# Patient Record
Sex: Male | Born: 1980 | Hispanic: Refuse to answer | Marital: Married | Smoking: Current every day smoker
Health system: Southern US, Community
[De-identification: ages and names within clinical notes are randomized; demographics above are authoritative.]

## PROBLEM LIST (undated history)

## (undated) DIAGNOSIS — F32A Depression, unspecified: Secondary | ICD-10-CM

## (undated) DIAGNOSIS — G839 Paralytic syndrome, unspecified: Secondary | ICD-10-CM

## (undated) DIAGNOSIS — G0490 Encephalitis and encephalomyelitis, unspecified: Secondary | ICD-10-CM

## (undated) DIAGNOSIS — I639 Cerebral infarction, unspecified: Secondary | ICD-10-CM

## (undated) DIAGNOSIS — F329 Major depressive disorder, single episode, unspecified: Secondary | ICD-10-CM

## (undated) DIAGNOSIS — G039 Meningitis, unspecified: Secondary | ICD-10-CM

## (undated) DIAGNOSIS — I10 Essential (primary) hypertension: Secondary | ICD-10-CM

## (undated) HISTORY — PX: JOINT REPLACEMENT: SHX530

## (undated) HISTORY — PX: KNEE SURGERY: SHX244

## (undated) NOTE — *Deleted (*Deleted)
Family Medicine Teaching [facility] Admission History and Physical Service Pager: [phone]  Patient name: [name] Medical record number: [id number] Date of birth: 1980-11-27 Age: 40 y.o. Gender: male  Primary Care Provider: Verlon Au, MD Consultants: *** Code Status: *** Preferred Emergency Contact: ***  Chief Complaint: ***  Assessment and Plan: [name] is a 53 y.o. male presenting with *** . PMH is significant for ***  ***  FEN/GI: *** Prophylaxis: ***  Disposition: ***  History of Present Illness:  [name] is a 36 y.o. male presenting with ***  Pt started having symptoms three years ago.   Draining puss since 3am this morning.  He woke up in significant pain. Pt has not had any fevers or chills at home.  Denies headache, n/v.  endorrses constipation. Pain is 6/10 currently.     Pt loses his balance and falls. Last fell two months ago.     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    Takes tylenol as needed. Dr. Leavy Cella prescribes his lisinorpil for HTN  Has HTN but   Pt states he has had 13 knee surgeries, 9 here at [facility] for infections and other things.    Currently staying with 'godsister'.  Lives in an apartment.  He drinks moonshine 'PRN", aka once or twice a year.  He smokes 3-4 cigarettes daily for 25 years.  He smokes marijuana every other day.   Review Of Systems: Per HPI with the following additions: ***  ROS  Patient Active Problem List   Diagnosis Date Noted  . Leukocytosis 11/17/2018  . Osteomyelitis (HCC) 11/16/2018  . HCAP (healthcare-associated pneumonia) 06/28/2013  . Chest pain, atypical 06/28/2013  . SIRS (systemic inflammatory response syndrome) (HCC) 06/28/2013  . Hyponatremia 06/28/2013  . Headache 01/05/2013  . Leg pain 01/05/2013  . Dizziness 01/05/2013  . 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 CIGARETTE SMOKER 06/10/2007  . DEPRESSION 06/10/2007  .  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 POSTTRAUMATIC WOUND INFECTION NEC 06/10/2007    Past Medical History: Past Medical History:  Diagnosis Date  . Depression   . Encephalitis   . Hypertension   . Meningitis   . 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 Stroke [facility])     Past Surgical History: Past Surgical History:  Procedure Laterality Date  . JOINT REPLACEMENT    . KNEE SURGERY      Social History: Social History   Tobacco Use  . Smoking status: Current Every Day Smoker    Packs/day: 0.25  . Smokeless tobacco: Never Used  Vaping Use  . Vaping Use: Never used  Substance Use Topics  . Alcohol use: No  . Drug use: Yes    Types: Marijuana    Comment: not used in 3 weeks   Additional social history: ***  Please also refer to relevant sections of EMR.  Family History: Family History  Problem Relation Age of Onset  . Hypertension Mother   . Hypertension Father    (***If not completed, MUST add something in)  Allergies and Medications: Allergies  Allergen Reactions  . Vancomycin Hives    Itching and body hives within minutes of initiating Vancomycin infusion  . Ciprofloxacin Other (See Comments)    Reaction=hypertension  . Hydrocodone-Acetaminophen Hives  . Ibuprofen Hives  . Penicillins Hives and Nausea Only    Did it involve swelling of  the face/tongue/throat, SOB, or low BP? N Did it involve sudden or severe rash/hives, skin peeling, or any reaction on the inside of your mouth or nose? N Did you need to seek medical attention at a hospital or doctor's office? N When did it last happen?3 months ago If all above answers are "NO", may proceed with cephalosporin use.   Marland Kitchen Propoxyphene N-Acetaminophen Hives  . Tramadol Hcl Hives   No current facility-administered medications on file prior to encounter.   Current Outpatient Medications on File Prior to  Encounter  Medication Sig Dispense Refill  . amoxicillin-clavulanate (AUGMENTIN) 875-125 MG tablet Take 1 tablet by mouth every 12 (twelve) hours. (Patient not taking: Reported on 11/16/2018) 14 tablet 0  . cyclobenzaprine (FLEXERIL) 10 MG tablet Take 10 mg by mouth daily as needed for muscle spasms.     Marland Kitchen lisinopril (ZESTRIL) 10 MG tablet Take 10 mg by mouth daily.     . naproxen sodium (ANAPROX) 220 MG tablet Take 440 mg by mouth 2 (two) times daily as needed. For pain    . oxybutynin (DITROPAN) 5 MG tablet Take 5 mg by mouth daily.     . penicillin v potassium (VEETID) 500 MG tablet Take 1 tablet (500 mg total) by mouth 4 (four) times daily. (Patient not taking: Reported on 11/16/2018) 40 tablet 0    Objective: BP 101/73 (BP Location: Right Arm)   Pulse (!) 106   Temp 99.9 F (37.7 C) (Oral)   Resp 18   Ht 6\' 1"  (1.854 m)   Wt 89.4 kg   SpO2 98%   BMI 26.00 kg/m  Exam: General: *** Eyes: *** ENTM: *** Neck: *** Cardiovascular: *** Respiratory: *** Gastrointestinal: *** MSK: *** Derm: *** Neuro: *** Psych: ***  Labs and Imaging: CBC BMET  Recent Labs  Lab 01/06/20 1116  WBC 28.2*  HGB 15.1  HCT 45.3  PLT 444*   Recent Labs  Lab 01/06/20 1116  NA 135  K 3.9  CL 97*  CO2 25  BUN 11  CREATININE 1.00  GLUCOSE 107*  CALCIUM 9.8     EKG: ***  ***  Sandre Kitty, MD 01/06/2020, 3:46 PM PGY-***, [facility] Family Medicine FPTS Intern pager: [phone], text pages welcome

---
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
## 1999-08-06 ENCOUNTER — Emergency Department (HOSPITAL_COMMUNITY): Admission: EM | Admit: 1999-08-06 | Discharge: 1999-08-06 | Payer: Self-pay | Admitting: Emergency Medicine

## 1999-08-06 ENCOUNTER — Encounter: Payer: Self-pay | Admitting: Emergency Medicine

## 1999-08-21 ENCOUNTER — Emergency Department (HOSPITAL_COMMUNITY): Admission: EM | Admit: 1999-08-21 | Discharge: 1999-08-22 | Payer: Self-pay | Admitting: Emergency Medicine

## 1999-09-19 ENCOUNTER — Emergency Department (HOSPITAL_COMMUNITY): Admission: EM | Admit: 1999-09-19 | Discharge: 1999-09-19 | Payer: Self-pay | Admitting: Emergency Medicine

## 1999-09-19 ENCOUNTER — Encounter: Payer: Self-pay | Admitting: Emergency Medicine

## 1999-10-27 ENCOUNTER — Emergency Department (HOSPITAL_COMMUNITY): Admission: EM | Admit: 1999-10-27 | Discharge: 1999-10-27 | Payer: Self-pay | Admitting: *Deleted

## 2000-10-16 ENCOUNTER — Emergency Department (HOSPITAL_COMMUNITY): Admission: EM | Admit: 2000-10-16 | Discharge: 2000-10-16 | Payer: Self-pay | Admitting: Emergency Medicine

## 2000-10-16 ENCOUNTER — Encounter: Payer: Self-pay | Admitting: Emergency Medicine

## 2000-10-19 ENCOUNTER — Encounter: Payer: Self-pay | Admitting: Emergency Medicine

## 2000-10-19 ENCOUNTER — Emergency Department (HOSPITAL_COMMUNITY): Admission: EM | Admit: 2000-10-19 | Discharge: 2000-10-19 | Payer: Self-pay | Admitting: Emergency Medicine

## 2000-10-21 ENCOUNTER — Encounter (INDEPENDENT_AMBULATORY_CARE_PROVIDER_SITE_OTHER): Payer: Self-pay | Admitting: Specialist

## 2000-10-21 ENCOUNTER — Ambulatory Visit (HOSPITAL_BASED_OUTPATIENT_CLINIC_OR_DEPARTMENT_OTHER): Admission: RE | Admit: 2000-10-21 | Discharge: 2000-10-21 | Payer: Self-pay | Admitting: General Surgery

## 2000-10-22 ENCOUNTER — Emergency Department (HOSPITAL_COMMUNITY): Admission: EM | Admit: 2000-10-22 | Discharge: 2000-10-22 | Payer: Self-pay | Admitting: Emergency Medicine

## 2000-10-23 ENCOUNTER — Emergency Department (HOSPITAL_COMMUNITY): Admission: EM | Admit: 2000-10-23 | Discharge: 2000-10-23 | Payer: Self-pay

## 2000-10-24 ENCOUNTER — Emergency Department (HOSPITAL_COMMUNITY): Admission: EM | Admit: 2000-10-24 | Discharge: 2000-10-24 | Payer: Self-pay | Admitting: Emergency Medicine

## 2001-06-30 ENCOUNTER — Emergency Department (HOSPITAL_COMMUNITY): Admission: EM | Admit: 2001-06-30 | Discharge: 2001-06-30 | Payer: Self-pay | Admitting: Emergency Medicine

## 2001-07-01 ENCOUNTER — Encounter: Payer: Self-pay | Admitting: Emergency Medicine

## 2001-07-28 ENCOUNTER — Emergency Department (HOSPITAL_COMMUNITY): Admission: EM | Admit: 2001-07-28 | Discharge: 2001-07-28 | Payer: Self-pay | Admitting: Emergency Medicine

## 2002-04-07 ENCOUNTER — Inpatient Hospital Stay (HOSPITAL_COMMUNITY): Admission: EM | Admit: 2002-04-07 | Discharge: 2002-04-13 | Payer: Self-pay | Admitting: Psychiatry

## 2002-07-20 ENCOUNTER — Emergency Department (HOSPITAL_COMMUNITY): Admission: EM | Admit: 2002-07-20 | Discharge: 2002-07-20 | Payer: Self-pay | Admitting: Emergency Medicine

## 2003-01-13 ENCOUNTER — Ambulatory Visit (HOSPITAL_COMMUNITY): Admission: RE | Admit: 2003-01-13 | Discharge: 2003-01-13 | Payer: Self-pay | Admitting: Orthopedic Surgery

## 2003-01-13 ENCOUNTER — Encounter (INDEPENDENT_AMBULATORY_CARE_PROVIDER_SITE_OTHER): Payer: Self-pay | Admitting: *Deleted

## 2003-01-13 ENCOUNTER — Ambulatory Visit (HOSPITAL_BASED_OUTPATIENT_CLINIC_OR_DEPARTMENT_OTHER): Admission: RE | Admit: 2003-01-13 | Discharge: 2003-01-13 | Payer: Self-pay | Admitting: Orthopedic Surgery

## 2003-03-19 ENCOUNTER — Emergency Department (HOSPITAL_COMMUNITY): Admission: EM | Admit: 2003-03-19 | Discharge: 2003-03-19 | Payer: Self-pay | Admitting: Emergency Medicine

## 2003-09-29 ENCOUNTER — Inpatient Hospital Stay (HOSPITAL_COMMUNITY): Admission: RE | Admit: 2003-09-29 | Discharge: 2003-10-03 | Payer: Self-pay | Admitting: Orthopedic Surgery

## 2003-09-29 ENCOUNTER — Encounter (INDEPENDENT_AMBULATORY_CARE_PROVIDER_SITE_OTHER): Payer: Self-pay | Admitting: Specialist

## 2003-12-01 ENCOUNTER — Encounter: Admission: RE | Admit: 2003-12-01 | Discharge: 2003-12-01 | Payer: Self-pay | Admitting: Family Medicine

## 2003-12-11 ENCOUNTER — Emergency Department (HOSPITAL_COMMUNITY): Admission: EM | Admit: 2003-12-11 | Discharge: 2003-12-11 | Payer: Self-pay | Admitting: Emergency Medicine

## 2003-12-27 ENCOUNTER — Emergency Department (HOSPITAL_COMMUNITY): Admission: EM | Admit: 2003-12-27 | Discharge: 2003-12-27 | Payer: Self-pay | Admitting: Emergency Medicine

## 2004-02-24 ENCOUNTER — Emergency Department (HOSPITAL_COMMUNITY): Admission: EM | Admit: 2004-02-24 | Discharge: 2004-02-25 | Payer: Self-pay

## 2004-03-07 ENCOUNTER — Emergency Department (HOSPITAL_COMMUNITY): Admission: EM | Admit: 2004-03-07 | Discharge: 2004-03-07 | Payer: Self-pay | Admitting: Emergency Medicine

## 2004-05-15 ENCOUNTER — Ambulatory Visit (HOSPITAL_COMMUNITY): Admission: RE | Admit: 2004-05-15 | Discharge: 2004-05-16 | Payer: Self-pay | Admitting: Orthopaedic Surgery

## 2004-05-21 ENCOUNTER — Emergency Department (HOSPITAL_COMMUNITY): Admission: EM | Admit: 2004-05-21 | Discharge: 2004-05-21 | Payer: Self-pay | Admitting: Emergency Medicine

## 2004-05-27 ENCOUNTER — Emergency Department (HOSPITAL_COMMUNITY): Admission: EM | Admit: 2004-05-27 | Discharge: 2004-05-27 | Payer: Self-pay | Admitting: Emergency Medicine

## 2004-05-29 ENCOUNTER — Emergency Department (HOSPITAL_COMMUNITY): Admission: EM | Admit: 2004-05-29 | Discharge: 2004-05-29 | Payer: Self-pay | Admitting: Emergency Medicine

## 2004-06-05 ENCOUNTER — Emergency Department (HOSPITAL_COMMUNITY): Admission: EM | Admit: 2004-06-05 | Discharge: 2004-06-06 | Payer: Self-pay | Admitting: Emergency Medicine

## 2004-06-07 ENCOUNTER — Inpatient Hospital Stay (HOSPITAL_COMMUNITY): Admission: EM | Admit: 2004-06-07 | Discharge: 2004-06-10 | Payer: Self-pay | Admitting: Emergency Medicine

## 2004-06-14 ENCOUNTER — Emergency Department (HOSPITAL_COMMUNITY): Admission: EM | Admit: 2004-06-14 | Discharge: 2004-06-14 | Payer: Self-pay | Admitting: Emergency Medicine

## 2004-06-23 ENCOUNTER — Emergency Department (HOSPITAL_COMMUNITY): Admission: EM | Admit: 2004-06-23 | Discharge: 2004-06-23 | Payer: Self-pay | Admitting: Emergency Medicine

## 2004-07-20 ENCOUNTER — Emergency Department (HOSPITAL_COMMUNITY): Admission: EM | Admit: 2004-07-20 | Discharge: 2004-07-21 | Payer: Self-pay | Admitting: Emergency Medicine

## 2004-08-03 ENCOUNTER — Emergency Department (HOSPITAL_COMMUNITY): Admission: EM | Admit: 2004-08-03 | Discharge: 2004-08-03 | Payer: Self-pay | Admitting: Family Medicine

## 2004-08-04 ENCOUNTER — Emergency Department (HOSPITAL_COMMUNITY): Admission: EM | Admit: 2004-08-04 | Discharge: 2004-08-04 | Payer: Self-pay | Admitting: Emergency Medicine

## 2004-08-06 ENCOUNTER — Emergency Department (HOSPITAL_COMMUNITY): Admission: EM | Admit: 2004-08-06 | Discharge: 2004-08-06 | Payer: Self-pay | Admitting: Family Medicine

## 2004-08-17 ENCOUNTER — Emergency Department (HOSPITAL_COMMUNITY): Admission: EM | Admit: 2004-08-17 | Discharge: 2004-08-17 | Payer: Self-pay | Admitting: Emergency Medicine

## 2004-08-18 ENCOUNTER — Emergency Department (HOSPITAL_COMMUNITY): Admission: EM | Admit: 2004-08-18 | Discharge: 2004-08-18 | Payer: Self-pay | Admitting: Emergency Medicine

## 2004-08-21 ENCOUNTER — Emergency Department (HOSPITAL_COMMUNITY): Admission: EM | Admit: 2004-08-21 | Discharge: 2004-08-21 | Payer: Self-pay | Admitting: Emergency Medicine

## 2004-11-03 ENCOUNTER — Emergency Department (HOSPITAL_COMMUNITY): Admission: EM | Admit: 2004-11-03 | Discharge: 2004-11-03 | Payer: Self-pay | Admitting: Family Medicine

## 2004-11-22 ENCOUNTER — Emergency Department (HOSPITAL_COMMUNITY): Admission: EM | Admit: 2004-11-22 | Discharge: 2004-11-23 | Payer: Self-pay | Admitting: Emergency Medicine

## 2004-12-06 ENCOUNTER — Emergency Department (HOSPITAL_COMMUNITY): Admission: EM | Admit: 2004-12-06 | Discharge: 2004-12-06 | Payer: Self-pay | Admitting: Emergency Medicine

## 2004-12-23 ENCOUNTER — Emergency Department (HOSPITAL_COMMUNITY): Admission: EM | Admit: 2004-12-23 | Discharge: 2004-12-23 | Payer: Self-pay | Admitting: Emergency Medicine

## 2005-01-25 ENCOUNTER — Emergency Department (HOSPITAL_COMMUNITY): Admission: EM | Admit: 2005-01-25 | Discharge: 2005-01-25 | Payer: Self-pay | Admitting: Emergency Medicine

## 2005-03-03 ENCOUNTER — Emergency Department (HOSPITAL_COMMUNITY): Admission: EM | Admit: 2005-03-03 | Discharge: 2005-03-04 | Payer: Self-pay | Admitting: Emergency Medicine

## 2005-03-29 ENCOUNTER — Emergency Department (HOSPITAL_COMMUNITY): Admission: EM | Admit: 2005-03-29 | Discharge: 2005-03-29 | Payer: Self-pay | Admitting: Emergency Medicine

## 2005-04-05 ENCOUNTER — Emergency Department (HOSPITAL_COMMUNITY): Admission: EM | Admit: 2005-04-05 | Discharge: 2005-04-05 | Payer: Self-pay | Admitting: Emergency Medicine

## 2005-04-08 ENCOUNTER — Emergency Department (HOSPITAL_COMMUNITY): Admission: EM | Admit: 2005-04-08 | Discharge: 2005-04-08 | Payer: Self-pay | Admitting: Emergency Medicine

## 2005-04-13 ENCOUNTER — Emergency Department (HOSPITAL_COMMUNITY): Admission: EM | Admit: 2005-04-13 | Discharge: 2005-04-13 | Payer: Self-pay | Admitting: Emergency Medicine

## 2005-05-05 ENCOUNTER — Emergency Department (HOSPITAL_COMMUNITY): Admission: EM | Admit: 2005-05-05 | Discharge: 2005-05-05 | Payer: Self-pay | Admitting: Emergency Medicine

## 2005-07-12 ENCOUNTER — Encounter: Admission: RE | Admit: 2005-07-12 | Discharge: 2005-07-12 | Payer: Self-pay | Admitting: Family Medicine

## 2005-07-21 ENCOUNTER — Emergency Department (HOSPITAL_COMMUNITY): Admission: EM | Admit: 2005-07-21 | Discharge: 2005-07-21 | Payer: Self-pay | Admitting: *Deleted

## 2005-07-25 ENCOUNTER — Encounter: Admission: RE | Admit: 2005-07-25 | Discharge: 2005-07-25 | Payer: Self-pay | Admitting: Orthopaedic Surgery

## 2005-08-05 ENCOUNTER — Emergency Department (HOSPITAL_COMMUNITY): Admission: EM | Admit: 2005-08-05 | Discharge: 2005-08-05 | Payer: Self-pay | Admitting: Emergency Medicine

## 2005-12-10 ENCOUNTER — Ambulatory Visit (HOSPITAL_COMMUNITY): Admission: RE | Admit: 2005-12-10 | Discharge: 2005-12-10 | Payer: Self-pay | Admitting: Orthopaedic Surgery

## 2006-05-13 ENCOUNTER — Inpatient Hospital Stay (HOSPITAL_COMMUNITY): Admission: RE | Admit: 2006-05-13 | Discharge: 2006-05-19 | Payer: Self-pay | Admitting: Orthopaedic Surgery

## 2006-07-08 ENCOUNTER — Emergency Department (HOSPITAL_COMMUNITY): Admission: EM | Admit: 2006-07-08 | Discharge: 2006-07-08 | Payer: Self-pay | Admitting: Emergency Medicine

## 2006-10-22 ENCOUNTER — Emergency Department (HOSPITAL_COMMUNITY): Admission: EM | Admit: 2006-10-22 | Discharge: 2006-10-22 | Payer: Self-pay | Admitting: Emergency Medicine

## 2006-10-26 ENCOUNTER — Emergency Department (HOSPITAL_COMMUNITY): Admission: EM | Admit: 2006-10-26 | Discharge: 2006-10-27 | Payer: Self-pay | Admitting: Emergency Medicine

## 2006-10-29 ENCOUNTER — Emergency Department (HOSPITAL_COMMUNITY): Admission: EM | Admit: 2006-10-29 | Discharge: 2006-10-29 | Payer: Self-pay | Admitting: Emergency Medicine

## 2006-10-31 ENCOUNTER — Inpatient Hospital Stay (HOSPITAL_COMMUNITY): Admission: AD | Admit: 2006-10-31 | Discharge: 2006-11-02 | Payer: Self-pay | Admitting: Psychiatry

## 2006-10-31 ENCOUNTER — Ambulatory Visit: Payer: Self-pay | Admitting: Psychiatry

## 2006-11-14 ENCOUNTER — Emergency Department (HOSPITAL_COMMUNITY): Admission: EM | Admit: 2006-11-14 | Discharge: 2006-11-14 | Payer: Self-pay | Admitting: Emergency Medicine

## 2006-11-18 ENCOUNTER — Observation Stay (HOSPITAL_COMMUNITY): Admission: RE | Admit: 2006-11-18 | Discharge: 2006-11-19 | Payer: Self-pay | Admitting: Orthopaedic Surgery

## 2006-11-21 ENCOUNTER — Emergency Department (HOSPITAL_COMMUNITY): Admission: EM | Admit: 2006-11-21 | Discharge: 2006-11-21 | Payer: Self-pay | Admitting: Emergency Medicine

## 2006-11-25 ENCOUNTER — Emergency Department (HOSPITAL_COMMUNITY): Admission: EM | Admit: 2006-11-25 | Discharge: 2006-11-26 | Payer: Self-pay | Admitting: Emergency Medicine

## 2006-11-29 ENCOUNTER — Emergency Department (HOSPITAL_COMMUNITY): Admission: EM | Admit: 2006-11-29 | Discharge: 2006-11-30 | Payer: Self-pay | Admitting: Emergency Medicine

## 2006-11-29 ENCOUNTER — Emergency Department (HOSPITAL_COMMUNITY): Admission: EM | Admit: 2006-11-29 | Discharge: 2006-11-29 | Payer: Self-pay | Admitting: Emergency Medicine

## 2006-11-30 ENCOUNTER — Emergency Department (HOSPITAL_COMMUNITY): Admission: EM | Admit: 2006-11-30 | Discharge: 2006-11-30 | Payer: Self-pay | Admitting: Emergency Medicine

## 2006-12-11 ENCOUNTER — Emergency Department (HOSPITAL_COMMUNITY): Admission: EM | Admit: 2006-12-11 | Discharge: 2006-12-11 | Payer: Self-pay | Admitting: Emergency Medicine

## 2006-12-13 ENCOUNTER — Observation Stay (HOSPITAL_COMMUNITY): Admission: EM | Admit: 2006-12-13 | Discharge: 2006-12-13 | Payer: Self-pay | Admitting: Emergency Medicine

## 2006-12-15 ENCOUNTER — Emergency Department (HOSPITAL_COMMUNITY): Admission: EM | Admit: 2006-12-15 | Discharge: 2006-12-15 | Payer: Self-pay | Admitting: Emergency Medicine

## 2006-12-21 ENCOUNTER — Emergency Department (HOSPITAL_COMMUNITY): Admission: EM | Admit: 2006-12-21 | Discharge: 2006-12-21 | Payer: Self-pay | Admitting: Emergency Medicine

## 2006-12-22 ENCOUNTER — Emergency Department (HOSPITAL_COMMUNITY): Admission: EM | Admit: 2006-12-22 | Discharge: 2006-12-23 | Payer: Self-pay | Admitting: Emergency Medicine

## 2006-12-22 ENCOUNTER — Emergency Department (HOSPITAL_COMMUNITY): Admission: EM | Admit: 2006-12-22 | Discharge: 2006-12-22 | Payer: Self-pay | Admitting: Emergency Medicine

## 2006-12-23 ENCOUNTER — Emergency Department (HOSPITAL_COMMUNITY): Admission: EM | Admit: 2006-12-23 | Discharge: 2006-12-23 | Payer: Self-pay | Admitting: Emergency Medicine

## 2006-12-25 ENCOUNTER — Emergency Department (HOSPITAL_COMMUNITY): Admission: EM | Admit: 2006-12-25 | Discharge: 2006-12-25 | Payer: Self-pay | Admitting: Emergency Medicine

## 2006-12-29 ENCOUNTER — Emergency Department (HOSPITAL_COMMUNITY): Admission: EM | Admit: 2006-12-29 | Discharge: 2006-12-29 | Payer: Self-pay | Admitting: Emergency Medicine

## 2007-01-02 ENCOUNTER — Emergency Department (HOSPITAL_COMMUNITY): Admission: EM | Admit: 2007-01-02 | Discharge: 2007-01-03 | Payer: Self-pay | Admitting: Emergency Medicine

## 2007-01-09 ENCOUNTER — Emergency Department (HOSPITAL_COMMUNITY): Admission: EM | Admit: 2007-01-09 | Discharge: 2007-01-09 | Payer: Self-pay | Admitting: *Deleted

## 2007-01-11 ENCOUNTER — Emergency Department (HOSPITAL_COMMUNITY): Admission: EM | Admit: 2007-01-11 | Discharge: 2007-01-11 | Payer: Self-pay | Admitting: Emergency Medicine

## 2007-01-16 ENCOUNTER — Emergency Department (HOSPITAL_COMMUNITY): Admission: EM | Admit: 2007-01-16 | Discharge: 2007-01-16 | Payer: Self-pay | Admitting: Emergency Medicine

## 2007-01-19 ENCOUNTER — Emergency Department (HOSPITAL_COMMUNITY): Admission: EM | Admit: 2007-01-19 | Discharge: 2007-01-20 | Payer: Self-pay | Admitting: Family Medicine

## 2007-01-20 ENCOUNTER — Emergency Department (HOSPITAL_COMMUNITY): Admission: EM | Admit: 2007-01-20 | Discharge: 2007-01-20 | Payer: Self-pay | Admitting: Emergency Medicine

## 2007-01-23 ENCOUNTER — Emergency Department (HOSPITAL_COMMUNITY): Admission: EM | Admit: 2007-01-23 | Discharge: 2007-01-23 | Payer: Self-pay | Admitting: Emergency Medicine

## 2007-01-29 ENCOUNTER — Emergency Department (HOSPITAL_COMMUNITY): Admission: EM | Admit: 2007-01-29 | Discharge: 2007-01-29 | Payer: Self-pay | Admitting: Emergency Medicine

## 2007-01-30 ENCOUNTER — Emergency Department (HOSPITAL_COMMUNITY): Admission: EM | Admit: 2007-01-30 | Discharge: 2007-01-30 | Payer: Self-pay | Admitting: Emergency Medicine

## 2007-02-01 ENCOUNTER — Other Ambulatory Visit: Payer: Self-pay | Admitting: Emergency Medicine

## 2007-02-01 ENCOUNTER — Other Ambulatory Visit: Payer: Self-pay | Admitting: *Deleted

## 2007-02-02 ENCOUNTER — Ambulatory Visit: Payer: Self-pay | Admitting: *Deleted

## 2007-02-02 ENCOUNTER — Inpatient Hospital Stay (HOSPITAL_COMMUNITY): Admission: EM | Admit: 2007-02-02 | Discharge: 2007-02-05 | Payer: Self-pay | Admitting: *Deleted

## 2007-02-11 ENCOUNTER — Emergency Department (HOSPITAL_COMMUNITY): Admission: EM | Admit: 2007-02-11 | Discharge: 2007-02-11 | Payer: Self-pay | Admitting: Emergency Medicine

## 2007-02-15 ENCOUNTER — Emergency Department (HOSPITAL_COMMUNITY): Admission: EM | Admit: 2007-02-15 | Discharge: 2007-02-15 | Payer: Self-pay | Admitting: Emergency Medicine

## 2007-02-16 ENCOUNTER — Other Ambulatory Visit: Payer: Self-pay | Admitting: Emergency Medicine

## 2007-02-16 ENCOUNTER — Inpatient Hospital Stay (HOSPITAL_COMMUNITY): Admission: EM | Admit: 2007-02-16 | Discharge: 2007-02-17 | Payer: Self-pay | Admitting: *Deleted

## 2007-02-16 ENCOUNTER — Ambulatory Visit: Payer: Self-pay | Admitting: *Deleted

## 2007-02-18 ENCOUNTER — Emergency Department (HOSPITAL_COMMUNITY): Admission: EM | Admit: 2007-02-18 | Discharge: 2007-02-18 | Payer: Self-pay | Admitting: Emergency Medicine

## 2007-02-22 ENCOUNTER — Emergency Department (HOSPITAL_COMMUNITY): Admission: EM | Admit: 2007-02-22 | Discharge: 2007-02-22 | Payer: Self-pay | Admitting: Emergency Medicine

## 2007-02-23 ENCOUNTER — Emergency Department (HOSPITAL_COMMUNITY): Admission: EM | Admit: 2007-02-23 | Discharge: 2007-02-23 | Payer: Self-pay | Admitting: Emergency Medicine

## 2007-02-26 ENCOUNTER — Emergency Department (HOSPITAL_COMMUNITY): Admission: EM | Admit: 2007-02-26 | Discharge: 2007-02-26 | Payer: Self-pay | Admitting: Emergency Medicine

## 2007-03-04 ENCOUNTER — Emergency Department (HOSPITAL_COMMUNITY): Admission: EM | Admit: 2007-03-04 | Discharge: 2007-03-04 | Payer: Self-pay | Admitting: Emergency Medicine

## 2007-03-06 ENCOUNTER — Emergency Department (HOSPITAL_COMMUNITY): Admission: EM | Admit: 2007-03-06 | Discharge: 2007-03-06 | Payer: Self-pay | Admitting: Emergency Medicine

## 2007-03-10 ENCOUNTER — Emergency Department (HOSPITAL_COMMUNITY): Admission: EM | Admit: 2007-03-10 | Discharge: 2007-03-10 | Payer: Self-pay | Admitting: Emergency Medicine

## 2007-03-27 ENCOUNTER — Emergency Department (HOSPITAL_COMMUNITY): Admission: EM | Admit: 2007-03-27 | Discharge: 2007-03-27 | Payer: Self-pay | Admitting: Emergency Medicine

## 2007-04-08 ENCOUNTER — Emergency Department (HOSPITAL_COMMUNITY): Admission: EM | Admit: 2007-04-08 | Discharge: 2007-04-08 | Payer: Self-pay | Admitting: Emergency Medicine

## 2007-04-15 ENCOUNTER — Emergency Department (HOSPITAL_COMMUNITY): Admission: EM | Admit: 2007-04-15 | Discharge: 2007-04-15 | Payer: Self-pay | Admitting: Emergency Medicine

## 2007-04-28 ENCOUNTER — Encounter (HOSPITAL_BASED_OUTPATIENT_CLINIC_OR_DEPARTMENT_OTHER): Admission: RE | Admit: 2007-04-28 | Discharge: 2007-07-27 | Payer: Self-pay | Admitting: Surgery

## 2007-04-30 ENCOUNTER — Encounter: Payer: Self-pay | Admitting: Internal Medicine

## 2007-05-01 ENCOUNTER — Emergency Department (HOSPITAL_COMMUNITY): Admission: EM | Admit: 2007-05-01 | Discharge: 2007-05-01 | Payer: Self-pay | Admitting: Emergency Medicine

## 2007-05-11 ENCOUNTER — Encounter: Payer: Self-pay | Admitting: Internal Medicine

## 2007-05-14 ENCOUNTER — Encounter: Payer: Self-pay | Admitting: Internal Medicine

## 2007-05-19 ENCOUNTER — Emergency Department (HOSPITAL_COMMUNITY): Admission: EM | Admit: 2007-05-19 | Discharge: 2007-05-19 | Payer: Self-pay | Admitting: Emergency Medicine

## 2007-05-20 ENCOUNTER — Emergency Department (HOSPITAL_COMMUNITY): Admission: EM | Admit: 2007-05-20 | Discharge: 2007-05-20 | Payer: Self-pay | Admitting: Emergency Medicine

## 2007-05-26 ENCOUNTER — Emergency Department (HOSPITAL_COMMUNITY): Admission: EM | Admit: 2007-05-26 | Discharge: 2007-05-26 | Payer: Self-pay | Admitting: Emergency Medicine

## 2007-05-28 ENCOUNTER — Encounter: Admission: RE | Admit: 2007-05-28 | Discharge: 2007-05-28 | Payer: Self-pay | Admitting: Surgery

## 2007-05-30 ENCOUNTER — Emergency Department (HOSPITAL_COMMUNITY): Admission: EM | Admit: 2007-05-30 | Discharge: 2007-05-30 | Payer: Self-pay | Admitting: Emergency Medicine

## 2007-06-04 ENCOUNTER — Emergency Department (HOSPITAL_COMMUNITY): Admission: EM | Admit: 2007-06-04 | Discharge: 2007-06-04 | Payer: Self-pay | Admitting: Emergency Medicine

## 2007-06-06 ENCOUNTER — Emergency Department (HOSPITAL_COMMUNITY): Admission: EM | Admit: 2007-06-06 | Discharge: 2007-06-07 | Payer: Self-pay | Admitting: Emergency Medicine

## 2007-06-08 ENCOUNTER — Emergency Department (HOSPITAL_COMMUNITY): Admission: EM | Admit: 2007-06-08 | Discharge: 2007-06-08 | Payer: Self-pay | Admitting: Emergency Medicine

## 2007-06-10 ENCOUNTER — Ambulatory Visit: Payer: Self-pay | Admitting: Internal Medicine

## 2007-06-10 DIAGNOSIS — G8110 Spastic hemiplegia affecting unspecified side: Secondary | ICD-10-CM | POA: Insufficient documentation

## 2007-06-10 DIAGNOSIS — G0491 Myelitis, unspecified: Secondary | ICD-10-CM

## 2007-06-10 DIAGNOSIS — F32A Depression, unspecified: Secondary | ICD-10-CM | POA: Insufficient documentation

## 2007-06-10 DIAGNOSIS — F17200 Nicotine dependence, unspecified, uncomplicated: Secondary | ICD-10-CM | POA: Insufficient documentation

## 2007-06-10 DIAGNOSIS — F1011 Alcohol abuse, in remission: Secondary | ICD-10-CM | POA: Insufficient documentation

## 2007-06-10 DIAGNOSIS — L089 Local infection of the skin and subcutaneous tissue, unspecified: Secondary | ICD-10-CM | POA: Insufficient documentation

## 2007-06-10 DIAGNOSIS — T148XXA Other injury of unspecified body region, initial encounter: Secondary | ICD-10-CM

## 2007-06-10 DIAGNOSIS — G0490 Encephalitis and encephalomyelitis, unspecified: Secondary | ICD-10-CM | POA: Insufficient documentation

## 2007-06-10 DIAGNOSIS — F329 Major depressive disorder, single episode, unspecified: Secondary | ICD-10-CM

## 2007-06-23 ENCOUNTER — Emergency Department: Payer: Self-pay | Admitting: Emergency Medicine

## 2007-06-23 ENCOUNTER — Emergency Department (HOSPITAL_COMMUNITY): Admission: EM | Admit: 2007-06-23 | Discharge: 2007-06-23 | Payer: Self-pay | Admitting: Emergency Medicine

## 2007-06-27 ENCOUNTER — Emergency Department (HOSPITAL_COMMUNITY): Admission: EM | Admit: 2007-06-27 | Discharge: 2007-06-27 | Payer: Self-pay | Admitting: Emergency Medicine

## 2007-07-06 ENCOUNTER — Emergency Department (HOSPITAL_COMMUNITY): Admission: EM | Admit: 2007-07-06 | Discharge: 2007-07-06 | Payer: Self-pay | Admitting: Emergency Medicine

## 2007-07-24 ENCOUNTER — Emergency Department (HOSPITAL_COMMUNITY): Admission: EM | Admit: 2007-07-24 | Discharge: 2007-07-24 | Payer: Self-pay | Admitting: Emergency Medicine

## 2007-07-28 ENCOUNTER — Ambulatory Visit: Payer: Self-pay | Admitting: Internal Medicine

## 2007-07-28 LAB — CONVERTED CEMR LAB
Bilirubin Urine: NEGATIVE
Glucose, Urine, Semiquant: NEGATIVE
WBC Urine, dipstick: NEGATIVE
pH: 5

## 2007-08-08 ENCOUNTER — Emergency Department (HOSPITAL_COMMUNITY): Admission: EM | Admit: 2007-08-08 | Discharge: 2007-08-08 | Payer: Self-pay | Admitting: Emergency Medicine

## 2007-08-10 ENCOUNTER — Emergency Department (HOSPITAL_COMMUNITY): Admission: EM | Admit: 2007-08-10 | Discharge: 2007-08-10 | Payer: Self-pay | Admitting: Emergency Medicine

## 2007-08-13 ENCOUNTER — Emergency Department (HOSPITAL_COMMUNITY): Admission: EM | Admit: 2007-08-13 | Discharge: 2007-08-13 | Payer: Self-pay | Admitting: Emergency Medicine

## 2007-08-19 ENCOUNTER — Emergency Department (HOSPITAL_COMMUNITY): Admission: EM | Admit: 2007-08-19 | Discharge: 2007-08-19 | Payer: Self-pay | Admitting: Emergency Medicine

## 2007-09-12 ENCOUNTER — Emergency Department (HOSPITAL_COMMUNITY): Admission: EM | Admit: 2007-09-12 | Discharge: 2007-09-12 | Payer: Self-pay | Admitting: Emergency Medicine

## 2007-09-14 ENCOUNTER — Emergency Department (HOSPITAL_COMMUNITY): Admission: EM | Admit: 2007-09-14 | Discharge: 2007-09-14 | Payer: Self-pay | Admitting: Emergency Medicine

## 2007-09-16 ENCOUNTER — Inpatient Hospital Stay (HOSPITAL_COMMUNITY): Admission: AD | Admit: 2007-09-16 | Discharge: 2007-09-20 | Payer: Self-pay | Admitting: *Deleted

## 2007-09-16 ENCOUNTER — Ambulatory Visit: Payer: Self-pay | Admitting: *Deleted

## 2007-10-13 ENCOUNTER — Emergency Department (HOSPITAL_COMMUNITY): Admission: EM | Admit: 2007-10-13 | Discharge: 2007-10-13 | Payer: Self-pay | Admitting: Emergency Medicine

## 2007-10-17 ENCOUNTER — Inpatient Hospital Stay (HOSPITAL_COMMUNITY): Admission: AD | Admit: 2007-10-17 | Discharge: 2007-10-19 | Payer: Self-pay | Admitting: *Deleted

## 2007-10-17 ENCOUNTER — Ambulatory Visit: Payer: Self-pay | Admitting: *Deleted

## 2007-10-25 ENCOUNTER — Emergency Department (HOSPITAL_COMMUNITY): Admission: EM | Admit: 2007-10-25 | Discharge: 2007-10-25 | Payer: Self-pay | Admitting: Emergency Medicine

## 2007-10-31 ENCOUNTER — Emergency Department (HOSPITAL_COMMUNITY): Admission: EM | Admit: 2007-10-31 | Discharge: 2007-10-31 | Payer: Self-pay | Admitting: *Deleted

## 2007-11-06 ENCOUNTER — Emergency Department (HOSPITAL_COMMUNITY): Admission: EM | Admit: 2007-11-06 | Discharge: 2007-11-06 | Payer: Self-pay | Admitting: Emergency Medicine

## 2007-12-12 ENCOUNTER — Emergency Department (HOSPITAL_COMMUNITY): Admission: EM | Admit: 2007-12-12 | Discharge: 2007-12-13 | Payer: Self-pay | Admitting: Emergency Medicine

## 2008-01-08 ENCOUNTER — Emergency Department (HOSPITAL_COMMUNITY): Admission: EM | Admit: 2008-01-08 | Discharge: 2008-01-08 | Payer: Self-pay | Admitting: Family Medicine

## 2008-01-14 ENCOUNTER — Other Ambulatory Visit: Payer: Self-pay

## 2008-01-15 ENCOUNTER — Ambulatory Visit: Payer: Self-pay | Admitting: *Deleted

## 2008-01-15 ENCOUNTER — Inpatient Hospital Stay (HOSPITAL_COMMUNITY): Admission: AD | Admit: 2008-01-15 | Discharge: 2008-01-19 | Payer: Self-pay | Admitting: *Deleted

## 2008-01-16 IMAGING — CT CT HEAD W/O CM
1 of 2 series · 16 of 30 positions shown, 20 images · IV contrast (agent unspecified)
Comparison: 12/15/06.

CLINICAL DATA: 26-year-old male with syncope, left arm and both leg numbness.  Headache. 
 HEAD CT WITHOUT CONTRAST:
TECHNIQUE: Contiguous axial images were obtained from the base of the skull through the vertex according to standard protocol without contrast.

[Series 102: brain · axial · 0.47mm/px · z∈[+145,+295]mm · 16 of 64 slices shown, 20 images]
[im 4/64  brain]
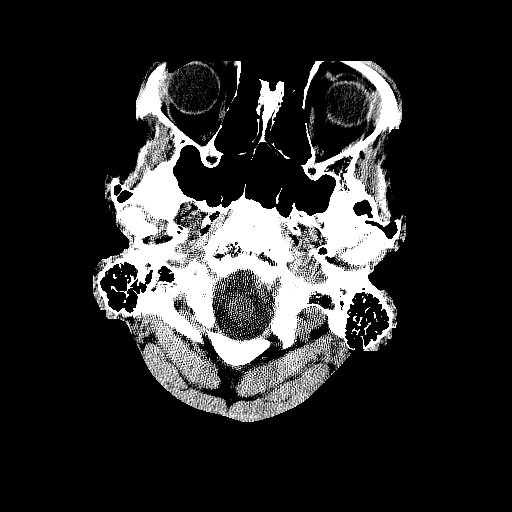
[im 4/64  bone]
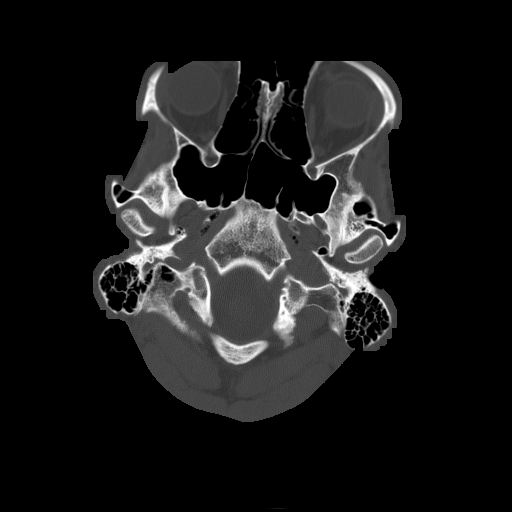
[im 7/64  brain]
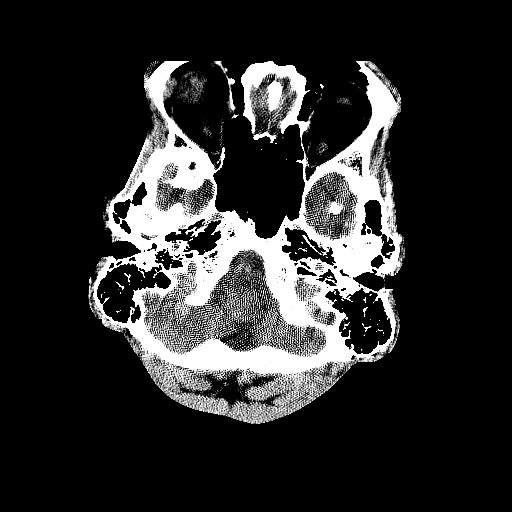
[im 10/64  brain]
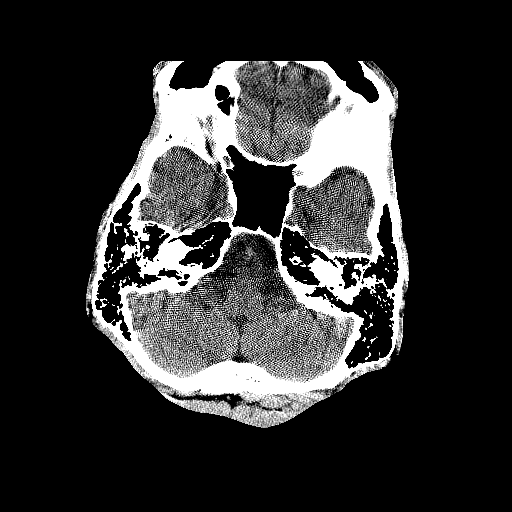
[im 14/64  brain]
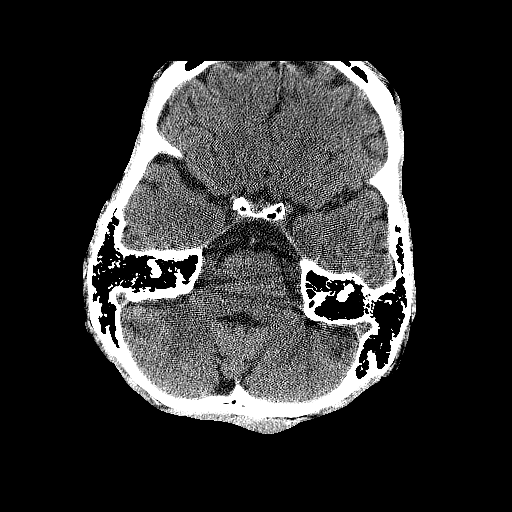
[im 20/64  brain]
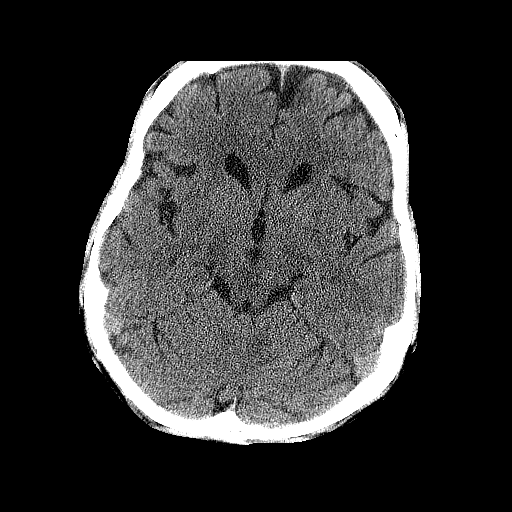
[im 20/64  bone]
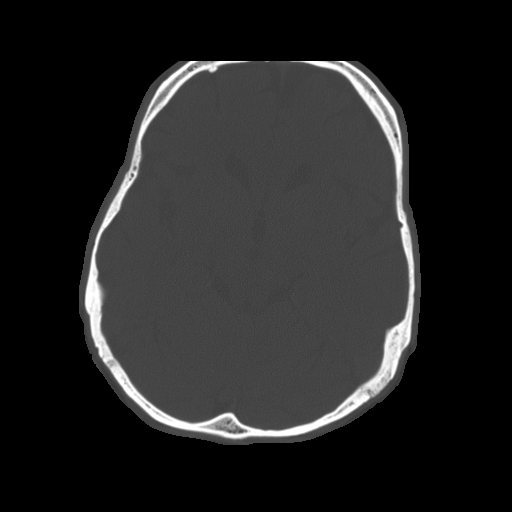
[im 24/64  brain]
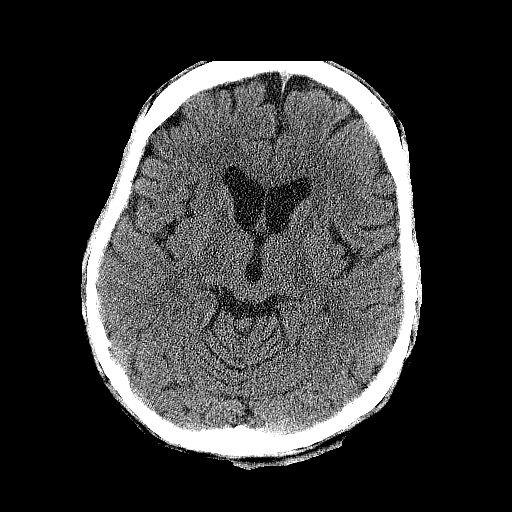
[im 27/64  brain]
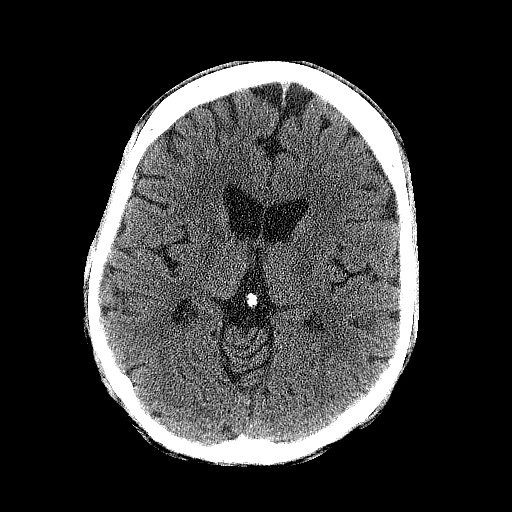
[im 30/64  brain]
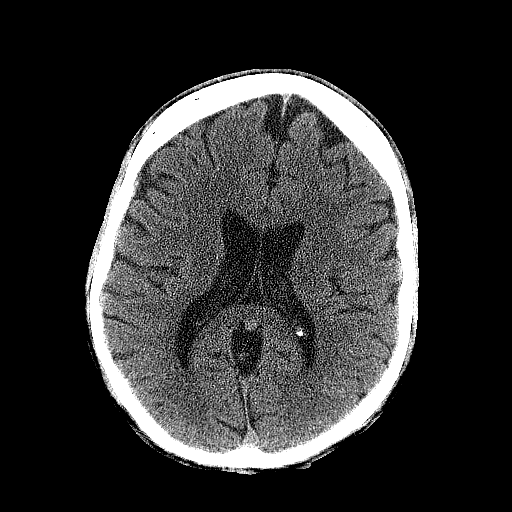
[im 34/64  brain]
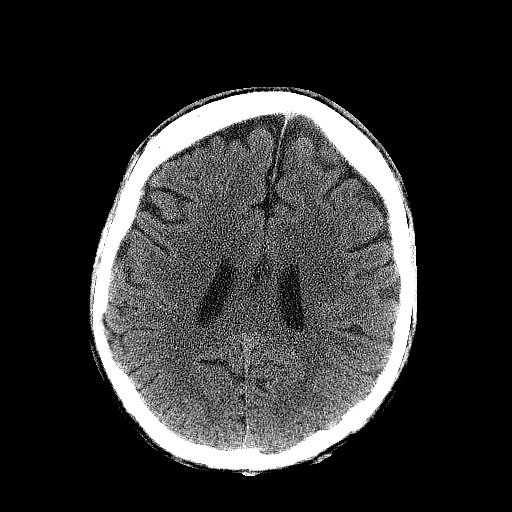
[im 34/64  bone]
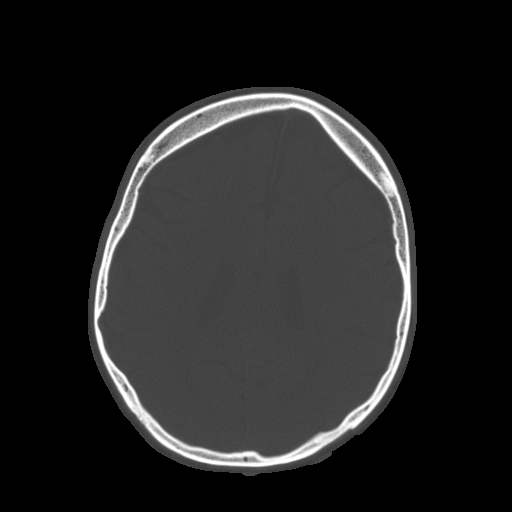
[im 37/64  brain]
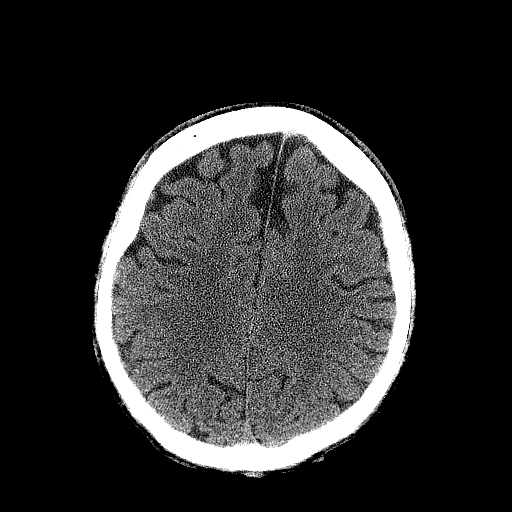
[im 40/64  brain]
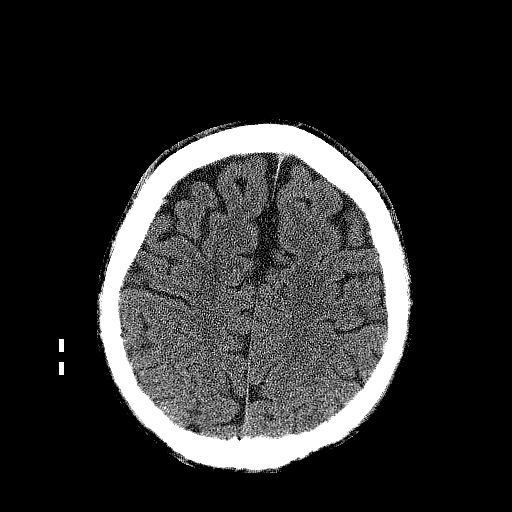
[im 44/64  brain]
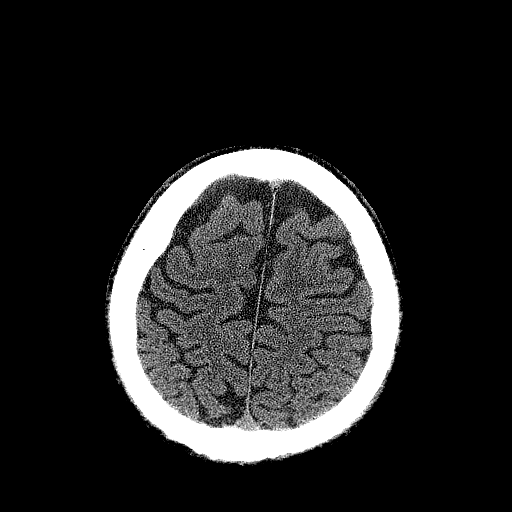
[im 50/64  brain]
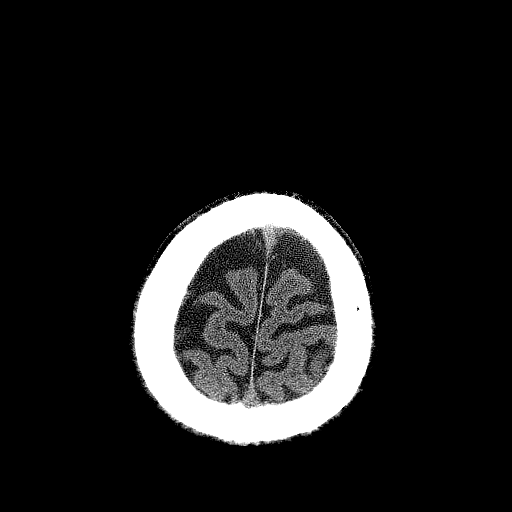
[im 50/64  bone]
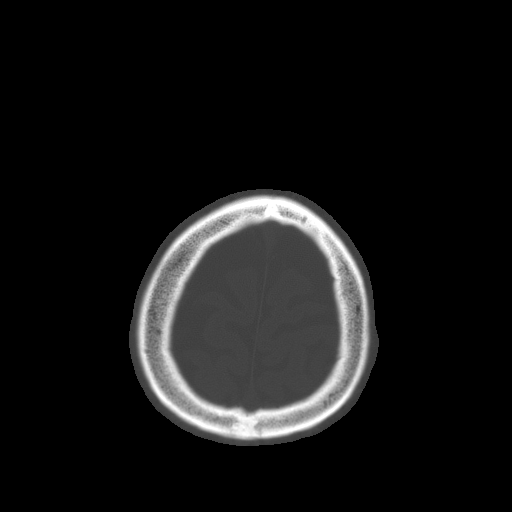
[im 54/64  brain]
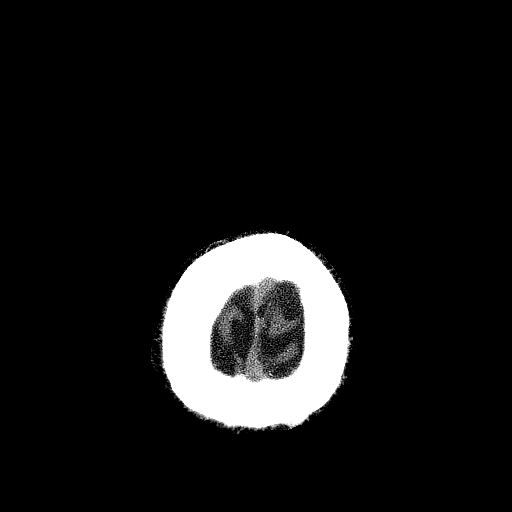
[im 57/64  brain]
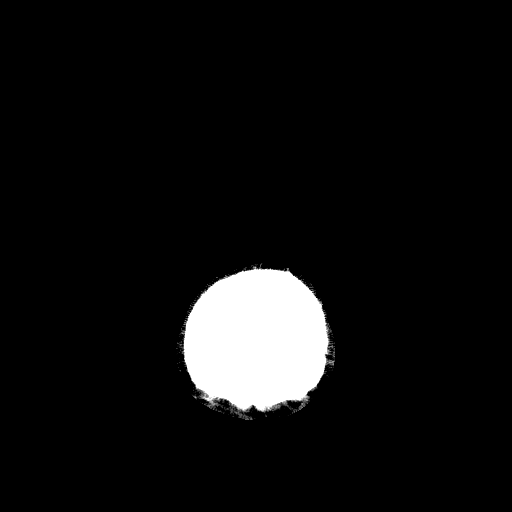
[im 60/64  brain]
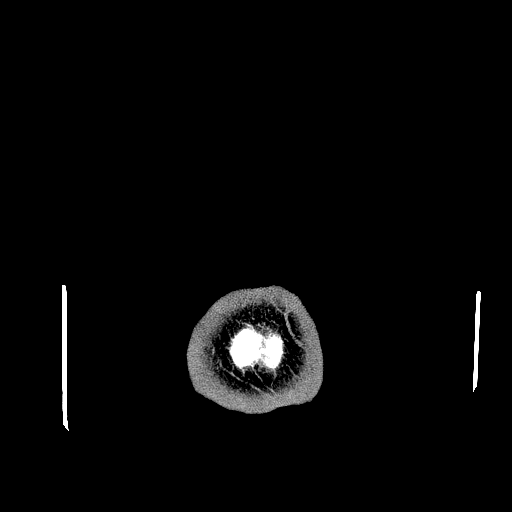

[16 of 30 positions shown; findings below may reference images not displayed]

FINDINGS: Moderate generalized cerebral atrophy is identified without evidence of acute intracranial abnormality.  There is no evidence of mass or mass effect, hydrocephalus, extraaxial fluid collection, midline shift, hemorrhage, or infarct.  
 Visualized bony calvarium is unremarkable.
IMPRESSION: 1.  No evidence of acute intracranial abnormality. 
 2.  Atrophy.

## 2008-01-21 ENCOUNTER — Other Ambulatory Visit: Payer: Self-pay | Admitting: Emergency Medicine

## 2008-01-21 ENCOUNTER — Inpatient Hospital Stay (HOSPITAL_COMMUNITY): Admission: RE | Admit: 2008-01-21 | Discharge: 2008-02-01 | Payer: Self-pay | Admitting: *Deleted

## 2008-02-28 ENCOUNTER — Emergency Department (HOSPITAL_COMMUNITY): Admission: EM | Admit: 2008-02-28 | Discharge: 2008-02-28 | Payer: Self-pay | Admitting: Emergency Medicine

## 2008-03-01 ENCOUNTER — Emergency Department (HOSPITAL_COMMUNITY): Admission: EM | Admit: 2008-03-01 | Discharge: 2008-03-01 | Payer: Self-pay | Admitting: Emergency Medicine

## 2008-03-28 ENCOUNTER — Emergency Department (HOSPITAL_COMMUNITY): Admission: EM | Admit: 2008-03-28 | Discharge: 2008-03-29 | Payer: Self-pay | Admitting: *Deleted

## 2008-04-14 ENCOUNTER — Emergency Department (HOSPITAL_COMMUNITY): Admission: EM | Admit: 2008-04-14 | Discharge: 2008-04-14 | Payer: Self-pay | Admitting: Emergency Medicine

## 2008-05-05 ENCOUNTER — Emergency Department (HOSPITAL_COMMUNITY): Admission: EM | Admit: 2008-05-05 | Discharge: 2008-05-05 | Payer: Self-pay | Admitting: Emergency Medicine

## 2008-05-07 ENCOUNTER — Emergency Department (HOSPITAL_COMMUNITY): Admission: EM | Admit: 2008-05-07 | Discharge: 2008-05-07 | Payer: Self-pay | Admitting: Emergency Medicine

## 2008-05-14 ENCOUNTER — Other Ambulatory Visit: Payer: Self-pay

## 2008-05-15 ENCOUNTER — Inpatient Hospital Stay (HOSPITAL_COMMUNITY): Admission: EM | Admit: 2008-05-15 | Discharge: 2008-05-17 | Payer: Self-pay | Admitting: *Deleted

## 2008-05-15 ENCOUNTER — Ambulatory Visit: Payer: Self-pay | Admitting: *Deleted

## 2008-05-23 ENCOUNTER — Emergency Department (HOSPITAL_COMMUNITY): Admission: EM | Admit: 2008-05-23 | Discharge: 2008-05-23 | Payer: Self-pay | Admitting: Emergency Medicine

## 2008-05-26 ENCOUNTER — Emergency Department (HOSPITAL_COMMUNITY): Admission: EM | Admit: 2008-05-26 | Discharge: 2008-05-26 | Payer: Self-pay | Admitting: Family Medicine

## 2008-06-15 ENCOUNTER — Emergency Department (HOSPITAL_COMMUNITY): Admission: EM | Admit: 2008-06-15 | Discharge: 2008-06-15 | Payer: Self-pay | Admitting: Emergency Medicine

## 2008-06-16 ENCOUNTER — Emergency Department (HOSPITAL_COMMUNITY): Admission: EM | Admit: 2008-06-16 | Discharge: 2008-06-16 | Payer: Self-pay | Admitting: Emergency Medicine

## 2008-06-25 ENCOUNTER — Inpatient Hospital Stay (HOSPITAL_COMMUNITY): Admission: EM | Admit: 2008-06-25 | Discharge: 2008-06-26 | Payer: Self-pay | Admitting: Emergency Medicine

## 2008-06-26 ENCOUNTER — Ambulatory Visit: Payer: Self-pay | Admitting: Internal Medicine

## 2008-07-08 ENCOUNTER — Emergency Department (HOSPITAL_COMMUNITY): Admission: EM | Admit: 2008-07-08 | Discharge: 2008-07-09 | Payer: Self-pay | Admitting: Emergency Medicine

## 2008-07-22 ENCOUNTER — Emergency Department (HOSPITAL_COMMUNITY): Admission: EM | Admit: 2008-07-22 | Discharge: 2008-07-22 | Payer: Self-pay | Admitting: Emergency Medicine

## 2008-07-28 ENCOUNTER — Other Ambulatory Visit: Payer: Self-pay | Admitting: Emergency Medicine

## 2008-07-28 ENCOUNTER — Ambulatory Visit: Payer: Self-pay | Admitting: *Deleted

## 2008-07-28 ENCOUNTER — Inpatient Hospital Stay (HOSPITAL_COMMUNITY): Admission: AD | Admit: 2008-07-28 | Discharge: 2008-08-01 | Payer: Self-pay | Admitting: *Deleted

## 2008-08-08 ENCOUNTER — Emergency Department (HOSPITAL_COMMUNITY): Admission: EM | Admit: 2008-08-08 | Discharge: 2008-08-08 | Payer: Self-pay | Admitting: Emergency Medicine

## 2008-08-12 ENCOUNTER — Emergency Department (HOSPITAL_COMMUNITY): Admission: EM | Admit: 2008-08-12 | Discharge: 2008-08-12 | Payer: Self-pay | Admitting: Emergency Medicine

## 2008-08-21 ENCOUNTER — Emergency Department (HOSPITAL_COMMUNITY): Admission: EM | Admit: 2008-08-21 | Discharge: 2008-08-21 | Payer: Self-pay | Admitting: Emergency Medicine

## 2008-09-01 ENCOUNTER — Emergency Department (HOSPITAL_COMMUNITY): Admission: EM | Admit: 2008-09-01 | Discharge: 2008-09-01 | Payer: Self-pay | Admitting: Emergency Medicine

## 2008-09-08 IMAGING — CR DG CHEST 2V
2 series · 2 of 2 positions shown · non-contrast
Comparison: 07/24/2007

CLINICAL DATA: Fever, nausea, vomiting, body aches

CHEST - 2 VIEW

[w chest pa]
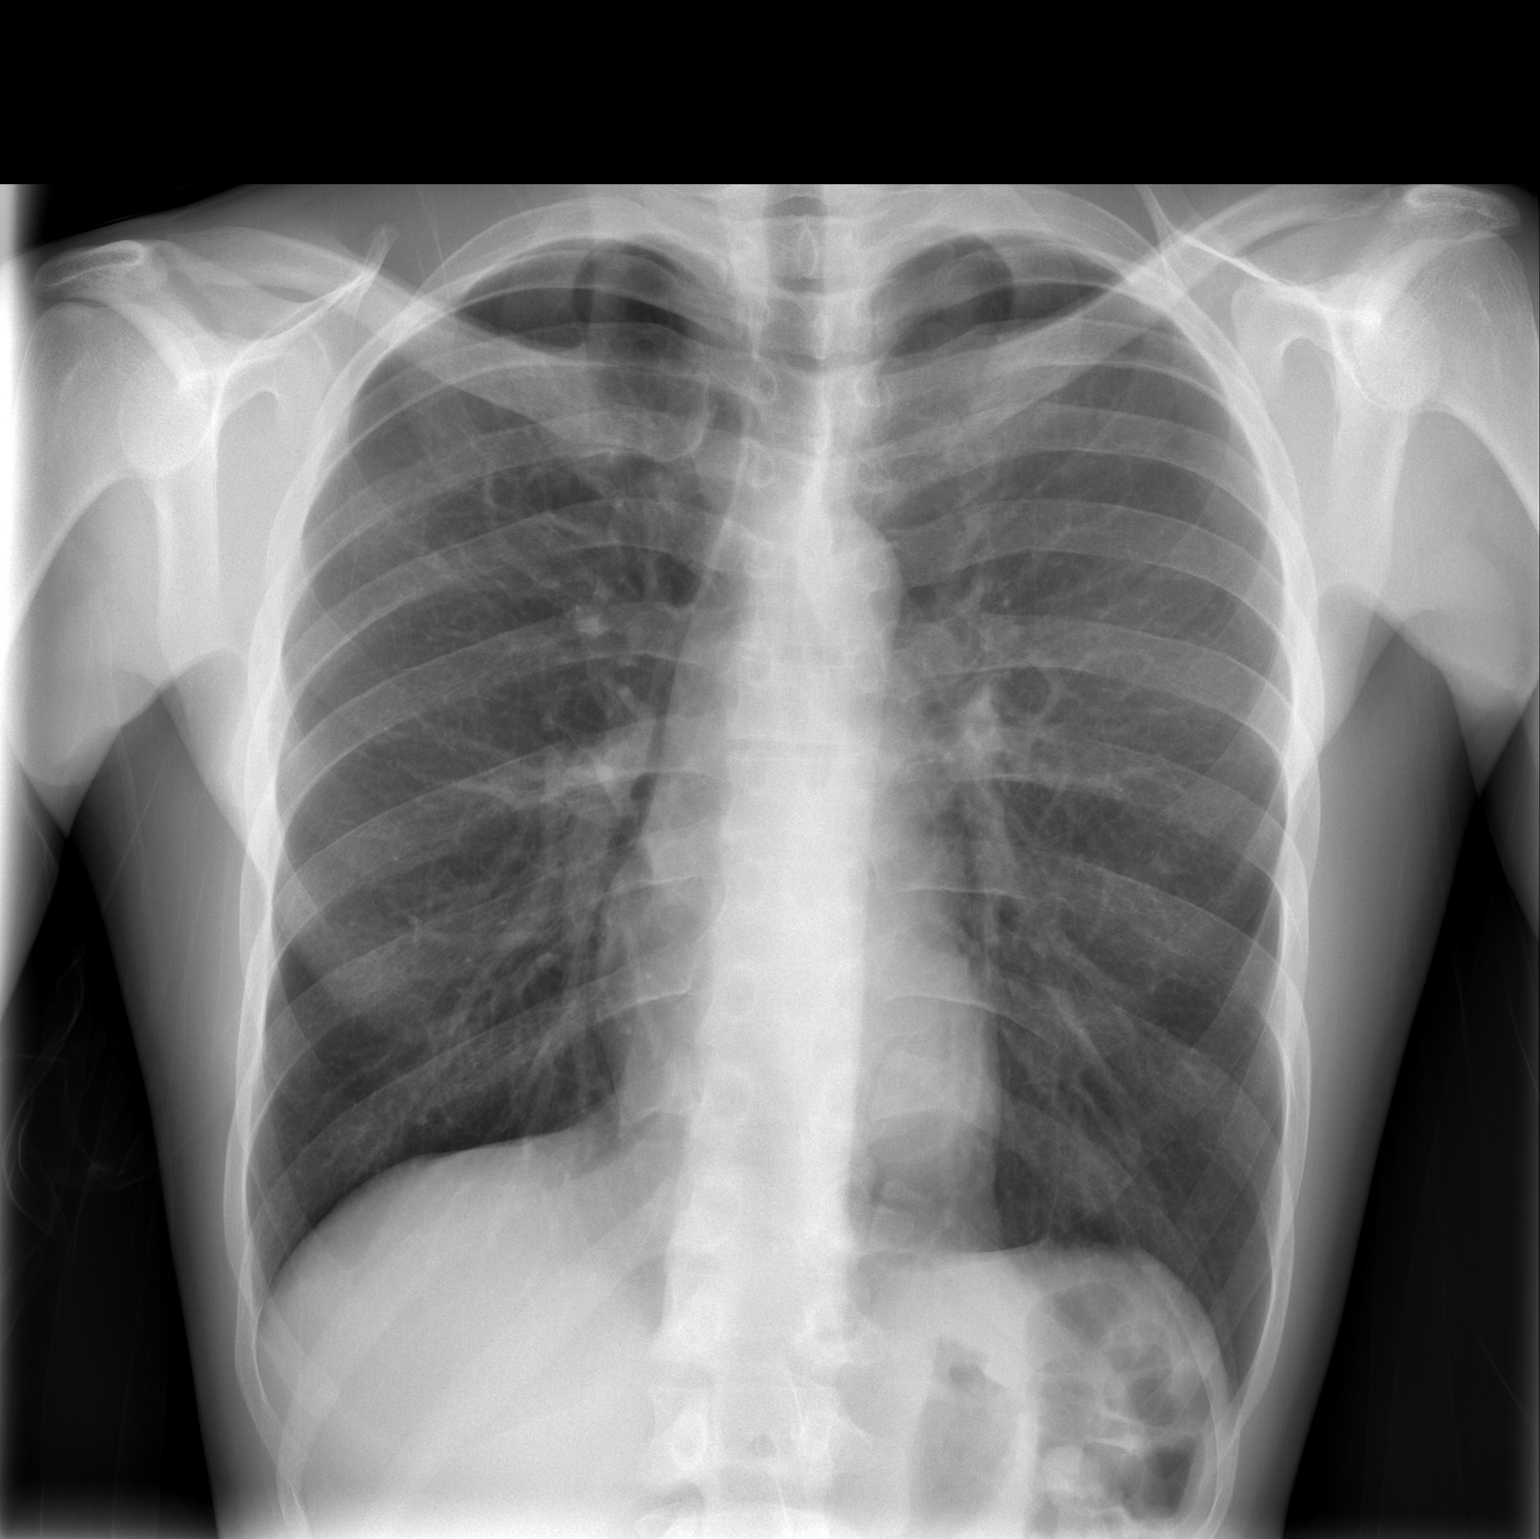

[w chest lat]
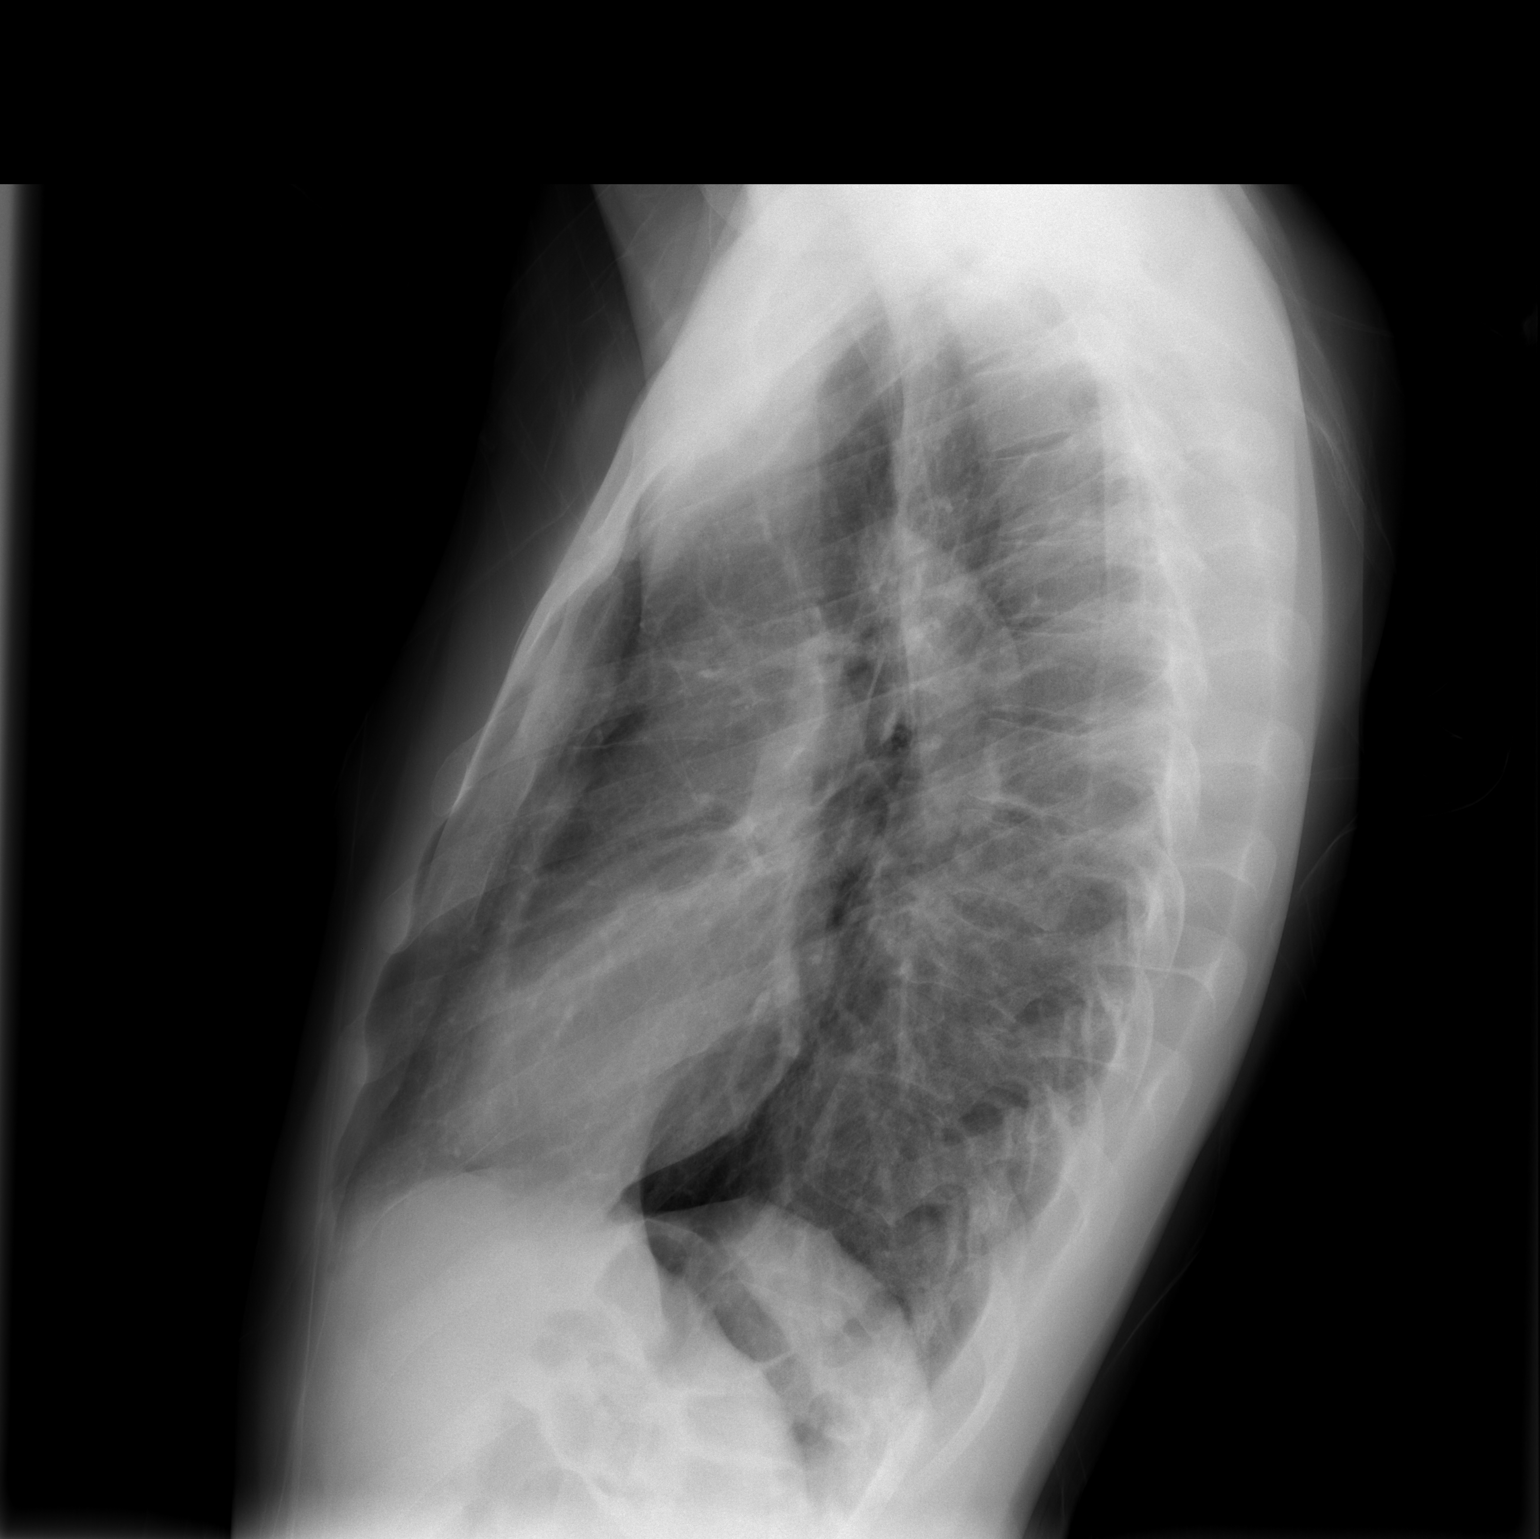

[2 of 2 positions shown; findings below may reference images not displayed]

FINDINGS: Normal heart size, mediastinal contours, and pulmonary vascularity.
Mild pulmonary hyperexpansion.
No pulmonary infiltrate, pleural effusion, or pneumothorax.
Clothing artifacts right upper chest.
Bones unremarkable.
IMPRESSION: Mildly hyperexpanded lungs without acute infiltrate.

## 2008-09-19 ENCOUNTER — Emergency Department (HOSPITAL_COMMUNITY): Admission: EM | Admit: 2008-09-19 | Discharge: 2008-09-19 | Payer: Self-pay | Admitting: Emergency Medicine

## 2008-09-20 ENCOUNTER — Other Ambulatory Visit: Payer: Self-pay | Admitting: Emergency Medicine

## 2008-09-20 ENCOUNTER — Ambulatory Visit: Payer: Self-pay | Admitting: Psychiatry

## 2008-09-21 ENCOUNTER — Inpatient Hospital Stay (HOSPITAL_COMMUNITY): Admission: RE | Admit: 2008-09-21 | Discharge: 2008-09-22 | Payer: Self-pay | Admitting: Psychiatry

## 2008-10-10 ENCOUNTER — Emergency Department (HOSPITAL_COMMUNITY): Admission: EM | Admit: 2008-10-10 | Discharge: 2008-10-10 | Payer: Self-pay | Admitting: Emergency Medicine

## 2008-10-21 ENCOUNTER — Emergency Department (HOSPITAL_COMMUNITY): Admission: EM | Admit: 2008-10-21 | Discharge: 2008-10-21 | Payer: Self-pay | Admitting: Emergency Medicine

## 2008-10-28 ENCOUNTER — Emergency Department (HOSPITAL_COMMUNITY): Admission: EM | Admit: 2008-10-28 | Discharge: 2008-10-28 | Payer: Self-pay | Admitting: Emergency Medicine

## 2008-11-02 ENCOUNTER — Ambulatory Visit (HOSPITAL_COMMUNITY): Payer: Self-pay | Admitting: Psychiatry

## 2008-11-04 ENCOUNTER — Emergency Department (HOSPITAL_COMMUNITY): Admission: EM | Admit: 2008-11-04 | Discharge: 2008-11-04 | Payer: Self-pay | Admitting: Emergency Medicine

## 2008-11-10 ENCOUNTER — Emergency Department (HOSPITAL_COMMUNITY): Admission: EM | Admit: 2008-11-10 | Discharge: 2008-11-10 | Payer: Self-pay | Admitting: Emergency Medicine

## 2008-11-27 ENCOUNTER — Emergency Department (HOSPITAL_COMMUNITY): Admission: EM | Admit: 2008-11-27 | Discharge: 2008-11-27 | Payer: Self-pay | Admitting: Emergency Medicine

## 2008-12-09 ENCOUNTER — Emergency Department (HOSPITAL_COMMUNITY): Admission: EM | Admit: 2008-12-09 | Discharge: 2008-12-09 | Payer: Self-pay | Admitting: Emergency Medicine

## 2008-12-14 ENCOUNTER — Encounter (HOSPITAL_BASED_OUTPATIENT_CLINIC_OR_DEPARTMENT_OTHER): Admission: RE | Admit: 2008-12-14 | Discharge: 2009-03-08 | Payer: Self-pay | Admitting: Internal Medicine

## 2008-12-31 ENCOUNTER — Emergency Department (HOSPITAL_COMMUNITY): Admission: EM | Admit: 2008-12-31 | Discharge: 2008-12-31 | Payer: Self-pay | Admitting: Emergency Medicine

## 2009-01-03 ENCOUNTER — Emergency Department (HOSPITAL_COMMUNITY): Admission: EM | Admit: 2009-01-03 | Discharge: 2009-01-03 | Payer: Self-pay | Admitting: Emergency Medicine

## 2009-02-06 ENCOUNTER — Emergency Department (HOSPITAL_COMMUNITY): Admission: EM | Admit: 2009-02-06 | Discharge: 2009-02-06 | Payer: Self-pay | Admitting: Emergency Medicine

## 2009-02-14 ENCOUNTER — Emergency Department (HOSPITAL_COMMUNITY): Admission: EM | Admit: 2009-02-14 | Discharge: 2009-02-14 | Payer: Self-pay | Admitting: Emergency Medicine

## 2009-02-16 ENCOUNTER — Emergency Department (HOSPITAL_COMMUNITY): Admission: EM | Admit: 2009-02-16 | Discharge: 2009-02-16 | Payer: Self-pay | Admitting: Emergency Medicine

## 2009-03-08 ENCOUNTER — Emergency Department (HOSPITAL_COMMUNITY): Admission: EM | Admit: 2009-03-08 | Discharge: 2009-03-08 | Payer: Self-pay | Admitting: Emergency Medicine

## 2009-03-14 ENCOUNTER — Encounter (HOSPITAL_BASED_OUTPATIENT_CLINIC_OR_DEPARTMENT_OTHER): Admission: RE | Admit: 2009-03-14 | Discharge: 2009-06-12 | Payer: Self-pay | Admitting: Internal Medicine

## 2009-03-20 ENCOUNTER — Emergency Department (HOSPITAL_COMMUNITY): Admission: EM | Admit: 2009-03-20 | Discharge: 2009-03-20 | Payer: Self-pay | Admitting: Emergency Medicine

## 2009-04-03 ENCOUNTER — Emergency Department (HOSPITAL_COMMUNITY): Admission: EM | Admit: 2009-04-03 | Discharge: 2009-04-03 | Payer: Self-pay | Admitting: Emergency Medicine

## 2009-04-04 ENCOUNTER — Emergency Department (HOSPITAL_COMMUNITY): Admission: EM | Admit: 2009-04-04 | Discharge: 2009-04-04 | Payer: Self-pay | Admitting: Emergency Medicine

## 2009-04-15 ENCOUNTER — Emergency Department (HOSPITAL_COMMUNITY): Admission: EM | Admit: 2009-04-15 | Discharge: 2009-04-15 | Payer: Self-pay | Admitting: Emergency Medicine

## 2009-04-17 ENCOUNTER — Inpatient Hospital Stay (HOSPITAL_COMMUNITY): Admission: RE | Admit: 2009-04-17 | Discharge: 2009-04-19 | Payer: Self-pay | Admitting: Psychiatry

## 2009-04-17 ENCOUNTER — Other Ambulatory Visit: Payer: Self-pay

## 2009-04-17 ENCOUNTER — Ambulatory Visit: Payer: Self-pay | Admitting: Psychiatry

## 2009-05-03 ENCOUNTER — Ambulatory Visit (HOSPITAL_COMMUNITY): Admission: RE | Admit: 2009-05-03 | Discharge: 2009-05-03 | Payer: Self-pay | Admitting: Internal Medicine

## 2009-05-30 ENCOUNTER — Emergency Department (HOSPITAL_COMMUNITY): Admission: EM | Admit: 2009-05-30 | Discharge: 2009-05-30 | Payer: Self-pay | Admitting: Emergency Medicine

## 2009-06-14 ENCOUNTER — Ambulatory Visit (HOSPITAL_COMMUNITY): Admission: RE | Admit: 2009-06-14 | Discharge: 2009-06-14 | Payer: Self-pay | Admitting: Internal Medicine

## 2009-06-19 ENCOUNTER — Encounter (HOSPITAL_BASED_OUTPATIENT_CLINIC_OR_DEPARTMENT_OTHER): Admission: RE | Admit: 2009-06-19 | Discharge: 2009-08-09 | Payer: Self-pay | Admitting: Internal Medicine

## 2009-06-19 ENCOUNTER — Encounter (HOSPITAL_COMMUNITY): Admission: RE | Admit: 2009-06-19 | Discharge: 2009-09-17 | Payer: Self-pay | Admitting: Internal Medicine

## 2009-09-03 ENCOUNTER — Encounter (INDEPENDENT_AMBULATORY_CARE_PROVIDER_SITE_OTHER): Payer: Self-pay | Admitting: Emergency Medicine

## 2009-09-03 ENCOUNTER — Emergency Department (HOSPITAL_COMMUNITY): Admission: EM | Admit: 2009-09-03 | Discharge: 2009-09-03 | Payer: Self-pay | Admitting: Emergency Medicine

## 2009-09-03 ENCOUNTER — Ambulatory Visit: Payer: Self-pay | Admitting: Vascular Surgery

## 2009-09-06 ENCOUNTER — Emergency Department (HOSPITAL_COMMUNITY): Admission: EM | Admit: 2009-09-06 | Discharge: 2009-09-06 | Payer: Self-pay | Admitting: Emergency Medicine

## 2009-10-11 ENCOUNTER — Emergency Department (HOSPITAL_COMMUNITY): Admission: EM | Admit: 2009-10-11 | Discharge: 2009-10-11 | Payer: Self-pay | Admitting: Emergency Medicine

## 2009-10-16 ENCOUNTER — Emergency Department (HOSPITAL_COMMUNITY): Admission: EM | Admit: 2009-10-16 | Discharge: 2009-10-16 | Payer: Self-pay | Admitting: Emergency Medicine

## 2009-11-14 ENCOUNTER — Emergency Department (HOSPITAL_COMMUNITY): Admission: EM | Admit: 2009-11-14 | Discharge: 2009-11-14 | Payer: Self-pay | Admitting: Family Medicine

## 2010-01-07 ENCOUNTER — Emergency Department (HOSPITAL_COMMUNITY): Admission: EM | Admit: 2010-01-07 | Discharge: 2010-01-07 | Payer: Self-pay | Admitting: Emergency Medicine

## 2010-01-20 ENCOUNTER — Emergency Department (HOSPITAL_COMMUNITY): Admission: EM | Admit: 2010-01-20 | Discharge: 2010-01-20 | Payer: Self-pay | Admitting: Emergency Medicine

## 2010-04-27 ENCOUNTER — Inpatient Hospital Stay: Admission: AD | Admit: 2010-04-27 | Payer: Medicaid Other | Source: Ambulatory Visit | Admitting: Internal Medicine

## 2010-05-03 ENCOUNTER — Encounter (HOSPITAL_BASED_OUTPATIENT_CLINIC_OR_DEPARTMENT_OTHER): Payer: Medicaid Other | Attending: Internal Medicine

## 2010-05-03 DIAGNOSIS — M76899 Other specified enthesopathies of unspecified lower limb, excluding foot: Secondary | ICD-10-CM | POA: Insufficient documentation

## 2010-05-03 DIAGNOSIS — S81009A Unspecified open wound, unspecified knee, initial encounter: Secondary | ICD-10-CM | POA: Insufficient documentation

## 2010-05-03 DIAGNOSIS — X58XXXA Exposure to other specified factors, initial encounter: Secondary | ICD-10-CM | POA: Insufficient documentation

## 2010-05-24 ENCOUNTER — Encounter (HOSPITAL_BASED_OUTPATIENT_CLINIC_OR_DEPARTMENT_OTHER): Payer: Medicaid Other | Attending: Internal Medicine

## 2010-05-24 DIAGNOSIS — M76899 Other specified enthesopathies of unspecified lower limb, excluding foot: Secondary | ICD-10-CM | POA: Insufficient documentation

## 2010-05-24 DIAGNOSIS — S81009A Unspecified open wound, unspecified knee, initial encounter: Secondary | ICD-10-CM | POA: Insufficient documentation

## 2010-05-24 DIAGNOSIS — X58XXXA Exposure to other specified factors, initial encounter: Secondary | ICD-10-CM | POA: Insufficient documentation

## 2010-05-29 LAB — ETHANOL: Alcohol, Ethyl (B): <5 mg/dL (ref 0–10)

## 2010-05-29 LAB — DIFFERENTIAL
Basophils Absolute: 0 10*3/uL (ref 0.0–0.1)
Basophils Relative: 1 % (ref 0–1)
Eosinophils Relative: 2 % (ref 0–5)
Lymphocytes Relative: 16 % (ref 12–46)
Monocytes Absolute: 1.1 10*3/uL — ABNORMAL HIGH (ref 0.1–1.0)

## 2010-05-29 LAB — CBC
HCT: 47.2 % (ref 39.0–52.0)
MCHC: 34.2 g/dL (ref 30.0–36.0)
MCV: 86.7 fL (ref 78.0–100.0)
Platelets: 360 10*3/uL (ref 150–400)
RDW: 14.1 % (ref 11.5–15.5)
WBC: 7.5 10*3/uL (ref 4.0–10.5)

## 2010-05-29 LAB — BASIC METABOLIC PANEL
BUN: 17 mg/dL (ref 6–23)
Creatinine, Ser: 0.89 mg/dL (ref 0.4–1.5)
GFR calc non Af Amer: >60 mL/min (ref >60)
Glucose, Bld: 87 mg/dL (ref 70–99)
Potassium: 4.6 mEq/L (ref 3.5–5.1)

## 2010-05-29 LAB — RAPID URINE DRUG SCREEN, HOSP PERFORMED
Barbiturates: NOT DETECTED
Benzodiazepines: NOT DETECTED

## 2010-05-29 LAB — TRICYCLICS SCREEN, URINE: TCA Scrn: NOT DETECTED

## 2010-06-03 LAB — RAPID URINE DRUG SCREEN, HOSP PERFORMED
Amphetamines: NOT DETECTED
Barbiturates: NOT DETECTED
Opiates: NOT DETECTED
Tetrahydrocannabinol: POSITIVE — AB

## 2010-06-03 LAB — DIFFERENTIAL
Basophils Absolute: 0 10*3/uL (ref 0.0–0.1)
Basophils Relative: 0 % (ref 0–1)
Eosinophils Absolute: 0.1 10*3/uL (ref 0.0–0.7)
Eosinophils Relative: 2 % (ref 0–5)
Lymphocytes Relative: 25 % (ref 12–46)
Lymphs Abs: 2.1 10*3/uL (ref 0.7–4.0)
Monocytes Absolute: 0.5 10*3/uL (ref 0.1–1.0)
Monocytes Relative: 7 % (ref 3–12)
Neutro Abs: 5.6 10*3/uL (ref 1.7–7.7)
Neutrophils Relative %: 66 % (ref 43–77)

## 2010-06-03 LAB — CBC
HCT: 47.2 % (ref 39.0–52.0)
Hemoglobin: 16 g/dL (ref 13.0–17.0)
MCHC: 33.8 g/dL (ref 30.0–36.0)
MCV: 86.6 fL (ref 78.0–100.0)
Platelets: 394 10*3/uL (ref 150–400)
RBC: 5.45 MIL/uL (ref 4.22–5.81)
RBC: 5.06 MIL/uL (ref 4.22–5.81)
RDW: 13.5 % (ref 11.5–15.5)
WBC: 11.5 10*3/uL — ABNORMAL HIGH (ref 4.0–10.5)
WBC: 8.4 10*3/uL (ref 4.0–10.5)

## 2010-06-03 LAB — COMPREHENSIVE METABOLIC PANEL WITH GFR
ALT: 14 U/L (ref 0–53)
AST: 20 U/L (ref 0–37)
Albumin: 4 g/dL (ref 3.5–5.2)
Alkaline Phosphatase: 79 U/L (ref 39–117)
BUN: 13 mg/dL (ref 6–23)
CO2: 26 meq/L (ref 19–32)
Calcium: 9.3 mg/dL (ref 8.4–10.5)
Chloride: 107 meq/L (ref 96–112)
Creatinine, Ser: 0.68 mg/dL (ref 0.4–1.5)
GFR calc non Af Amer: >60 mL/min
Glucose, Bld: 103 mg/dL — ABNORMAL HIGH (ref 70–99)
Potassium: 3.8 meq/L (ref 3.5–5.1)
Sodium: 142 meq/L (ref 135–145)
Total Bilirubin: 1.2 mg/dL (ref 0.3–1.2)
Total Protein: 7.5 g/dL (ref 6.0–8.3)

## 2010-06-03 LAB — POCT CARDIAC MARKERS
CKMB, poc: 1.5 ng/mL (ref 1.0–8.0)
Myoglobin, poc: 66.6 ng/mL (ref 12–200)
Troponin i, poc: <0.05 ng/mL (ref 0.00–0.09)

## 2010-06-03 LAB — ETHANOL

## 2010-06-04 LAB — POCT I-STAT, CHEM 8
BUN: 15 mg/dL (ref 6–23)
Chloride: 101 mEq/L (ref 96–112)
Creatinine, Ser: 0.9 mg/dL (ref 0.4–1.5)
Sodium: 137 mEq/L (ref 135–145)
TCO2: 29 mmol/L (ref 0–100)

## 2010-06-04 LAB — DIFFERENTIAL
Eosinophils Relative: 2 % (ref 0–5)
Eosinophils Relative: 3 % (ref 0–5)
Lymphocytes Relative: 29 % (ref 12–46)
Lymphocytes Relative: 33 % (ref 12–46)
Lymphs Abs: 2.5 10*3/uL (ref 0.7–4.0)
Monocytes Absolute: 0.7 10*3/uL (ref 0.1–1.0)
Monocytes Absolute: 0.8 10*3/uL (ref 0.1–1.0)
Monocytes Relative: 9 % (ref 3–12)
Neutro Abs: 4 10*3/uL (ref 1.7–7.7)
Neutro Abs: 4.5 10*3/uL (ref 1.7–7.7)

## 2010-06-04 LAB — RAPID URINE DRUG SCREEN, HOSP PERFORMED
Amphetamines: NOT DETECTED
Opiates: NOT DETECTED
Tetrahydrocannabinol: POSITIVE — AB

## 2010-06-04 LAB — URINE CULTURE: Colony Count: >=100000

## 2010-06-04 LAB — CBC
HCT: 44 % (ref 39.0–52.0)
HCT: 44.7 % (ref 39.0–52.0)
Hemoglobin: 15 g/dL (ref 13.0–17.0)
Platelets: 331 10*3/uL (ref 150–400)
Platelets: 349 10*3/uL (ref 150–400)
RDW: 13.5 % (ref 11.5–15.5)
WBC: 7.5 10*3/uL (ref 4.0–10.5)
WBC: 7.5 10*3/uL (ref 4.0–10.5)

## 2010-06-04 LAB — COMPREHENSIVE METABOLIC PANEL
AST: 17 U/L (ref 0–37)
Albumin: 4 g/dL (ref 3.5–5.2)
Alkaline Phosphatase: 74 U/L (ref 39–117)
BUN: 17 mg/dL (ref 6–23)
Chloride: 104 mEq/L (ref 96–112)
Creatinine, Ser: 0.74 mg/dL (ref 0.4–1.5)
GFR calc Af Amer: >60 mL/min (ref >60)
Potassium: 4.3 mEq/L (ref 3.5–5.1)
Total Protein: 7.4 g/dL (ref 6.0–8.3)

## 2010-06-04 LAB — URINALYSIS, ROUTINE W REFLEX MICROSCOPIC
Bilirubin Urine: NEGATIVE
Glucose, UA: NEGATIVE mg/dL
Hgb urine dipstick: NEGATIVE
Nitrite: NEGATIVE
Protein, ur: NEGATIVE mg/dL
Specific Gravity, Urine: 1.027 (ref 1.005–1.030)
Urobilinogen, UA: 1 mg/dL (ref 0.0–1.0)
pH: 6 (ref 5.0–8.0)
pH: 6.5 (ref 5.0–8.0)

## 2010-06-04 LAB — POCT CARDIAC MARKERS
CKMB, poc: 1.2 ng/mL (ref 1.0–8.0)
Myoglobin, poc: 65.7 ng/mL (ref 12–200)

## 2010-06-04 LAB — ETHANOL: Alcohol, Ethyl (B): <5 mg/dL (ref 0–10)

## 2010-06-06 LAB — CREATININE, SERUM
Creatinine, Ser: 0.74 mg/dL (ref 0.4–1.5)
GFR calc Af Amer: >60 mL/min (ref >60)
GFR calc non Af Amer: >60 mL/min (ref >60)

## 2010-06-10 LAB — POCT I-STAT, CHEM 8
Calcium, Ion: 1.09 mmol/L — ABNORMAL LOW (ref 1.12–1.32)
Chloride: 107 mEq/L (ref 96–112)
Glucose, Bld: 91 mg/dL (ref 70–99)
HCT: 45 % (ref 39.0–52.0)
Hemoglobin: 15.3 g/dL (ref 13.0–17.0)
TCO2: 24 mmol/L (ref 0–100)

## 2010-06-10 LAB — CBC
HCT: 44 % (ref 39.0–52.0)
Hemoglobin: 14.2 g/dL (ref 13.0–17.0)
MCHC: 32.3 g/dL (ref 30.0–36.0)
MCV: 87.8 fL (ref 78.0–100.0)
RBC: 5.02 MIL/uL (ref 4.22–5.81)

## 2010-06-10 LAB — DIFFERENTIAL
Basophils Relative: 0 % (ref 0–1)
Eosinophils Absolute: 0.3 10*3/uL (ref 0.0–0.7)
Eosinophils Relative: 3 % (ref 0–5)
Monocytes Absolute: 0.9 10*3/uL (ref 0.1–1.0)
Monocytes Relative: 9 % (ref 3–12)

## 2010-06-18 LAB — BASIC METABOLIC PANEL
BUN: 16 mg/dL (ref 6–23)
Chloride: 102 mEq/L (ref 96–112)
Creatinine, Ser: 0.75 mg/dL (ref 0.4–1.5)

## 2010-06-18 LAB — DIFFERENTIAL
Basophils Relative: 0 % (ref 0–1)
Eosinophils Absolute: 0.3 10*3/uL (ref 0.0–0.7)
Lymphs Abs: 3 10*3/uL (ref 0.7–4.0)
Neutrophils Relative %: 59 % (ref 43–77)

## 2010-06-18 LAB — CBC
MCHC: 33.9 g/dL (ref 30.0–36.0)
MCV: 86.4 fL (ref 78.0–100.0)
Platelets: 407 10*3/uL — ABNORMAL HIGH (ref 150–400)
WBC: 10.6 10*3/uL — ABNORMAL HIGH (ref 4.0–10.5)

## 2010-06-20 LAB — CBC
RBC: 5.11 MIL/uL (ref 4.22–5.81)
WBC: 8.6 10*3/uL (ref 4.0–10.5)

## 2010-06-20 LAB — POCT I-STAT, CHEM 8
Calcium, Ion: 1.23 mmol/L (ref 1.12–1.32)
Chloride: 99 mEq/L (ref 96–112)
Glucose, Bld: 90 mg/dL (ref 70–99)
HCT: 48 % (ref 39.0–52.0)
Hemoglobin: 16.3 g/dL (ref 13.0–17.0)
Potassium: 3.9 mEq/L (ref 3.5–5.1)

## 2010-06-20 LAB — DIFFERENTIAL
Lymphs Abs: 3 10*3/uL (ref 0.7–4.0)
Monocytes Relative: 11 % (ref 3–12)
Neutro Abs: 4.4 10*3/uL (ref 1.7–7.7)
Neutrophils Relative %: 51 % (ref 43–77)

## 2010-06-22 LAB — CBC
HCT: 44.7 % (ref 39.0–52.0)
Hemoglobin: 14.9 g/dL (ref 13.0–17.0)
MCHC: 33.5 g/dL (ref 30.0–36.0)
MCV: 86.8 fL (ref 78.0–100.0)
RBC: 5.14 MIL/uL (ref 4.22–5.81)
WBC: 9.1 10*3/uL (ref 4.0–10.5)

## 2010-06-22 LAB — URINALYSIS, ROUTINE W REFLEX MICROSCOPIC
Bilirubin Urine: NEGATIVE
Glucose, UA: NEGATIVE mg/dL
Hgb urine dipstick: NEGATIVE
Ketones, ur: NEGATIVE mg/dL
Protein, ur: NEGATIVE mg/dL

## 2010-06-22 LAB — BASIC METABOLIC PANEL
CO2: 28 mEq/L (ref 19–32)
Chloride: 107 mEq/L (ref 96–112)
GFR calc Af Amer: >60 mL/min (ref >60)
Potassium: 4.4 mEq/L (ref 3.5–5.1)

## 2010-06-22 LAB — RAPID URINE DRUG SCREEN, HOSP PERFORMED: Barbiturates: NOT DETECTED

## 2010-06-22 LAB — DIFFERENTIAL
Eosinophils Absolute: 0.1 10*3/uL (ref 0.0–0.7)
Eosinophils Relative: 2 % (ref 0–5)
Lymphs Abs: 2.2 10*3/uL (ref 0.7–4.0)
Monocytes Absolute: 0.8 10*3/uL (ref 0.1–1.0)
Monocytes Relative: 8 % (ref 3–12)

## 2010-06-22 LAB — ETHANOL: Alcohol, Ethyl (B): <5 mg/dL (ref 0–10)

## 2010-06-23 LAB — POCT I-STAT, CHEM 8
Creatinine, Ser: 0.9 mg/dL (ref 0.4–1.5)
Glucose, Bld: 76 mg/dL (ref 70–99)
HCT: 49 % (ref 39.0–52.0)
Hemoglobin: 16.7 g/dL (ref 13.0–17.0)
Hemoglobin: 15.3 g/dL (ref 13.0–17.0)
Potassium: 6.1 mEq/L — ABNORMAL HIGH (ref 3.5–5.1)
Sodium: 137 mEq/L (ref 135–145)
TCO2: 23 mmol/L (ref 0–100)

## 2010-06-23 LAB — DIFFERENTIAL
Basophils Relative: 0 % (ref 0–1)
Eosinophils Absolute: 0.2 10*3/uL (ref 0.0–0.7)
Lymphs Abs: 2.9 10*3/uL (ref 0.7–4.0)
Neutrophils Relative %: 54 % (ref 43–77)

## 2010-06-23 LAB — POCT CARDIAC MARKERS
CKMB, poc: 1.4 ng/mL (ref 1.0–8.0)
CKMB, poc: <1 ng/mL — ABNORMAL LOW (ref 1.0–8.0)
Myoglobin, poc: 106 ng/mL (ref 12–200)

## 2010-06-23 LAB — URINALYSIS, ROUTINE W REFLEX MICROSCOPIC
Glucose, UA: NEGATIVE mg/dL
Hgb urine dipstick: NEGATIVE
Ketones, ur: NEGATIVE mg/dL
pH: 6 (ref 5.0–8.0)

## 2010-06-23 LAB — CBC
MCHC: 33.4 g/dL (ref 30.0–36.0)
MCV: 87.3 fL (ref 78.0–100.0)
Platelets: 329 10*3/uL (ref 150–400)
WBC: 8.4 10*3/uL (ref 4.0–10.5)

## 2010-06-23 LAB — RAPID URINE DRUG SCREEN, HOSP PERFORMED
Benzodiazepines: NOT DETECTED
Cocaine: NOT DETECTED

## 2010-06-24 LAB — DIFFERENTIAL
Basophils Absolute: 0.1 10*3/uL (ref 0.0–0.1)
Basophils Absolute: 0.2 10*3/uL — ABNORMAL HIGH (ref 0.0–0.1)
Basophils Relative: 2 % — ABNORMAL HIGH (ref 0–1)
Eosinophils Absolute: 0.4 10*3/uL (ref 0.0–0.7)
Eosinophils Relative: 4 % (ref 0–5)
Lymphocytes Relative: 32 % (ref 12–46)
Lymphs Abs: 2.2 10*3/uL (ref 0.7–4.0)
Monocytes Absolute: 0.7 10*3/uL (ref 0.1–1.0)
Monocytes Relative: 9 % (ref 3–12)
Neutro Abs: 3.7 10*3/uL (ref 1.7–7.7)
Neutrophils Relative %: 53 % (ref 43–77)

## 2010-06-24 LAB — URINALYSIS, ROUTINE W REFLEX MICROSCOPIC
Glucose, UA: NEGATIVE mg/dL
Glucose, UA: NEGATIVE mg/dL
Hgb urine dipstick: NEGATIVE
Hgb urine dipstick: NEGATIVE
Specific Gravity, Urine: 1.018 (ref 1.005–1.030)
Specific Gravity, Urine: 1.029 (ref 1.005–1.030)
Urobilinogen, UA: 1 mg/dL (ref 0.0–1.0)
Urobilinogen, UA: 1 mg/dL (ref 0.0–1.0)

## 2010-06-24 LAB — RAPID URINE DRUG SCREEN, HOSP PERFORMED
Amphetamines: NOT DETECTED
Barbiturates: NOT DETECTED
Cocaine: NOT DETECTED
Opiates: NOT DETECTED
Tetrahydrocannabinol: POSITIVE — AB

## 2010-06-24 LAB — COMPREHENSIVE METABOLIC PANEL
AST: 24 U/L (ref 0–37)
BUN: 13 mg/dL (ref 6–23)
CO2: 30 mEq/L (ref 19–32)
Calcium: 9.1 mg/dL (ref 8.4–10.5)
Creatinine, Ser: 0.76 mg/dL (ref 0.4–1.5)
GFR calc Af Amer: >60 mL/min (ref >60)
GFR calc non Af Amer: >60 mL/min (ref >60)
Glucose, Bld: 91 mg/dL (ref 70–99)

## 2010-06-24 LAB — BASIC METABOLIC PANEL
BUN: 15 mg/dL (ref 6–23)
Chloride: 106 mEq/L (ref 96–112)
Creatinine, Ser: 0.7 mg/dL (ref 0.4–1.5)
GFR calc Af Amer: >60 mL/min (ref >60)
GFR calc non Af Amer: >60 mL/min (ref >60)
Potassium: 3.9 mEq/L (ref 3.5–5.1)

## 2010-06-24 LAB — CBC
HCT: 43.4 % (ref 39.0–52.0)
MCHC: 33.6 g/dL (ref 30.0–36.0)
MCV: 86.3 fL (ref 78.0–100.0)
MCV: 86.4 fL (ref 78.0–100.0)
Platelets: 315 10*3/uL (ref 150–400)
RBC: 5.03 MIL/uL (ref 4.22–5.81)
RBC: 4.68 MIL/uL (ref 4.22–5.81)
WBC: 8.6 10*3/uL (ref 4.0–10.5)

## 2010-06-24 LAB — ETHANOL: Alcohol, Ethyl (B): <5 mg/dL (ref 0–10)

## 2010-06-24 LAB — POCT I-STAT, CHEM 8
BUN: 22 mg/dL (ref 6–23)
Creatinine, Ser: 0.7 mg/dL (ref 0.4–1.5)
Potassium: 3.6 mEq/L (ref 3.5–5.1)
Sodium: 140 mEq/L (ref 135–145)

## 2010-06-25 LAB — CBC
HCT: 42 % (ref 39.0–52.0)
Hemoglobin: 14.2 g/dL (ref 13.0–17.0)
MCHC: 33.8 g/dL (ref 30.0–36.0)
Platelets: 336 10*3/uL (ref 150–400)
RDW: 14.1 % (ref 11.5–15.5)

## 2010-06-25 LAB — URINALYSIS, ROUTINE W REFLEX MICROSCOPIC
Glucose, UA: NEGATIVE mg/dL
Ketones, ur: NEGATIVE mg/dL
Nitrite: NEGATIVE
pH: 6 (ref 5.0–8.0)

## 2010-06-25 LAB — URINE CULTURE

## 2010-06-25 LAB — BASIC METABOLIC PANEL
BUN: 11 mg/dL (ref 6–23)
CO2: 32 mEq/L (ref 19–32)
Calcium: 9.4 mg/dL (ref 8.4–10.5)
Glucose, Bld: 87 mg/dL (ref 70–99)
Potassium: 4.2 mEq/L (ref 3.5–5.1)
Sodium: 143 mEq/L (ref 135–145)

## 2010-06-25 LAB — DIFFERENTIAL
Basophils Absolute: 0.1 10*3/uL (ref 0.0–0.1)
Lymphocytes Relative: 26 % (ref 12–46)
Neutro Abs: 3.9 10*3/uL (ref 1.7–7.7)

## 2010-06-26 LAB — DIFFERENTIAL
Basophils Absolute: 0 10*3/uL (ref 0.0–0.1)
Eosinophils Absolute: 0.4 10*3/uL (ref 0.0–0.7)
Eosinophils Relative: 4 % (ref 0–5)

## 2010-06-26 LAB — WOUND CULTURE: Gram Stain: NONE SEEN

## 2010-06-26 LAB — POCT I-STAT, CHEM 8
BUN: 13 mg/dL (ref 6–23)
Calcium, Ion: 1.23 mmol/L (ref 1.12–1.32)
Creatinine, Ser: 0.9 mg/dL (ref 0.4–1.5)
Glucose, Bld: 94 mg/dL (ref 70–99)
Hemoglobin: 13.9 g/dL (ref 13.0–17.0)
Sodium: 141 mEq/L (ref 135–145)
TCO2: 27 mmol/L (ref 0–100)
TCO2: 29 mmol/L (ref 0–100)

## 2010-06-26 LAB — CBC
HCT: 38.7 % — ABNORMAL LOW (ref 39.0–52.0)
MCHC: 33.9 g/dL (ref 30.0–36.0)
MCV: 87.1 fL (ref 78.0–100.0)
Platelets: 351 10*3/uL (ref 150–400)
RDW: 13.7 % (ref 11.5–15.5)

## 2010-06-26 LAB — ETHANOL: Alcohol, Ethyl (B): <5 mg/dL (ref 0–10)

## 2010-06-26 LAB — RAPID URINE DRUG SCREEN, HOSP PERFORMED
Barbiturates: NOT DETECTED
Benzodiazepines: NOT DETECTED
Cocaine: NOT DETECTED
Opiates: NOT DETECTED
Tetrahydrocannabinol: POSITIVE — AB

## 2010-06-27 LAB — COMPREHENSIVE METABOLIC PANEL
AST: 20 U/L (ref 0–37)
Albumin: 3.8 g/dL (ref 3.5–5.2)
Albumin: 3.6 g/dL (ref 3.5–5.2)
Alkaline Phosphatase: 80 U/L (ref 39–117)
BUN: 12 mg/dL (ref 6–23)
Calcium: 9.2 mg/dL (ref 8.4–10.5)
Calcium: 9.1 mg/dL (ref 8.4–10.5)
Creatinine, Ser: 0.93 mg/dL (ref 0.4–1.5)
GFR calc Af Amer: >60 mL/min (ref >60)
Glucose, Bld: 78 mg/dL (ref 70–99)
Potassium: 3.8 mEq/L (ref 3.5–5.1)
Sodium: 138 mEq/L (ref 135–145)
Total Protein: 6.8 g/dL (ref 6.0–8.3)
Total Protein: 6.8 g/dL (ref 6.0–8.3)

## 2010-06-27 LAB — DIFFERENTIAL
Basophils Relative: 1 % (ref 0–1)
Eosinophils Relative: 4 % (ref 0–5)
Lymphocytes Relative: 40 % (ref 12–46)
Lymphs Abs: 3.2 10*3/uL (ref 0.7–4.0)
Lymphs Abs: 2.8 10*3/uL (ref 0.7–4.0)
Monocytes Absolute: 0.7 10*3/uL (ref 0.1–1.0)
Monocytes Absolute: 1 10*3/uL (ref 0.1–1.0)
Monocytes Relative: 12 % (ref 3–12)
Neutro Abs: 3.8 10*3/uL (ref 1.7–7.7)
Neutro Abs: 4.3 10*3/uL (ref 1.7–7.7)
Neutrophils Relative %: 50 % (ref 43–77)

## 2010-06-27 LAB — TYPE AND SCREEN
ABO/RH(D): AB NEG
Antibody Screen: NEGATIVE

## 2010-06-27 LAB — POCT CARDIAC MARKERS
CKMB, poc: 1.9 ng/mL (ref 1.0–8.0)
Myoglobin, poc: 65.4 ng/mL (ref 12–200)
Troponin i, poc: <0.05 ng/mL (ref 0.00–0.09)
Troponin i, poc: <0.05 ng/mL (ref 0.00–0.09)

## 2010-06-27 LAB — CARDIAC PANEL(CRET KIN+CKTOT+MB+TROPI)
CK, MB: 3.4 ng/mL (ref 0.3–4.0)
Relative Index: 2 (ref 0.0–2.5)

## 2010-06-27 LAB — URINALYSIS, ROUTINE W REFLEX MICROSCOPIC
Glucose, UA: NEGATIVE mg/dL
Ketones, ur: NEGATIVE mg/dL
Nitrite: NEGATIVE
Protein, ur: NEGATIVE mg/dL
Urobilinogen, UA: 0.2 mg/dL (ref 0.0–1.0)

## 2010-06-27 LAB — CBC
HCT: 40.6 % (ref 39.0–52.0)
MCHC: 31.5 g/dL (ref 30.0–36.0)
MCHC: 33.7 g/dL (ref 30.0–36.0)
MCV: 86.3 fL (ref 78.0–100.0)
Platelets: 349 10*3/uL (ref 150–400)
Platelets: 429 10*3/uL — ABNORMAL HIGH (ref 150–400)
RDW: 14 % (ref 11.5–15.5)
RDW: 13.8 % (ref 11.5–15.5)
WBC: 8.1 10*3/uL (ref 4.0–10.5)

## 2010-06-27 LAB — HEMOCCULT GUIAC POC 1CARD (OFFICE): Fecal Occult Bld: NEGATIVE

## 2010-06-27 LAB — ABO/RH: ABO/RH(D): AB NEG

## 2010-06-27 LAB — APTT: aPTT: 34 seconds (ref 24–37)

## 2010-06-28 LAB — DIFFERENTIAL
Basophils Relative: 0 % (ref 0–1)
Eosinophils Absolute: 0.2 10*3/uL (ref 0.0–0.7)
Eosinophils Relative: 2 % (ref 0–5)
Lymphs Abs: 2.4 10*3/uL (ref 0.7–4.0)
Monocytes Absolute: 1 10*3/uL (ref 0.1–1.0)
Monocytes Relative: 10 % (ref 3–12)

## 2010-06-28 LAB — CBC
HCT: 38.7 % — ABNORMAL LOW (ref 39.0–52.0)
Hemoglobin: 13.2 g/dL (ref 13.0–17.0)
RDW: 13.2 % (ref 11.5–15.5)
WBC: 10.1 10*3/uL (ref 4.0–10.5)

## 2010-07-02 LAB — DIFFERENTIAL
Basophils Absolute: 0 10*3/uL (ref 0.0–0.1)
Basophils Relative: 0 % (ref 0–1)
Eosinophils Absolute: 0.4 10*3/uL (ref 0.0–0.7)
Eosinophils Relative: 4 % (ref 0–5)
Lymphocytes Relative: 30 % (ref 12–46)
Lymphs Abs: 2.9 10*3/uL (ref 0.7–4.0)
Monocytes Absolute: 1 10*3/uL (ref 0.1–1.0)
Monocytes Relative: 10 % (ref 3–12)
Neutro Abs: 5.4 10*3/uL (ref 1.7–7.7)
Neutrophils Relative %: 56 % (ref 43–77)

## 2010-07-02 LAB — URINALYSIS, ROUTINE W REFLEX MICROSCOPIC
Bilirubin Urine: NEGATIVE
Glucose, UA: NEGATIVE mg/dL
Nitrite: NEGATIVE
Specific Gravity, Urine: 1.028 (ref 1.005–1.030)
pH: 6.5 (ref 5.0–8.0)

## 2010-07-02 LAB — CBC
HCT: 41.1 % (ref 39.0–52.0)
Hemoglobin: 13.9 g/dL (ref 13.0–17.0)
MCHC: 33.7 g/dL (ref 30.0–36.0)
MCV: 85.7 fL (ref 78.0–100.0)
Platelets: 416 10*3/uL — ABNORMAL HIGH (ref 150–400)
RBC: 4.79 MIL/uL (ref 4.22–5.81)
RDW: 14.4 % (ref 11.5–15.5)
WBC: 9.7 10*3/uL (ref 4.0–10.5)

## 2010-07-02 LAB — COMPREHENSIVE METABOLIC PANEL
ALT: 28 U/L (ref 0–53)
AST: 24 U/L (ref 0–37)
Albumin: 3.5 g/dL (ref 3.5–5.2)
Alkaline Phosphatase: 82 U/L (ref 39–117)
BUN: 10 mg/dL (ref 6–23)
CO2: 27 mEq/L (ref 19–32)
Calcium: 9.4 mg/dL (ref 8.4–10.5)
Chloride: 102 mEq/L (ref 96–112)
Creatinine, Ser: 0.75 mg/dL (ref 0.4–1.5)
GFR calc Af Amer: >60 mL/min (ref >60)
GFR calc non Af Amer: >60 mL/min (ref >60)
Glucose, Bld: 93 mg/dL (ref 70–99)
Potassium: 3.9 mEq/L (ref 3.5–5.1)
Sodium: 138 mEq/L (ref 135–145)
Total Bilirubin: 0.6 mg/dL (ref 0.3–1.2)
Total Protein: 6.6 g/dL (ref 6.0–8.3)

## 2010-07-02 LAB — LIPASE, BLOOD: Lipase: 19 U/L (ref 11–59)

## 2010-07-03 LAB — DIFFERENTIAL
Basophils Absolute: 0 10*3/uL (ref 0.0–0.1)
Basophils Relative: 1 % (ref 0–1)
Eosinophils Absolute: 0.5 10*3/uL (ref 0.0–0.7)
Eosinophils Relative: 5 % (ref 0–5)
Lymphocytes Relative: 28 % (ref 12–46)
Monocytes Absolute: 0.9 10*3/uL (ref 0.1–1.0)
Monocytes Absolute: 1 10*3/uL (ref 0.1–1.0)
Monocytes Relative: 10 % (ref 3–12)
Neutro Abs: 4.6 10*3/uL (ref 1.7–7.7)
Neutro Abs: 5.6 10*3/uL (ref 1.7–7.7)

## 2010-07-03 LAB — CBC
Hemoglobin: 13.7 g/dL (ref 13.0–17.0)
Hemoglobin: 14.2 g/dL (ref 13.0–17.0)
MCHC: 34.7 g/dL (ref 30.0–36.0)
RBC: 4.8 MIL/uL (ref 4.22–5.81)
RDW: 13.2 % (ref 11.5–15.5)

## 2010-07-03 LAB — BASIC METABOLIC PANEL
CO2: 25 mEq/L (ref 19–32)
CO2: 30 mEq/L (ref 19–32)
Calcium: 9.5 mg/dL (ref 8.4–10.5)
Calcium: 9.8 mg/dL (ref 8.4–10.5)
Creatinine, Ser: 0.63 mg/dL (ref 0.4–1.5)
Glucose, Bld: 81 mg/dL (ref 70–99)
Glucose, Bld: 98 mg/dL (ref 70–99)
Sodium: 138 mEq/L (ref 135–145)
Sodium: 139 mEq/L (ref 135–145)

## 2010-07-03 LAB — CULTURE, BLOOD (ROUTINE X 2): Culture: NO GROWTH

## 2010-07-03 LAB — TRICYCLICS SCREEN, URINE: TCA Scrn: NOT DETECTED

## 2010-07-03 LAB — RAPID URINE DRUG SCREEN, HOSP PERFORMED: Cocaine: NOT DETECTED

## 2010-07-31 NOTE — Assessment & Plan Note (Signed)
[facility]   NAME:  PHILBERT, OCALLAGHAN                ACCOUNT NO.:  [id number]   MEDICAL RECORD NO.:  [id number]      DATE OF BIRTH:  [birth year]   PHYSICIAN:  Jake Shark A. Tanda Rockers, M.D. VISIT DATE:  05/21/2007                                   OFFICE VISIT   SUBJECTIVE:  [name] is a 30 year old man who we are evaluating for a  left knee chronic osteomyelitis of over one year's duration.  The  patient is currently on Cipro and Keflex for a positive culture of  staph.  He reports that he is having moderate pain but has not had a  temperature elevation or chill.  He continues to be ambulatory in spite  of the pain.  We have scheduled him to begin hyperbaric oxygen therapy  within the week.  He also has an outstanding consultation with the  Infectious Disease Department.   OBJECTIVE:  Blood pressure is 123/67, respirations 16, pulse rate 76,  temperature is 98.  He is accompanied by his girlfriend.  Inspection of  the left knee shows a residual of chronic edema that is certainly not  impressively increased since his last visit.  Examination of the wound  shows granulation at the base.  Sounding of the wound with a Q-tip  discloses no tracking or loculations.  The wound is tactically warm and  the Thermoscan discloses an 84.8 degree temperature on the left and an  80.5 degree temperature on the right.  The pedal pulse remains intact.   ASSESSMENT:  Chronic osteomyelitis historically and per current exam.  There is no evidence of acute deterioation or advancing fulminant  infection.   PLAN:  We will proceed with hyperbaric oxygen treatment.  We have  encouraged the patient to continue pursuit of the infectious disease  consultation.  He has also been advised to keep his appointments with  his primary care physician for management of and or referral for  management of  his chronic pain.  We have given the patient an  opportunity to ask questions.  He seems to understand  and indicates that  he will be compliant.      Harold A. Tanda Rockers, M.D.  Electronically Signed     HAN/MEDQ  D:  05/21/2007  T:  05/21/2007  Job:  [id number]   cc:   Jethro Bastos, M.D.

## 2010-07-31 NOTE — H&P (Signed)
[name], [name]                ACCOUNT NO.:  [id number]   MEDICAL RECORD NO.:  [id number]          PATIENT TYPE:  IPS   LOCATION:  0500                          FACILITY[facility]  [facility]   PHYSICIAN:  [name], M.D.      DATE OF BIRTH:  [birth year]   DATE OF ADMISSION:  09/20/2008  DATE OF DISCHARGE:                       PSYCHIATRIC ADMISSION ASSESSMENT   TIME:  8:15 a.m.   IDENTIFYING INFORMATION:  This is a 30 year old male.  This is a  voluntary admission.   HISTORY OF PRESENT ILLNESS:  This is one of several [facility] admissions for  this 30 year old who is well known to [place].  He presents with suicidal  thoughts, feeling much more depressed for the past couple of weeks.  He  attributes this to chronic financial stressors, feeling overwhelmed,  trying to manage his life in his apartment.  He has physical  disabilities involving some motor ataxia and mobility issues.  Manages  his own household, having difficulty making ends meet financially on his  limited disability income.  He has a 53-year-old daughter named [name]  who is doing well and that is a bright spot in his life.  He has not  been taking his antidepressant or his Zyprexa regularly.  Endorses that  he has been more tearful, feeling a lot more sad, realizing how  difficult it is to give his daughter what he feels like she is going to  need now and in the future.  Endorses that he smokes some marijuana, but  budgets himself to $20 a month.  He smokes about 4 cigarettes a day and  that is about all he can afford in terms of what he thinks of his  luxuries.  Thinking that he might want to overdose or do something else  to harm himself reviewing various plans.  He denies any other substance  abuse or homicidal thoughts.   PAST PSYCHIATRIC HISTORY:  This was one of several [facility] admissions.  He  has a history of depression with some fairly consistent cannabis abuse.  He has responded well to Zyprexa in the past.  Does not always take  it  regularly.  Also previously on Celexa.   SOCIAL HISTORY:  A 30 year old single African American male.  Graduated  with a liberal arts degree in 2002 from [employer].  He is  unemployed on disability for history of encephalitis with resulting  motor ataxia.  He has a 12-year-old daughter, [name], that goes back and  forth between his house and the child's mother's house also here in  [place].  Followed at [facility].  No legal  problems.   FAMILY HISTORY:  He denies family history of mental illness or substance  abuse.   PRIMARY CARE PRACTITIONER:  Dr. Lenn Sink at [facility]  at [place].   MEDICAL PROBLEMS:  1. 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  2. Chronic left knee wound.   PAST MEDICAL HISTORY:  1. Significant for patellar tendonitis and osteomyelitis of his left  lower leg.  2. 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   CURRENT MEDICATIONS:  1. Percocet, dose unclear which he takes for occasional knee pain, leg      pain.  2. Celexa 20 mg q.h.s.  3. Zyprexa 5 mg q.h.s.  4. Flexeril 10 mg t.i.d. p.r.n. which he takes for skeletal muscle      spasms.  He reports not taking any of these medications very      regularly.   DRUG ALLERGIES:  1. VICODIN.  2. IBUPROFEN.  3. CIPROFLOXACIN.   PHYSICAL EXAMINATION:  Physical exam was done in the emergency room.  He  is actually fairly healthy-appearing compared to his previous stays.  He  is 79 kg on admission or approximately 174 pounds compared to a weight  of 158 pounds in August 2009.  He ambulates with assistance of a cane or  sometimes while on our unit will occasionally use a wheelchair.   LABORATORY DATA:  CBC is normal.  His alcohol level was less than 5.  Urine drug screen positive for cannabis.  Chemistry within normal   limits.   MENTAL STATUS EXAM:  Fully alert male, cooperative, coherent, pleasant.  His motor abilities appear to be at baseline as does his speech which  include some dysarthria, but he gives a coherent history.  Speech is  well organized, relevant.  Mood is depressed.  Very proud of his  daughter.  Feels somewhat inadequate with his limited finances.  Wishes  that he had a job or a way to make some extra money.  Depressed about  the future and what it holds for him with his limitations and limited  income.  No specific suicidal plan today.  Cognition is intact.  Insight  good.  Impulse control and judgment normal.   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  Cannabis  abuse.  AXIS II:  No diagnosis.  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  AXIS IV:  Moderate chronic stressors with parenting  and financial limitations.  AXIS V:  Current 40, past year 20.   PLAN:  The plan is to voluntarily admit him.  We will consider possibly  restarting his Celexa or another antidepressant after we see how he  responds to group therapy on the unit.  He admits that he needs group  support.  Does not take his medications very regularly, so we will see  how he progresses in group.  He is in our dual diagnosis treatment  program.      Young Berry. Scott, N.P.      [name], M.D.  Electronically Signed    MAS/MEDQ  D:  09/21/2008  T:  09/21/2008  Job:  [id number]

## 2010-07-31 NOTE — H&P (Signed)
NAMECASYN, BECVAR                ACCOUNT NO.:  [id number]   MEDICAL RECORD NO.:  [id number]          PATIENT TYPE:  IPS   LOCATION:  0302                          FACILITY:  [facility]   PHYSICIAN:  Jasmine Pang, M.D. DATE OF BIRTH:  [birth year]   DATE OF ADMISSION:  07/28/2008  DATE OF DISCHARGE:                       PSYCHIATRIC ADMISSION ASSESSMENT   This is on a 30 year old male voluntarily admitted on Jul 28, 2008.   HISTORY OF PRESENT ILLNESS:  Patient is here because he is feeling  suicidal, had no specific plan.  Feels his medications are not working.  He has not been sleeping well.  His appetite has been satisfactory and  has had some recent weight gain.  He reports his stressors are primarily  financial, denies any other stressors right now.  Has been smoking  marijuana on a fairly consistent basis.   PAST PSYCHIATRIC HISTORY:  Patient has had multiple admissions.  He was  last here in March of 2010, was to follow up with the [facility].   SOCIAL HISTORY:  A 30 year old male.  He is single.  He has 1 daughter  and is on disability.   FAMILY HISTORY:  Denies any psychiatric history.   ALCOHOL AND DRUG HISTORY:  Has not used alcohol for 7 years.  Smokes  marijuana on a consistent basis.   PRIMARY CARE Bernhard Koskinen:  Dr. Dorothe Pea and he goes to [facility] for wound  care.   MEDICAL PROBLEMS:  Chronic wound, left knee, that is healing.   MEDICATIONS:  He has recently been on Cymbalta 30 mg daily which he  feels is not effective and Zyprexa 5 mg every 6 hours.   DRUG ALLERGIES:  1. VIOXX.  2. HYDROCODONE.  3. IBUPROFEN.   PHYSICAL EXAM:  This is a well-nourished male assessed at [facility].  Temperature of 97, 74 heart rate, 16 respirations.  Blood pressure is  107/68.  Six feet 3 inches tall, 80 kg.   LABORATORY DATA:  Shows an alcohol level less than 5.  Urine drug screen  is positive for THC.  BMET is within normal limits.  Hemoglobin of 13.9  and hematocrit  of 41.   MENTAL STATUS EXAM:  This is a fully alert male, cooperative, good eye  contact.  He is casually dressed.  Calm and cooperative.  His speech,  patient stutters.  Mood is neutral.  Patient is pleasant.  Sense of  humor.  Thought processes are coherent, goal directed.  Cognitive  function intact.  His memory appears intact.  Judgment and insight are  fair to good.   AXIS I:  1. Mood disorder.  2. THC abuse.  AXIS II:  Deferred.  AXIS III:  Chronic wound.  AXIS IV:  Economic problems.  AXIS V:  Current is 35 to 40.   PLAN:  Contract for safety.  Patient is considered a fall risk with  unstable gait.  We will discontinue his Cymbalta and initiate Celexa.  We will continue to assess comorbidities, identify support group, and  encourage his followup.   TENTATIVE LENGTH OF STAY:  Is 3 to  5 days.      Landry Corporal, N.P.      Jasmine Pang, M.D.  Electronically Signed    JO/MEDQ  D:  07/29/2008  T:  07/29/2008  Job:  [id number]

## 2010-07-31 NOTE — Discharge Summary (Signed)
[name], [name]                ACCOUNT NO.:  [id number]   MEDICAL RECORD NO.:  [id number]          PATIENT TYPE:  IPS   LOCATION:  0600                          FACILITY:  [facility]   PHYSICIAN:  [name], M.D. DATE OF BIRTH:  1980/05/23   DATE OF ADMISSION:  09/16/2007  DATE OF DISCHARGE:  09/20/2007                               DISCHARGE SUMMARY   IDENTIFICATION:  This is a 30 year old single African American male who  was admitted who was admitted on September 16, 2007.   HISTORY OF PRESENT ILLNESS:  The patient states she has been suicidal  following conflict with this baby's mama over her demand for child  support.  He feels it is unfair because she lives with him and he pays  the bills.  He has left-sided hemiplegia.  He has been having history of  suicidal thoughts x1 week.  He has been having trouble sleeping.  His  appetite varies.   PAST PSYCHIATRIC HISTORY:  The patient was here in November 2008.  There  is no current psychiatric treatment.   FAMILY HISTORY:  None.   ALCOHOL AND DRUG HISTORY:  The patient denies.   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   MEDICATIONS:  1. Prozac 20 mg daily.  2. Keflex.  3. Bactrim.  4. OxyContin 40 mg q.12 h rather.   DRUG ALLERGIES:  ULTRAM, CIPRO.   PHYSICAL FINDINGS:  The left knee was wrapped in Kerlix.  There were no  other acute medical or physical problems noted other than the deficits  from his left hemiparesis.   ADMISSION LABORATORY:  CBC revealed a platelet count of 515.   HOSPITAL COURSE:  Upon admission, the patient was restarted on his  Prozac 20 mg daily, Bactrim 1 pill p.o. b.i.d., OxyContin 40 mg q.12 h.,  Zanaflex 4 mg t.i.d., Nexium 40 mg p.o. q.h.s.  He was also started on  trazodone 50 mg p.o. q.h.s.  On September 16, 2007, he was started on Lortab  10/500, 1 tablet t.i.d. p.r.n. breakthrough pain.  He was  also started  on Keflex 500 mg p.o. q.8 h.  The patient tolerated these medications  well with no significant side effects.  In individual sessions with me,  the patient was reserved, but cooperative.  He remembered me from his  last admission here.  He initially was lying in bed and had difficulty.  Attending unit therapeutic groups and activities.  This improved as his  hospitalization progressed.  Stressors revolved around the conflict with  his baby's mama.  He states he does not have a psychiatrist.  He admits  to periodic marijuana use.  He continued to feel depressed, sad, and  anxious, but denied any suicidal ideation.  On September 20, 2007, mental  status had improved markedly from admission status.  The patient was  less depressed, less anxious.  Affect was consistent with mood.  There  was no suicidal or homicidal ideation.  No thoughts of self-injurious  behavior.  No  auditory or visual hallucinations.  No paranoia or  delusions.  Thoughts were logical and goal-directed.  Thought content,  no predominant theme.  Cognitive was baseline.  Judgment intact.  Insight intact.  We had family contact with his sister who was coming to  pick him up.  She said the whole family is very supportive of [name].  She felt he is ready for discharge and is not a danger to himself and  others.  He was going to be followed up at the [facility] on September 24, 2007.  The patient' counselor also spoke with the patient's father  who said the patient is ready for discharge, father said he is  supportive of the patient.  It was decided he was going to live with his  sister temporarily to remove himself from the situation with his  girlfriend.   DISCHARGE DIAGNOSES:  Axis I:  Major depression recurrent, severe  without psychosis.  Axis II:  None.  Axis III:  Chronic left knee wound.  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  Axis IV:  Moderate (problems with primary support group, economic  problem,  medical problems).  Axis V:  Global assessment of functioning was 50 at discharge.  GAF was  35 upon admission.  GAF highest past year was 60-65.   DISCHARGE PLAN:  There was no specific activity level or dietary  restrictions.   POSTHOSPITAL CARE PLANS:  The patient will see Dr. Joni Reining at the  [facility] on Thursday September 24, 2007 at 10:38  a.m.   DISCHARGE MEDICATIONS:  1. Prozac 20 mg daily.  2. Bactrim DS 1 p.o. b.i.d.  3. OxyContin 40 mg SR 1 q.12 h.  4. Zanaflex 4 mg t.i.d.  5. Protonix 80 mg p.o. q.h.s.  6. Keflex 500 mg p.o. q.8 h.  7. Trazodone 150 mg take one-third to 1 pill at h.s. p.r.n. insomnia.      [name], M.D.  Electronically Signed     BHS/MEDQ  D:  10/01/2007  T:  10/02/2007  Job:  [id number]
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
## 2010-07-31 NOTE — Assessment & Plan Note (Signed)
[facility]   NAME:  [name], [name]                ACCOUNT NO.:  [id number]   MEDICAL RECORD NO.:  [id number]      DATE OF BIRTH:  [birth year]   PHYSICIAN:  Jake Shark A. Tanda Rockers, M.D. VISIT DATE:  07/14/2007                                   OFFICE VISIT   SUBJECTIVE:  [name] is a 30 year old man who we have followed for an  open wound involving the tendonitis of the left patella tendon and  possible osteo of the anterior typical tubercle.  In the interim, he has  been dressing this own wound with an iodoform gauze.  There has been  significant decrease in pain.  There has been no drainage and no  malodor.   OBJECTIVE:  Blood pressure is 126/66, respirations 16, pulse rate 91,  temperature 97.8.  The patient is unaccompanied.  Inspection of the left  lower extremity shows that the wound remains open, chronic granulation  at its base.  Sounding of the wound with a Q-tip shows that it is not  particularly tender.  There is significant undermining from 9-3 o'clock.  There is no malodorous drainage, hyperemia, or associated effusion.The  interim culture showed normal flora.   ASSESSMENT:  Clinical improvement.   PLAN:  We will discontinue the iodoform gauze packing and substitute a  plain quarter-inch gauze packing to be placed daily.  The patient will  continue to use an antibacterial soap and tap water to wash and cleanse  this wound thoroughly allowing for agitation into the depth of the  wound.  He is to continue his level of activity.  He is to wear a  compressive Ace wrap.  We will re-evaluate him in 2 weeks.      Harold A. Tanda Rockers, M.D.  Electronically Signed     HAN/MEDQ  D:  07/14/2007  T:  07/14/2007  Job:  [id number]
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
## 2010-07-31 NOTE — H&P (Signed)
[name], [name]                ACCOUNT NO.:  [id number]   MEDICAL RECORD NO.:  [id number]          PATIENT TYPE:  OBV   LOCATION:  1826                         FACILITY:  [facility]   PHYSICIAN:  Kela Millin, M.D.DATE OF BIRTH:  March 10, 1981   DATE OF ADMISSION:  12/12/2006  DATE OF DISCHARGE:                              HISTORY & PHYSICAL   PRIMARY CARE PHYSICIAN:  Dr. Dorothe Pea.   CHIEF COMPLAINT:  Headaches.   HISTORY OF PRESENT ILLNESS:  The patient is a 30 year old black male  with past medical history significant for a history of  meningitis/encephalitis with coma for 8 months in 1995, depressive  disorder, NOS, history of left knee prepatellar bursitis with MRSA  infection -- status post I&D in 2006, who presents with complaints of  severe headaches.  He states that he had come to the ER about 2 days ago  -- on December 11, 2006 -- with complaints of headaches and at that  time, was only on the right side of his head and not as severe and a  lumbar puncture was done and the fluid sent for studies; the culture are  no growth up to date, white cell count in CSF #1, no rbc's, CSF glucose  56, CSF protein 18; negative for meningitis.  The patient was then  discharged home and he states that last p.m., September 26, he began  having different kind of headaches -- more intense, pounding and all  over his head and associated with photophobia and dizziness; he also had  some nausea and vomiting.  He states that the headaches were worse with  standing.   In the ER, the patient was given Toradol and morphine without relief and  so he was also given Dilaudid and following that, he reported some  improvement -- from 10/10 in intensity initially to 7/10 in intensity at  the time he was interviewed.  He is admitted for observation and further  management.   PAST MEDICAL HISTORY:  1. As above.  2. 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  3. History of depressive disorder, NOS -- noted patient was      hospitalized at [facility], August 2008, for this.   MEDICATIONS:  1. Prozac 20 mg daily.  2. Xanax 0.25 mg b.i.d. p.r.n.   ALLERGIES:  No known drug allergies.   SOCIAL HISTORY:  He states that he smokes 1 cigarette per day and as  above, he has been sober since March 17, 2006.   FAMILY HISTORY:  Both parents have hypertension.   REVIEW OF SYSTEMS:  As per HPI, other review of systems negative.   PHYSICAL EXAMINATION:  GENERAL:  The patient is a young black male.  He  was initially lying down in the fetal position, but subsequently was  able to sit up in the chair (following Dilaudid), poor oral hygiene, in  no respiratory distress, intermittently displays some tic-like  movements.  VITAL SIGNS:  Temperature is 97.1, blood pressure 121/73 with a pulse of  83, respiratory rate  20, O2 SAT 98%.  HEENT:  PERRL.  EOMI.  Muddy sclerae.  No oral exudates.  Slightly dry  mucous membranes.  NECK:  Supple, no adenopathy, no nuchal rigidity, no thyromegaly.  LUNGS:  Clear to auscultation bilaterally, no crackles or wheezes.  CARDIOVASCULAR:  Regular rate and rhythm, normal S1 and S2.  ABDOMEN:  Soft, bowel sounds present, nontender and non-distended.  No  organomegaly and no masses palpable.  EXTREMITIES:  No cyanosis and no edema.  NEUROLOGIC:  He is alert and oriented x3.  Cranial nerves II-XII are  grossly intact.  His strength is 5/5 and symmetric and nonfocal.   LABORATORY DATA:  CSF results from September 25 as per HPI.  His white  cell count is 8.1, hemoglobin 11.8, hematocrit 34.6, platelet count of  397,000, neutrophil count of 57%.   ASSESSMENT AND PLAN:  1. Probable spinal headaches -- as discussed above, a 30 year old male      presenting status post lumbar puncture with headaches that are      worse with standing, associated with dizziness and visual changes,      as well as nausea and vomiting.  We will keep  the patient on      bedrest, analgesics for pain management and further observe.  If no      improvement today, we will set up for a blood patch.  2. Depressive disorder, not otherwise specified -- continue      preadmission medications.      Kela Millin, M.D.  Electronically Signed     ACV/MEDQ  D:  12/13/2006  T:  12/13/2006  Job:  [id number]   cc:   Jethro Bastos, M.D.
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
## 2010-07-31 NOTE — H&P (Signed)
[name], [name]                ACCOUNT NO.:  [id number]   MEDICAL RECORD NO.:  [id number]          PATIENT TYPE:  IPS   LOCATION:  0603                          [facility]ACILITY:  [facility]   PHYSICIAN:  Jasmine Pang, M.D. DATE OF BIRTH:  02/24/1981   DATE OF ADMISSION:  10/17/2007  DATE OF DISCHARGE:                       PSYCHIATRIC ADMISSION ASSESSMENT   The patient presented to [facility].  He reported  that he was, once again, having conflict with his baby's mom.  He was  suicidal with the plan to lay down on the railroad tracks.  He denied  any homicidal ideation.  He denied any hallucinations or delusions.  Mr.  [name] was recently with [place], July 1 to July 5, for the exact same  reasons.  He stabilized during that hospitalization and went to live  with his sister.  It is unclear whether he has returned to his prior  situation with the girlfriend or not.  He continues to feel it is unfair  that she lives with him and he pays the bills.   PAST PSYCHIATRIC HISTORY:  As already stated, he was last with [place] July  10 to July 05, same scenario, and he has been with [place] in the past.   SOCIAL HISTORY:  He reports having finished [employer] in  2002 with a degree in liberal arts.  He is not employed at present and  he is the father of a one-year-old daughter; [name] is her name.   FAMILY HISTORY:  None.   ALCOHOL AND DRUG HISTORY:  There is no history for that.  His primary  care [name] is unknown.   MEDICAL PROBLEMS:  Fourteen years ago, he had meningitis and  encephalitis with resultant left hemiparesis.  He also has a chronic  left-knee wound.   MEDICATIONS:  When discharged on July 05, he was on:  1. Prozac 20 mg p.o. daily.  2. Bactrim-DS one p.o. b.i.d.  3. OxyContin 40 mg SR one every 12 hours.  4. Zanaflex 4 mg t.i.d.  5. Protonix 80 mg p.o. at h.s.  6. Keflex 500 mg p.o. every 8 hours.  7. Trazodone.  He could take 50 to 150 mg at h.s. p.r.n.  insomnia.   DRUG ALLERGIES:  He reports he is allergic to VICODIN and ULTRAM.  He  states it makes him have hives and he shakes, and CIPRO makes him  nauseous, as does IBUPROFEN.   POSITIVE PHYSICAL FINDINGS:  He continues to have a bandage on his left  knee.  He has his left hemiparesis.  He has no other acute findings  today.  His vital signs on admission show he is 6 feet 4, weighs 158,  temperature is 98.2, blood pressure is 112/41 to 133/76, pulse was 76,  respirations were 20.  He has an old trache scar and he has an old wound  on his left knee that is still open.   We will repeat his CBC, UA and metabolic profile just to check on where  he is with his infection.   MENTAL STATUS EXAM TODAY:  He  is alert and oriented.  He is casually  groomed in a hospital gown.  He appears to be adequately groomed and  nourished.  His speech:  He has speech difficulties due to his prior  stroke.  His thought processes are not clear, rational or goal-oriented.  He still feels that it is his money that his daughter's mother should  not be requesting child support, etc.  Judgment and insight are poor.  Concentration and memory are intact.  Intelligence is average to  borderline.  He still reports that he is suicidal, although he cannot  explain how killing himself would benefit the situation.  He is not  homicidal and he denies auditory or visual hallucinations.   DIAGNOSIS:  AXIS I:  Major depressive disorder, severe, recurrent,  without psychotic features.  AXIS II:  Deferred.  AXIS III:  Encephalopathy with left hemiparesis.  AXIS IV:  Relationship problems.  AXIS V:  35.   PLAN:  The plan is to admit for safety and stabilization.  We will have  the case manager check on the social situation on Monday, as he is back  living with the child's mother.  Estimated length of stay is 3-5 days.      Mickie Leonarda Salon, P.A.-C.      Jasmine Pang, M.D.  Electronically Signed     MD/MEDQ  D:  10/17/2007  T:  10/17/2007  Job:  [id number]

## 2010-07-31 NOTE — Assessment & Plan Note (Signed)
[facility]   NAME:  [name], [name]                ACCOUNT NO.:  [id number]   MEDICAL RECORD NO.:  [id number]      DATE OF BIRTH:  Apr 03, 1980   PHYSICIAN:  Maxwell Caul, M.D.      VISIT DATE:                                   OFFICE VISIT   HISTORY:  Mr. Min returns today for review of a chronic wound on his  left knee.  The feeling in the wound care center was that this might  represent a chronic osteomyelitis.  Last week, I saw this and packed it  with Aquacel AG.   He has been since reviewed by Dr. Cliffton Asters of infectious disease.  He did not think he has septic arthritis and found the evidence of  osteomyelitis to be less than overwhelming.  He did not feel he wanted  to continue antibiotics pending an evaluation by orthopedics which is  apparently going to happen next week.   On examination, the area in question is on his left knee just inferior  to the patella.  Although the area measures a very small dimension at  0.7 x 0.3, there is considerable amount of overhang here extending over  a wide area of the patella.  There is no evidence of active cellulitis.   IMPRESSION:  Chronic wound in possible association with osteomyelitis,  although Dr. Orvan Falconer did not feel he had active osteomyelitis  currently.  I have elected to repack the area with  Aquacel AG.  We are Ace wrapping him as well.  He is due to see  orthopedics next week.  I think at some point in time this entire area  is going to need to be reopened.  The other option would be to just  leave this with a chronic wound.  I do not think this is going to heal  without surgical intervention.  The area of undermining over the patella  is many times the diameter of the actual wound itself.  However, there  is no evidence of active acute infection.  We will see him again next  week.           ______________________________  Maxwell Caul, M.D.     MGR/MEDQ  D:  06/12/2007  T:   06/13/2007  Job:  [id number]
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
## 2010-07-31 NOTE — Assessment & Plan Note (Signed)
[facility]   NAME:  [name], [name]                ACCOUNT NO.:  [id number]   MEDICAL RECORD NO.:  [id number]      DATE OF BIRTH:  1980/03/27   PHYSICIAN:  Maxwell Caul, M.D. VISIT DATE:  06/05/2007                                   OFFICE VISIT   [name] returns today for review of a chronic wound on the left knee.  He was a repetitive no-show for hyperbaric oxygen treatment, therefore  is removed from this program.  He arrived in here with no dressing on  this wound.  I have reviewed information available to me.  I have  reviewed his biopsy results which only showed chronic inflammation.  His  two cultures showed coag negative staph and diphtheroids which are  common skin contaminants and probably not indicative of true infection.  The patient continues to tell me that he is in extreme pain with this.  He has an appointment with infectious disease next week.  He is asking  about surgery.  Apparently this has been done before by Dr. Magnus Ivan, I  do not have these records.   PHYSICAL EXAMINATION:  His temperature is 97.6, blood pressure 120/74,  respirations 16.  The wound opening has improved measuring 0.7 x 0.2.  However, this wound probes widely and deeply at least 2-3 cm in a wide  arc around this opening.  I did not repeat the cultures.  The periwound  tissue is swollen, warm and probably erythematous.   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  I have repacked  the area with Aquacel AG.  I have talked to him about not getting this  wet.  We will Ace wrap this and see him in a week.  At that point, he  should have had an infectious disease appointment for antibiotic  guidance.  If there is another surgical option here, I do not know what  it is and I have not explored this further.           ______________________________  Maxwell Caul, M.D.     MGR/MEDQ  D:  06/05/2007  T:  06/05/2007  Job:  [id number]

## 2010-07-31 NOTE — Assessment & Plan Note (Signed)
[facility]   NAME:  [name], [name]                ACCOUNT NO.:  [id number]   MEDICAL RECORD NO.:  [id number]      DATE OF BIRTH:  [birth year]   PHYSICIAN:  Jake Shark A. Tanda Rockers, M.D. VISIT DATE:  05/07/2007                                   OFFICE VISIT   SUBJECTIVE:  [name] is a 30 year old man whom we are following for  chronic ulcer on the anterior proximal tibia.  The working diagnosis is  osteomyelitis.  In the interim he has had a biopsy taken from scrapings  of the periosteum along with a culture.  He is also complaining of pain  but he has had no fever.   OBJECTIVE:  Blood pressure is 109/48, respirations 16, pulse rate 55,  temperature is 97.8.  Inspection of the left knee shows that the chronic sinus remains open.  It was sounded with a Q-tip disclosing moderate discomfort and a serous  mucopurulent drainage.  There is no excessive local warmth but there is  moderate tenderness.  There is no localized edema or abscess formation.  There is a well perfused-appearing chronic granulation in the depth of  the wound with some soft exudate.  No debridement was needed.  Review of  his pathology shows findings consistent with chronic inflammation.  The  previous culture and gram stain have shown moderate gram-positive cocci  in pairs, in clusters, and abundant diphtheroid.   ASSESSMENT:  Chronic osteomyelitis of the tibia.   PLAN:  We have recommended that this patient undergo hyperbaric oxygen  treatment with broad-spectrum antibiotic coverage.  We are recommending  that he receive initially 30 treatments of 100% oxygen at 2.4  atmospheres with two 5-minute air breaks.  We have reviewed the  indications for hyperbaric treatment, i.e. chronic osteomyelitis, with  the patient in terms that he seems to understand.  We have discussed  specifically the possible complications of barotrauma, oxygen toxicity,  and claustrophobia.  We have given the patient  opportunity to ask  questions.  He seems to comprehend and expresses a desire to proceed  with the treatment as outlined.  In the interim we placed him on Tylox 1  every 8 hours p.r.n. for pain.  In addition, we will start him on Cipro  250 mg p.o. b.i.d.      Harold A. Tanda Rockers, M.D.  Electronically Signed     HAN/MEDQ  D:  05/07/2007  T:  05/07/2007  Job:  [id number]   cc:   A. Chestine Spore, PA  Louanna Raw

## 2010-07-31 NOTE — H&P (Signed)
[name], [name]                ACCOUNT NO.:  [id number]   MEDICAL RECORD NO.:  [id number]          PATIENT TYPE:  IPS   LOCATION:  0301                          [facility]ILITY[facility]  [facility]   PHYSICIAN:  Anselm Jungling, MD  DATE OF BIRTH:  May 03, 1980   DATE OF ADMISSION:  10/31/2006  DATE OF DISCHARGE:                       PSYCHIATRIC ADMISSION ASSESSMENT   IDENTIFYING INFORMATION:  This is a voluntary admission to the services  of Dr. Geralyn Flash.  This is a 30 year old engaged African-American  male.  He presented to the [facility].  He reported that  he was depressed, he was hopeless and overwhelmed due to having a 15-  month-old child.  This is a little girl.  He has financial issues and  medical issues.  He reported increased anxiety, significant weight loss,  he thinks about 40 pounds, insomnia and fatigued.  [place] to lie on the tracks.  At the time of  presentation, he was calm, cooperative and asking for help.   PAST PSYCHIATRIC HISTORY:  He is a recovering alcoholic.  He has been  sober for the past three years.  He has no inpatient history.  He has  had outpatient treatment in the past for depression.  It was brief.  He  thinks that he is currently taking fluoxetine under the guidance of Dr.  Steffanie Dunn but there is no record of that.   SOCIAL HISTORY:  He states that he got to his junior year in college.  He is engaged.  He has a 54-month-old daughter.  He receives disability  and Medicaid.  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   ALCOHOL/DRUG HISTORY:  He smokes two cigarettes a day and he has three  years sobriety from alcohol.   PRIMARY CARE PHYSICIAN:  His primary care physician is Dr. Steffanie Dunn.   MEDICAL PROBLEMS:  He is status post meningitis and encephalitis putting  him into a coma for about eight months back in 1995.  Part of his  hospitalization was here at the [facility].  Back in February,  he was status post irrigation and debridement of left knee, chronic  prepatellar bursal infection.  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   MEDICATIONS:  His medications today are Xanax 0.25 mg p.o. a.m. and  h.s., Vicodin 5/500 mg, 2 p.o. q.6h., doxycycline 100 mg p.o. b.i.d.  He  states that he is given Prozac by Dr. Steffanie Dunn but I could not verify  that but we will restart 20 mg p.o. q.a.m.   ALLERGIES:  No known drug allergies.   POSITIVE PHYSICAL FINDINGS:  His vital signs show he is 6 feet 1 inch,  he weighs 159 pounds, temperature is 97.2, blood pressure 144/89, pulse  97, respirations 18.  He does have a trach scar and he does have a scar  from where he had a feeding tube and he does have a quarter-size lesion  that is healing on his left kneecap.  He  does need dressings on that  daily.  He does have some contractures secondary to his extensive  comatose illness and he has difficulty speaking.  Also, his gait is  quite abnormal due to his contractures.   LABORATORY DATA:  His labs on admission show that his TSH is normal at  2.183.  His labs showed no abnormalities of his chemistries or his liver  function and his CBC was within normal limits as well.   MENTAL STATUS EXAM:  He is alert and oriented x3.  He is appropriately  groomed, dressed and he reports a severe weight loss although there is  no sagging skin, etc.  His speech is quite difficult to understand but  eventually he is able to make his thoughts known.  His mood is  depressed.  His affect is blunted.  His thought processes are relatively  clear, rational and goal-oriented.  He states he just wants to get some  sleep.  He wants to be discharged as he has a doctor's appointment on  Monday and his fiance's birthday is also Monday.  He did not seem to  have any delusions or be paranoid.  His judgment and insight are poor.  His concentration and  memory seem to be intact.  His intelligence is at  least average.  He denies being suicidal or homicidal today.  He denies  auditory or visual hallucinations.   DIAGNOSES:  AXIS I:  Major depressive disorder, severe without psychotic  features.  AXIS II:  Deferred.  AXIS III:  He is still healing an infection of the left prepatellar  bursal area and we will continue the __________ dressings that are  ordered as well as the doxycycline, he is status post meningitis and  encephalitis in 1985 and was in a coma for eight months.  AXIS IV:  Severe.  AXIS V:  30.   PLAN:  To admit for safety and stabilization.  Will adjust his  medications by adding Zyprexa Zydis at h.s. and he is requesting  discharge on Monday.  We will have a family session and see how  everybody feels about that.      [name], P.A.-C.      Anselm Jungling, MD  Electronically Signed    MD/MEDQ  D:  11/01/2006  T:  11/01/2006  Job:  [phone]

## 2010-07-31 NOTE — Group Therapy Note (Signed)
NAMEMILOH, [name]                ACCOUNT NO.:  [id number]   MEDICAL RECORD NO.:  [id number]          PATIENT TYPE:  EMS   LOCATION:  ED                           FACILITY:  [facility]   PHYSICIAN:  Syed T. Arfeen, M.D.   DATE OF BIRTH:  Jul 28, 1980                                 PROGRESS NOTE   The patient is 30 year old African American man who came here for his  appointment.  The patient was recently discharged from the [facility] after feeling depressed and having suicidal  thoughts.  The patient is known to me from upstairs.  He has multiple  hospitalizations in [facility] due to noncompliance of  medication and resulted in suicidal thoughts.  He admitted that he has  stopped taking his medication.  He gets overwhelmed and depressed to the  point of having suicidal thoughts.  Today, the patient came in with his  fiance.  They have a 52-year-old daughter together.  The patient was  discharged on Zyprexa 5 mg and Celexa 20 mg daily.  The patient was  scheduled to see Dr. Lang Snow at [facility].  However,  he missed the appointment last week and is afraid he will be out of his  medication, and today he needed a refill of his medication.  The patient  reported that he has been doing very well since he was compliant with  the Celexa and Zyprexa.  He has been sleeping well.  He denied any  suicidal thoughts.  He feels calm and pleasant.  He reported no side  effects from the medication and would like to go back to see Dr. Lang Snow  next week.   PAST PSYCHIATRIC HISTORY:  The patient has multiple admission in [facility] due to  depression and marijuana abuse.  He has done well on Zyprexa in the  past.   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  He also has a chronic left knee wound.  He  uses a cane to walk.  He has a history of tendonitis,  osteomyelitis of  his left lower leg.   PSYCHOSOCIAL HISTORY:  The patient lives with his fiance and 31-year-old daughter.  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  He is on disability due to the history of encephalitis which causes  motor ataxia.  He denies any sexual, physical or verbal abuse in the  past.  He was born and raised in [place].   FAMILY MENTAL HEALTH HISTORY:  The patient denies any other person in the family who has mental  illness, work history as mentioned.  He is on disability due to his  physical illness.   SUBSTANCE ABUSE:  The patient admitted that he has history of using marijuana.  He also  admitted that his last admission was also precipitated by marijuana  abuse.  However, currently he has been sober since released from the  hospital and feels his medicine is helping  him.   MENTAL STATUS EXAM:  The patient is casually dressed.  He is cooperative, pleasant, coherent.  He is groomed and walks with the help of a cane.  His motor ability  appears to be at baseline as he has dysarthria and difficulty in  articulation, but he is able to give a coherent history.  As mentioned,  his speech has articulation issue but is relevant.  His mood appears to  be euthymic affect and mood congruent.  He denies any auditory  hallucinations, suicidal thoughts or homicidal thoughts.  No paranoia  delusion or obsession noted.  He has some thought blocking, but that  could be due to his physical impairment.  He has a good insight and  judgment and impulse control.  His recent and remote memory is intact.  He is alert and oriented x3.   DIAGNOSES:  AXIS I:  Major depressive disorder, recurrent  cannabis abuse.  AXIS II:  Deferred.  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  Chronic  left knee wound.  AXIS IV:  Mild to moderate.  AXIS V:  55.   PLAN:  Since release from the hospital, the patient has been  compliant with his  Celexa ends Zyprexa 5 mg.  He has been doing very well and reported no  side effects of medication.  He still has Zyprexa that can last until  his next appointment with Dr. Lang Snow at [facility].  However, he is almost out of the Celexa and requesting to refill the  Celexa at this time.  We will give him a prescription of Celexa 20 mg 1  daily.  I explained the risks and benefits of the medication in detail.  He will follow up with [facility] with Dr. Lang Snow  next week.  I asked him if he needs any further information, he can  contact us or he has any questions and feels his depression is getting  worse he can reach [place] for help.      Syed T. Lolly Mustache, M.D.  Electronically Signed     STA/MEDQ  D:  11/02/2008  T:  11/02/2008  Job:  [id number]

## 2010-07-31 NOTE — Op Note (Signed)
[name], Gary                ACCOUNT NO.:  [id number]   MEDICAL RECORD NO.:  [id number]          PATIENT TYPE:  INP   LOCATION:  5008                         FACILITY:  [facility]   PHYSICIAN:  Vanita Panda. Magnus Ivan, M.D.DATE OF BIRTH:  07-16-1980   DATE OF PROCEDURE:  11/18/2006  DATE OF DISCHARGE:  11/14/2006                               OPERATIVE REPORT   PREOPERATIVE DIAGNOSIS:  Left chronic open knee wound with chronic  bursal infection.   POSTOPERATIVE DIAGNOSIS:  Left chronic open knee wound with chronic  bursal infection.   PROCEDURE:  1. Irrigation and debridement of left knee chronic open bursal wound.  2. Delayed primary closure of wound.   SURGEON:  Doneen Poisson, M.D.   ANESTHESIA:  General.   ANTIBIOTICS:  1 gram IV vancomycin.   BLOOD LOSS:  Minimal.   COMPLICATIONS:  None.   INDICATIONS:  Briefly, [name] is a 30 year old who has had greater than  five and no less than ten operations on the left knee, chronic bursal  section over the years.  This ended up at one time being a MRSA  infection.  Every time he has had the wound closed, it opens up again.  He has had bone scans as well as an MRI that showed no involvement of  the bone, whatsoever.  On his recent presentation, he has serous  drainage from the wound but no gross purulence.  His white blood cell  count is 7000 peripherally only.  He has always had knee pain with his  knee and is on chronic pain medication for this.  He presents for repeat  irrigation and debridement.   DESCRIPTION OF PROCEDURE:  After informed consent was obtained and the  appropriate left leg was marked, Ella was brought to the operating room  and placed on the operating table.  General anesthesia was then  obtained. The leg was prepped room with Betadine scrub and paint.  A  sterile dressings and sterile stockinette were placed. A time out was  called to identify the correct patient and correct extremity.  I then  excised the margins of the open wound back to bleeding tissue using a  rongeur as well as a knife.  I excised the chronic appearing tissue in  his knee.  Of note, there was no gross purulence encountered,  whatsoever.  After a thorough and extensive debridement of the wound, I  then copiously irrigated with 3 liters normal saline using pulsatile  lavage and then a bulb syringe with bacitracin solution to thoroughly  clean out the knee.  I then was able to reapproximate the deep tissue  with interrupted 0 Vicryl suture followed by closing the skin with  interrupted 2-0 nylon suture and this was not a not tight closure.  I  then placed Adaptic over the wound followed by a well padded sterile  dressing, sterile Coban, and Ace  wrap. His knee was placed in a knee immobilizer, as well.  He was then  awakened, extubated, and taken to the recovery room in stable condition.  He will be admitted for 23-hour extended  outpatient recovery/observation  for IV antibiotics and then discharged to home tomorrow.      Vanita Panda. Magnus Ivan, M.D.  Electronically Signed     CYB/MEDQ  D:  11/18/2006  T:  11/18/2006  Job:  [id number]

## 2010-07-31 NOTE — Discharge Summary (Signed)
[name], [name] NO.:  [id number]   MEDICAL RECORD NO.:  [id number]          PATIENT TYPE:  IPS   LOCATION:  0304                          [facility]  [facility]   PHYSICIAN:  [name] [name], M.D. DATE OF BIRTH:  November 27, 1980   DATE OF ADMISSION:  01/15/2008  DATE OF DISCHARGE:  01/19/2008                               DISCHARGE SUMMARY   IDENTIFICATION:  This is a 30 year old single African American male who  was admitted on a voluntary basis on January 15, 2008.   HISTORY OF PRESENT ILLNESS:  The patient presented to [facility]  Emergency Department.  He stated he felt suicidal.  He stated that he  had been having these feelings off and on for the past week or so my  baby's mama got on my nerves.  He denied alcohol or drug use.  Indeed,  his UDS was negative.  He had no alcohol on board.  This is his 3rd  admission with the same complaint.  [name] [name] has been with [place] from  July 1st to September 19, 2097 and August 1st to October 19, 2007.   FAMILY HISTORY:  The patient denies anybody having any psychiatric  illness.   ALCOHOL AND DRUG HISTORY:  There is no history noted.   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  He also had a chronic left knee wound.  He  was just seen in [facility] for this wound yesterday.  He is not  prescribed any antibiotics.  It is now healing by secondary intention  and it is clean.   MEDICATIONS:  When last discharge in August, he was taking:  1. Prozac 20 mg p.o. q.day.  2. Zanaflex 4 mg p.o. t.i.d.  3. Protonix 40 mg at bedtime.  4. Septra DS 1 tablet b.i.d.  5. OxyContin 40 mg SR every 12 hours.  6. Trazodone 1 to 1-1/2 pills at bedtime.   DRUG ALLERGIES:  He states that ULTRAM nauseates him.  CIPRO makes him  break out in hives, VICODIN disrupts his breathing, and IBUPROFEN causes  GI distress.   PHYSICAL FINDINGS:  The patient was ambulating without assistance.  His  left knee has about a 50 cent piece size open area that is healing by  secondary intention and is clean.  The remainder of the physical exam  was not acute.  He was medically cleared in the ED at [facility].  He has old scars list and self-inflicted superficial  lacerations.  He did have mostly in 2006 in his left knee.  He has a  trache scar and a scar on his abdomen as well as a tattoo on his left  forearm.   ADMISSION LABORATORIES:  A comprehensive basic metabolic profile was  grossly within normal limits except for a glucose of 119 (70 to 99).  Alcohol level was less than 5.  CBC with diff was within normal limits.  Urine drug screen was negative.   HOSPITAL COURSE:  Upon admission, the patient was restarted on Prozac 20  mg  p.o. q.day and trazodone 100 mg p.o. q.h.s.  He was also started on  OxyContin 40 mg tablets SR q.12 h. and Zyprexa Zydis 10 mg p.o. q.h.s.  He was also started on Protonix 80 mg p.o. q.h.s.  He tolerated these  medications well with no significant side effects.  In individual  sessions with me, the patient was friendly and cooperative.  He  remembered me as his doctor from previous admissions.  He talked about  his conflict with my baby's mama.  She lives with him and he wants her  out of the house.  His sleep was poor.  Appetite was poor.  He continued  to be depressed and anxious with suicidal ideation, but could contract  for safety.  On January 17, 2008, he was still trying to find ways to  get his baby's [name] [name] out of his house.  He plans to call her father.  He  states he saw his mother last night and felt supported by her.  She had  driven a long way to see him.  He was having no side effects on the  medications.  On January 18, 2008, his sleep was good.  He was not  having active suicidal ideation.  There was some passive thinking world  will be better off without me.  He was becoming less depressed and less  anxious.  On January 19, 2008, a  family session was held with the  patient and his baby's mama.  It was determined in the session that she  was going to move out of his home.  She did indicate she would take the  baby with her.  When she left, that it does not appear to be a problem  with this.  [name] [name] was felt better about her plans to move away.  Sleep  was good and appetite was good.  Mood was less depressed and less  anxious.  Affect was wide range.  There was no suicidal or homicidal  ideation.  No thoughts of self-injurious behavior.  No auditory or  visual hallucinations.  No paranoia or delusions.  Thoughts were logical  and goal-directed.  Thought content no predominant theme.  Cognitive  functioning was back to baseline.  Insight fair.  Judgment fair.  Impulse control fair.  He wants to go home today.  He plans to live with  his sister for a few days after discharge.   DISCHARGE DIAGNOSES:  Axis I:  Bipolar disorder, not otherwise  specified.  Axis II:  Rule out borderline intellectual functioning.  Axis III:  History of encephalitis, history of meningitis, left knee  prior methicillin-resistant Staphylococcus aureus infection in 2006.  He  now has a clean area that is granulating in.  Axis IV:  Moderate (problems with primary support group, burden of  psychiatric illness, burden of medical problems).  Axis V:  Global assessment of functioning 50 upon discharge.  GAF was 40  upon admission.  GAF highest past year was 60 to 65.   DISCHARGE PLANS:  There was no specific activity level or dietary  restrictions.   POSTHOSPITAL CARE PLANS:  The patient will see Dr. Marny Lowenstein, his  primary care physician on November 16th at 10 a.m.  He will also be seen  by [facility] for counseling.   DISCHARGE MEDICATIONS:  1. Prozac 20 mg daily.  2. Trazodone 100 mg at bedtime.  3. Oxycodone 40 mg at 8:00 a.m. and 8:00 p.m.  4. Zyprexa 10 mg  at bedtime.  5. Protonix 80 mg at bedtime.      [name] [name],  M.D.  Electronically Signed     BHS/MEDQ  D:  01/19/2008  T:  01/20/2008  Job:  [id number]

## 2010-07-31 NOTE — Discharge Summary (Signed)
[name], [name]                ACCOUNT NO.:  [id number]   MEDICAL RECORD NO.:  [id number]          PATIENT TYPE:  IPS   LOCATION:  0301                          FACILITY:  [facility]   PHYSICIAN:  Jasmine Pang, M.D. DATE OF BIRTH:  02-14-1981   DATE OF ADMISSION:  05/15/2008  DATE OF DISCHARGE:  05/17/2008                               DISCHARGE SUMMARY   IDENTIFICATION:  This a 30 year old single African American male who was  admitted on a voluntary basis on May 15, 2008.   HISTORY OF PRESENT ILLNESS:  [name] presented to the emergency  department last night reporting difficulty with his girlfriend with his  baby's mama.  He states he is wanting to harm himself.  He denied having  a plan and he continues to have a large open wound to his left knee.  His UDS was completely negative.  He had no alcohol.  His CBC showed  that his platelet count was slightly elevated at 411,000.  His  electrolytes have no abnormalities and he has had no lab work done in  the emergency room.  He had meningitis in the past, also encephalitis.  He has a chronic left knee wound.  This has been under treatment for  well over a year.  He has had numerous localized treatments as well as  hyperbaric oxygenation to the knee.  He reports several prior admissions  to [facility] for suicidal ideation.   PAST PSYCHIATRIC HISTORY:  [name] was with several times last year,  January 21, 2008 to February 01, 2008 and October 30 , 2009 to January 19, 2008, October 17, 2007 to October 19, 2007 and September 16, 2007 to September 20, 2007.  All this resolved around his baby's mother and being unable to  pay bills.   FAMILY HISTORY:  He denies anybody having psychiatric illness.   ALCOHOL AND DRUG HISTORY:  He denies and there is no indication on the  chart that he has substance abuse issues.   MEDICATIONS:  The patient is currently prescribed:  1. OxyContin SR 40 mg every 12 hours.  2. Zanaflex 4 mg  t.i.d.  3. Fentanyl patch changing every 72 hours.  4. Prozac 20 mg daily.  5. Zyprexa 7.5 mg at bedtime.   He states that the Prozac is not working and would like it changed.   DRUG ALLERGIES:  1. VICODIN.  2. IBUPROFEN.  3. CIPRO.   He does not know what the reactions were.   PHYSICAL FINDINGS:  The patient was medically cleared in the emergency  department at [facility] ED.  He had no acute physical or medical  problems noted.   ADMISSION LABORATORIES:  Reported in the history of present illness.   HOSPITAL COURSE:  Upon admission, the patient was started on Zyprexa 5  mg p.o. q.6 h. p.r.n. agitation and psychosis. Prozac was stopped.  He  was started on Cymbalta 30 mg p.o. daily and a nicotine gum protocol as  per smoking cessation protocol.  In individual sessions, the patient was  friendly and cooperative.  He endorsed increasing depression and  suicidal ideation.  He discussed the conflict he had with his baby's  mother.  This has been a chronic conflict.  On May 26, 2008, the  patient stated I feel a lot better.  His sleep was good, appetite was  good.  Mood was less depressed and he was not suicidal.  He wanted  discharge the following day.  Plans to go live with his uncle in  [place].  No visual hallucinations.  No paranoia or delusions.  Thoughts were logical and goal-directed.  Thought content, no  predominant theme.  Cognitive was grossly intact.  On May 17, 2008,  mental status had improved markedly from admission status.  Mood was  euthymic.  Affect was consistent with mood.  There was no suicidal or  homicidal ideation.  No thoughts of self-injurious behavior.  No  auditory or visual hallucinations.  No paranoia or delusions.  Thoughts  were logical and goal directed.  Thought content, no predominant theme.  Cognitive was grossly intact.  Insight good.  Judgment good.  Impulse  control good.  He was felt to be safe for discharge today.   DISCHARGE  DIAGNOSES:  Axis I:  Mood disorder, not otherwise specified.  Axis II:  Features of personality disorder, not otherwise specified.  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  Axis IV:  Severe (problems with his daughter's mother, financial issues,  burden of psychiatric illness, burden of medical problems).  Axis V:  Global assessment of functioning was 50 upon discharge.  GAF  was 30 upon admission.  GAF highest past year was 60-65.   DISCHARGE PLAN:  There was no specific activity level or dietary  restrictions.   POSTHOSPITAL CARE PLANS:  The patient will see Dr. Joni Reining at the  [facility] on May 31, 2008, at 11:30 a.m.  He will also go to  [facility] for counseling and is to call to schedule this.   DISCHARGE MEDICATIONS:  1. Cymbalta 30 mg daily.  2. Zyprexa 5 mg every 6 hours as needed for anxiety or agitation.  3. He is also to resume OxyContin SR and Zanaflex as prescribed by his      primary care doctor.  4. He is to resume fentanyl patch as prescribed by his primary care      doctor.  5. He is to stop the Prozac.      Jasmine Pang, M.D.  Electronically Signed     BHS/MEDQ  D:  05/17/2008  T:  05/18/2008  Job:  [id number]

## 2010-07-31 NOTE — H&P (Signed)
NAMEJARMAN, LITTON NO.:  [id number]   MEDICAL RECORD NO.:  [id number]          PATIENT TYPE:  IPS   LOCATION:  0304                          FACILITY:  [facility]   PHYSICIAN:  Jasmine Pang, M.D. DATE OF BIRTH:  01-Oct-1980   DATE OF ADMISSION:  01/15/2008  DATE OF DISCHARGE:                       PSYCHIATRIC ADMISSION ASSESSMENT   This is a voluntary admission to the services of Dr. Milford Cage.   IDENTIFYING INFORMATION:  This is a 30 year old single [place] American  male.  He presented to the [facility] Emergency Department.  He stated  that he felt suicidal.  He stated that he had been having these off and  on for the past week or so.  My baby momma get on my nerves.  He  denied alcohol or drug use.  Indeed, his UDS was negative.  He had no  alcohol on board.  This is his third admission with the same complaint.  Mr. Delapena has been with [place] from July 1 to September 20, 2007 and from August  1 to October 19, 2007.   SOCIAL HISTORY:  He says that he finished [employer] in 2002  with a degree is liberal arts.  He is not employed.  He received his  disability.  He has a daughter.  Her name is Destiny, and she is a  little over a year old.   FAMILY HISTORY:  He denies anybody having any psychiatric illnesses.   ALCOHOL/DRUG HISTORY:  He denies.  There is no history noted in the  chart.   PRIMARY CARE PHYSICIAN:  Jethro Bastos, M.D.  He is also seen at  [facility].   MEDICAL PROBLEMS:  Fourteen years ago, he had meningitis and  encephalitis with resultant left hemiparesis.  He also has a chronic  left knee wound.  He was just seen in [facility] for this wound  yesterday.  He is not prescribed any antibiotics.  It is now healing by  secondary intention, and it is clean.   MEDICATIONS:  When last discharged in August, he was taking Prozac 20 mg  p.o. daily, Zanaflex 4 mg t.i.d., Protonix 40 mg at bedtime, Septra DS 1  tablet b.i.d., OxyContin 40  mg SR every 12 hours, and trazodone 1 to 1-  1/2 pills at bedtime.   DRUG ALLERGIES:  He states that ULTRAM nauseates him, CIPRO makes him  break out in hives, VICODIN disrupts his breathing, and IBUPROFEN, his  stomach gets messed up.   PERTINENT PHYSICAL FINDINGS:  He was ambulating without assistance  tonight.  His left knee has about a 50-cent-piece-size open area that is  healing by secondary intention.  It is clean.  The remainder of his  physical exam was not acute.  He was medically cleared in the ED at  [facility].  His vital signs on admission show that he is 73-1/2 inches tall.  He  weighs 144.  His temperature was 98.6.  His BP was 132/73.  Pulse was  92.  He has old scars from self-inflicted superficial lacerations.  He did  have MRSA in 2006  of his left knee.  He has a trach scar and a scar on  his abdomen as well a tattoo on his left forearm.  MENTAL STATUS:  Tonight, he is alert and oriented.  He is casually  dressed in hospital attire.  He appears to be adequately groomed and  nourished.  As already stated, he is walking ad lib.  His motor is  compromised by his prior left hemiparesis.  His speech, he was having a  little more difficulty speaking tonight.  His mood:  He reports that he  has sudden changes.  He asks, Am I bipolar?  He also reports that he  punches at the wall at times.  Thought processes are clear, rational,  and goal-oriented.  He wants to get his baby's mother out of his house.  Judgment and insight are poor.  Concentration and memory are fair.  Intelligence is average to below.   DIAGNOSES:   AXIS I:  Major depressive disorder without psychotic features, rule out  underlying mood disorder as patient is questioning this.   AXIS II:  Rule out borderline intellectual functioning.   AXIS III:  1. History for encephalitis.  2. History for meningitis.  3. Left knee prior methicillin-resistant staphylococcus aureus      infection in 2006.  He now  has a clean area that is granulating in.   AXIS IV:  Problems with primary support group.   AXIS V:  1.   The plan is to admit for safety and stabilization.  His Prozac and  trazodone had already been continued as well as his OxyContin.  Will  check on the Zanaflex and will give him some Zyprexa Zydis 10 mg nightly  to help with his mood and sleep.   Estimated length of stay is 3-5 days, and we will have the counselor  call Child Protective Services to make sure that the home environment is  safe for this infant.      Mickie Leonarda Salon, P.A.-C.      Jasmine Pang, M.D.  Electronically Signed    MD/MEDQ  D:  01/15/2008  T:  01/15/2008  Job:  [id number]

## 2010-07-31 NOTE — H&P (Signed)
[name], [name]                ACCOUNT NO.:  [id number]   MEDICAL RECORD NO.:  [id number]          PATIENT TYPE:  INP   LOCATION:  2041                         FACILITY:  [facility]   PHYSICIAN:  Wendi Snipes, MD DATE OF BIRTH:  Jan 31, 1981   DATE OF ADMISSION:  06/25/2008  DATE OF DISCHARGE:                              HISTORY & PHYSICAL   PRIMARY CARE PHYSICIAN:  [facility] in [place].   CHIEF COMPLAINTS:  Chest pain.   HISTORY OF PRESENT ILLNESS:  This is a [name]  with a history of childhood encephalitis who is here with chest pain  since 12 o'clock today.  The patient states that the pain occurred  abruptly and is described as his heart is jumping through his chest.  He  describes some pleuritic chest pain that is also improved when sitting  straight up.  He does not report a recent illness, but during his  episode today, he had some coughing and spit up some blood.  Otherwise,  he denies syncope, presyncope, palpitations, increased lower extremity  edema, fevers, abdominal pain, diarrhea or others signs of systemic  illness.   PAST MEDICAL HISTORY:  1. Meningitis/encephalitis.  2. Depression.  3. Insomnia.   FAMILY HISTORY:  Reviewed and noncontributory.   SOCIAL HISTORY:  He has a history of substance abuse.  He is engaged.  He is a current smoker   ALLERGIES:  IBUPROFEN, VICODIN AND CIPRO.   MEDICATIONS ON ADMISSION:  1. Zanaflex 4 mg three times daily.  2. Fentanyl patch every 72 hours.  3. Prozac 20 mg daily.  4. Zyprexa 7.5 mg at night.  5. OxyContin 40 mg twice daily.   REVIEW OF SYSTEMS:  All 14 systems were reviewed were negative except as  mentioned in detail in the HPI.   PHYSICAL EXAMINATION:  VITAL SIGNS:  Blood pressure is 125/84,  respiratory rate is 16, pulse is 80 and regular.  He is afebrile.  GENERAL:  He is a [name] appearing stated age  in no acute distress.  HEENT:  He  has poor dentition.  Moist mucous membranes.  Pupils equal,  round, react to light and accommodation.  NECK:  No jugulovenous distention.  No thyromegaly.  CHEST:  Clear to auscultation bilaterally.  HEART:  Regular rate and rhythm.  No murmurs, rubs or gallops.  ABDOMEN:  Nontender, nondistended.  Positive bowel sounds.  EXTREMITIES:  He has a contracted left upper extremity.  No clubbing,  cyanosis or edema.  NEUROLOGIC:  Alert and oriented x3.  Cranial nerves II-XII grossly  intact.  No focal neurologic deficits.   LABORATORY REVIEW:  Potassium is 3.8, BUN is 12, creatinine 0.7, glucose  78, white count is 8.6, hematocrit 41, platelets 429.  Chest x-ray  showed no acute process.  He has negative cardiac enzymes x2.  EKG  showed diffuse ST abnormalities with nonspecific ST elevations.   IMPRESSION AND PLAN:  This is a [name]  presents with chest pain with signs and symptoms concerning for  pericarditis.  1. Pericarditis.  We will treat him with NSAIDs supposedly in his      chart.  He is able to take Advil.  However, he is allergic to      ibuprofen.  Will start him off with indomethacin and colchicine      including aspirin.  Depending on how he does with that, will cater      his treatment and try to obtain an echocardiogram in the morning.  2. Mallory-Weiss tear.  Will observe very carefully, and he has no      signs of current GI bleeding.  This is likely from a severe      coughing spell.  However, will consider further workup if his      hematemesis returns.      Wendi Snipes, MD  Electronically Signed     BHH/MEDQ  D:  06/26/2008  T:  06/26/2008  Job:  [id number]

## 2010-07-31 NOTE — Discharge Summary (Signed)
[name], [name]                ACCOUNT NO.:  [id number]   MEDICAL RECORD NO.:  [id number]          PATIENT TYPE:  IPS   LOCATION:  0301                          FACILITY:  [facility]   PHYSICIAN:  Geoffery Lyons, M.D.      DATE OF BIRTH:  [birth year]   DATE OF ADMISSION:  10/31/2006  DATE OF DISCHARGE:  11/02/2006                               DISCHARGE SUMMARY   CHIEF COMPLAINT AND PRESENT ILLNESS:  This was the second admission to  [facility] for this 30 year old engaged [place]  American male.  Presented to the [facility].  Endorsed that he was  depressed, hopeless, overwhelmed due to having a 34-month-old child,  financial issues and medical issues, increased anxiety, significant  weight loss, insomnia and fatigue.  The day before, he went [place] the  rail[place] with the suicidal plan to lie on the tracks.   PAST PSYCHIATRIC HISTORY:  There is no current treatment although he  endorsed he might be taking Prozac prescribed by a Dr. Steffanie Dunn.   MEDICAL HISTORY:  Status post meningitis and encephalitis with eight-  month coma in 1995.  Also has history of having had MRSA back in  February.   MEDICATIONS:  Had been taking Xanax 0.25 mg twice a day, Vicodin 5/500  mg, 2 every six hours as needed, doxycycline 100 mg twice a day and  Prozac 20 mg per day.   PHYSICAL EXAMINATION:  Performed and failed to show any acute findings.   LABORATORY DATA:  TSH 2.183.  CBC as well as blood chemistries were  within normal limits.   MENTAL STATUS EXAM:  Upon admission revealed an alert, cooperative male,  appropriately groomed and dressed.  Mood is depressed.  Affect is broad.  Thought processes are clear, rational and goal-oriented.  Endorsed that  he wanted just to get some sleep, wanting to be sure that he was  inpatient only a few days as he had an appointment the following Monday.  There was no evidence of active delusions.  No hallucinations.  Cognition well-preserved.   ADMISSION DIAGNOSES:  AXIS I:  Depressive disorder not otherwise  specified.  AXIS II:  No diagnosis.  AXIS III:  Status post MRSA infection, status post meningitis and  encephalitis in 1985.  AXIS IV:  Moderate.  AXIS V:  GAF upon admission 30; highest GAF in the last year 60.   HOSPITAL COURSE:  He was admitted.  He was started in individual and  group psychotherapy.  He was placed back on his medications.  He  endorsed that the main stress was having the newborn.  He has not been  able to sleep, increasingly more depressed, anxious.  Initially endorsed  he was feeling like hurting himself, not upon the time of this  evaluation.  He did sleep better the first night he was in the hospital.  He endorsed that he wanted to be discharged for the already-stated  reason that he had an appointment on the following a Monday.  On November 02, 2006, he continued to evidence improvement.  He endorsed that he  was  sleeping well and that he was feeling much better.  There were no active  suicidal or homicidal ideation.  No hallucinations.  No delusions.  He  was very encouraged and motivated.  He wanted to pursue medications  further and continue outpatient follow-up.  He was stable enough and  there were no concerns about his safety.  We went ahead and discharged  to outpatient follow-up.   DISCHARGE DIAGNOSES:  AXIS I:  Depressive disorder not otherwise  specified.  AXIS II:  No diagnosis.  AXIS III:  Status post MRSA infection, left __________, status post  meningitis and coma.  AXIS IV:  Moderate.  AXIS V:  GAF upon discharge 55-60.   DISCHARGE MEDICATIONS:  1. Xanax 0.25 mg twice a day as needed.  2. Doxycycline 100 mg twice a day.  3. Prozac 20 mg per day.  4. Zyprexa Zydis 5 mg at bedtime.  5. Ambien 10 mg at bedtime as needed for sleep.  6. Vicodin 5/500 mg, 1 or 2 every six hours as needed for pain.   FOLLOWUP:  [facility].      Geoffery Lyons, M.D.  Electronically  Signed     IL/MEDQ  D:  11/24/2006  T:  11/24/2006  Job:  [id number]

## 2010-07-31 NOTE — Assessment & Plan Note (Signed)
[facility]   NAME:  [name], [name]                ACCOUNT NO.:  [id number]   MEDICAL RECORD NO.:  [id number]      DATE OF BIRTH:  12/18/1980   PHYSICIAN:  Jake Shark A. Tanda Rockers, M.D. VISIT DATE:  06/30/2007                                   OFFICE VISIT   SUBJECTIVE:  [name] is a 30 year old man who we have followed for a  patellar tendonitis with osteomyelitis involving the left lower  extremity.  The patient had initially begun a course of hyperbaric  oxygen treatment that he was dismissed from that program because of  noncompliance.  In the interim, he has been seen by the wound care  physicians who have ordered an Aquacel silver dressing and an Ace wrap.  He continues to be minimally ambulatory.  He is complaining of moderate  pain, but he has had no fever.  He is accompanied by his girlfriend.   PHYSICAL EXAM:  His blood pressure is 128/71, respirations 16, pulse  rate 73, and temperature 97.8.  Inspection of the left patella shows  that there is a self-eschar over central area of ulceration at the level  of the tibial tuberosity.  This wound was sounded with a Q-tip and it  extends down to the periosteum.  There is undermining circumferentially  of approximately 2.5 cm.  There is no excessive drainage.  There is no  malodor.  There is no excessive pain.  The left knee itself has some  decreased range of motion, but there is no fluctuance.  There is no  local warmth or evidence of septic arthritis.  The foot is warm.  There  is a palpable dorsalis pedis pulse.   ASSESSMENT:  Persistent patellar tendonitis with possible osteomyelitis  involving the anterior tibial tubercle.   PLAN:  We have cultured this wound.  We are awaiting the results from  his infectious disease consult.  We will hold his antibiotics pending  return of the culture.  We have given him a limited prescription of  Percocet total of 20, 1-2 every 4-6 hours p.r.n. for severe pain.  We  will re-evaluate the patient in 2 weeks.   We have explained this approach to the patient in terms that he seems to  understand.  He indicates that he will be compliant.      Harold A. Tanda Rockers, M.D.  Electronically Signed     HAN/MEDQ  D:  06/30/2007  T:  07/01/2007  Job:  [id number]

## 2010-07-31 NOTE — Assessment & Plan Note (Signed)
[facility]   NAME:  [name]                ACCOUNT NO.:  [id number]   MEDICAL RECORD NO.:  [id number]      DATE OF BIRTH:  1981/01/24   PHYSICIAN:  Theresia Majors. Tanda Rockers, M.D. VISIT DATE:  05/14/2007                                   OFFICE VISIT   SUBJECTIVE:  [name] is a 30 year old man who we are following with a  tentative diagnosis of osteomyelitis.  He returns complaining of pain  and warmth in the left knee.  There has been no drainage.  There is some  decrease in range of motion.   OBJECTIVE:  VITALS:  Blood pressure is 137/83, respirations 20, pulse  rate 87, temperature 99.8.  Inspection of the left knee shows a residual  of the open defect in the area of the inferior pole of the patella.  The  wound is tactically warm compared to the right leg.  The Thermoscan  discloses a temperature disparity of 81.3 degrees on the right and 85.2  on the left.  There is absolutely no drainage.  The pedal pulse is  readily palpable.   ASSESSMENT:  Probable chronic osteo myelitis versus  tendinitis/periostitis.   PLAN:  The patient will continue on the Cipro and Keflex.  We have  requested an infectious disease consultation which will hopefully be  completed in the interim.  We will continue to dress the patient with a  dry gauze and a tube fish net retainer.  We will continue to proceed  with precertification for hyperbaric oxygen treatment with a diagnosis  of chronic osteomyelitis of 3 years duration.      Harold A. Tanda Rockers, M.D.  Electronically Signed     HAN/MEDQ  D:  05/14/2007  T:  05/14/2007  Job:  [id number]
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
## 2010-07-31 NOTE — Discharge Summary (Signed)
NAMEVIRGINIA, [name]                ACCOUNT NO.:  [id number]   MEDICAL RECORD NO.:  [id number]          PATIENT TYPE:  IPS   LOCATION:  0302                          [facility]ACILITY[facility]  [facility]   PHYSICIAN:  Jasmine Pang, M.D. DATE OF BIRTH:  1980-07-04   DATE OF ADMISSION:  07/28/2008  DATE OF DISCHARGE:  08/01/2008                               DISCHARGE SUMMARY   IDENTIFICATION:  This is a 30 year old single African American male who  was admitted on a voluntary basis on Jul 28, 2008.   HISTORY OF PRESENT ILLNESS:  The patient is here because he is feeling  suicidal.  He had no specific plan.  He feels his medications were not  working.  He has not been sleeping well.  His appetite has been  satisfactory and he has had some recent weight gain.  He attributes this  to his Zyprexa treatment.  He reports his stressors are primarily  financial and denies any other stressors right now.  He has been smoking  marijuana on a fairly consistent basis.  The patient has had multiple  admissions.  He was last here in March 2010, and was to follow up at the  [facility], but apparently did not do this.  For further admission  information, see psychiatric admission assessment.  The patient was  given an Axis I diagnosis of mood disorder, not otherwise specified and  also marijuana abuse.  Axis III was remarkable for chronic wound on his  left knee, which he says is resolving.   PHYSICAL FINDINGS:  There were no acute physical or medical problems  noted.  The patient was fully assessed at the [facility] ED.   LABORATORY DATA:  Alcohol level less than 5.  Urine drug screen is  positive for THC.  BMET is within normal limits.  Hemoglobin is 13.9,  hematocrit is 41.   HOSPITAL COURSE:  The patient was started on Cymbalta 30 mg p.o. daily  and Zyprexa 5 mg p.o. q.6 h. p.r.n. anxiety or agitation.  He was also  started on Ambien 5 mg p.o. q.h.s. p.r.n., may repeat x1.  In addition,  he was started on  Ultram 50 mg 1 tablet q.8 h. p.r.n. pain and the  nicotine gum as per protocol.  His Cymbalta was discontinued.  He was  started on Zyprexa 5 mg p.o. q.h.s. and Celexa 20 mg p.o. q.h.s.  The  patient tolerated his medications well with no significant side effects.  In individual sessions, the patient was casually dressed with fair eye  contact.  There was positive psychomotor retardation.  Speech was soft  and slow.  He also had an articulation disorder, which at times made it  difficult to understand him.  He was alert.  His mood was depressed and  anxious.  Affect consistent with mood.  No evidence of psychosis or  thought disorder.  On Jul 30, 2008, the patient was lying in bed, very  sleepy.  He stated he was quite depressed and anxious, but had no  suicidal ideation.  On Jul 31, 2008, the patient was  less depressed and  less anxious.  He stated he felt much better.  His sleep was good,  appetite was good.  He wanted to go home the following day.  On Aug 01, 2008, mood was euthymic.  Affect was consistent with mood.  There was  suicidal or homicidal ideation.  No thoughts of self-injurious behavior.  No auditory or visual hallucinations.  No paranoia or delusions.  Thoughts were logical and goal-directed, thought content.  No  predominant theme.  Cognitive was grossly intact.  Insight good, impulse  control was good.  The patient wanted to go home and it was felt to be  safe for discharge.   DISCHARGE DIAGNOSES:  Axis I:  Mood disorder, not otherwise specified.  Tetrahydrocannabinol abuse.  Axis II:  Features of personality disorder, not otherwise specified.  Axis III:  Chronic wound, left knee.  Axis IV:  Moderate (economic problems, burden of psychiatric illness).  Axis V:  Global assessment of functioning was 50 at discharge.  GAF was  35-40 upon admission.  GAF highest past year was 60-65.   DISCHARGE PLAN:  There was no specific activity level or dietary  restrictions.    POSTHOSPITAL CARE PLAN:  The patient will go to the [facility] for  followup treatment on August 18, 2008, at 8:30 a.m.   DISCHARGE MEDICATIONS:  1. Zyprexa 5 mg p.o. q.h.s.  2. Celexa 20 mg p.o. q.h.s.      Jasmine Pang, M.D.  Electronically Signed     BHS/MEDQ  D:  08/01/2008  T:  08/01/2008  Job:  [id number]

## 2010-08-01 ENCOUNTER — Emergency Department (HOSPITAL_COMMUNITY)
Admission: EM | Admit: 2010-08-01 | Discharge: 2010-08-01 | Disposition: A | Discharge status: Home / Self Care | Payer: Medicaid Other | Attending: Emergency Medicine | Admitting: Emergency Medicine

## 2010-08-01 DIAGNOSIS — T8189XA Other complications of procedures, not elsewhere classified, initial encounter: Secondary | ICD-10-CM | POA: Insufficient documentation

## 2010-08-01 DIAGNOSIS — M25569 Pain in unspecified knee: Secondary | ICD-10-CM | POA: Insufficient documentation

## 2010-08-01 DIAGNOSIS — Y839 Surgical procedure, unspecified as the cause of abnormal reaction of the patient, or of later complication, without mention of misadventure at the time of the procedure: Secondary | ICD-10-CM | POA: Insufficient documentation

## 2010-08-02 ENCOUNTER — Other Ambulatory Visit (HOSPITAL_BASED_OUTPATIENT_CLINIC_OR_DEPARTMENT_OTHER): Payer: Self-pay | Admitting: Internal Medicine

## 2010-08-02 ENCOUNTER — Encounter (HOSPITAL_BASED_OUTPATIENT_CLINIC_OR_DEPARTMENT_OTHER): Payer: Medicaid Other | Attending: Internal Medicine

## 2010-08-02 ENCOUNTER — Ambulatory Visit (HOSPITAL_COMMUNITY)
Admission: RE | Admit: 2010-08-02 | Discharge: 2010-08-02 | Disposition: A | Discharge status: Home / Self Care | Payer: Medicaid Other | Source: Ambulatory Visit | Attending: Internal Medicine | Admitting: Internal Medicine

## 2010-08-02 DIAGNOSIS — Z79899 Other long term (current) drug therapy: Secondary | ICD-10-CM | POA: Insufficient documentation

## 2010-08-02 DIAGNOSIS — S8990XA Unspecified injury of unspecified lower leg, initial encounter: Secondary | ICD-10-CM | POA: Insufficient documentation

## 2010-08-02 DIAGNOSIS — Y838 Other surgical procedures as the cause of abnormal reaction of the patient, or of later complication, without mention of misadventure at the time of the procedure: Secondary | ICD-10-CM | POA: Insufficient documentation

## 2010-08-02 DIAGNOSIS — M1710 Unilateral primary osteoarthritis, unspecified knee: Secondary | ICD-10-CM | POA: Insufficient documentation

## 2010-08-02 DIAGNOSIS — T8189XA Other complications of procedures, not elsewhere classified, initial encounter: Secondary | ICD-10-CM | POA: Insufficient documentation

## 2010-08-02 DIAGNOSIS — M1990 Unspecified osteoarthritis, unspecified site: Secondary | ICD-10-CM

## 2010-08-02 DIAGNOSIS — M76899 Other specified enthesopathies of unspecified lower limb, excluding foot: Secondary | ICD-10-CM | POA: Insufficient documentation

## 2010-08-02 DIAGNOSIS — L989 Disorder of the skin and subcutaneous tissue, unspecified: Secondary | ICD-10-CM | POA: Insufficient documentation

## 2010-08-02 DIAGNOSIS — X58XXXA Exposure to other specified factors, initial encounter: Secondary | ICD-10-CM | POA: Insufficient documentation

## 2010-08-03 NOTE — Op Note (Signed)
NAMEJOSEHUA, HAMMAR                ACCOUNT NO.:  [id number]   MEDICAL RECORD NO.:  [id number]          PATIENT TYPE:  INP   LOCATION:  5012                         FACILITY:  [facility]   PHYSICIAN:  Vanita Panda. Magnus Ivan, M.D.DATE OF BIRTH:  [birth year]   DATE OF PROCEDURE:  06/08/2004  DATE OF DISCHARGE:  06/10/2004                                 OPERATIVE REPORT   PREOPERATIVE DIAGNOSIS:  Left knee wound infection, status post bursectomy.   POSTOPERATIVE DIAGNOSIS:  Left knee wound infection, status post bursectomy.   OPERATION PERFORMED:  Irrigation and debridement of left knee wound  infection.   SURGEON:  Vanita Panda. Magnus Ivan, M.D.   ANESTHESIA:  General.   TOURNIQUET TIME:  25 minutes.   ESTIMATED BLOOD LOSS:  Minimal.   COMPLICATIONS:  None.   INDICATIONS FOR PROCEDURE:  Briefly, Osmany is a 30 year old individual who  has had recurrent bursitis involving his left knee.  This has been going on  for several years and he has had at least four different surgeries on his  prepatellar bursa.  This has all involved his left knee.  Three previous  surgeries were by a different surgeon.  After continued development of  bursitis in this knee, an MRI was obtained and this showed the  aforementioned bursitis.  He was brought to the operating room four weeks  ago for an additional bursectomy.  The surgery went well at that time and  his wound was closed.  He now presents with a postoperative wound infection.  He said that for about a week, he has had drainage from his knee, but has  been unable to get to the doctor due to transportation difficulties.  He  finally presented to the emergency room last night and a needle was placed  into the bursa by the emergency room staff and fluid was drained and it did  grow out gram positive cocci.  He had a high peripheral white count of  22,000 and it was recommended he undergo surgical irrigation and  debridement.   DESCRIPTION OF  PROCEDURE:  After informed consent was obtained, Xylon was  brought to the operating room and placed supine on the operating table.  General anesthesia was then obtained and a nonsterile tourniquet was placed  around the left thigh.  His left leg was then prepped and draped with  Betadine scrub and paint.  A sterile stockinette was placed around his foot  and ankle up to the shin.  The tourniquet was then inflated to 300 mmHg  pressure and the previous incision was carried down through his knee.  Abundant purulence was noted as well as drainage from this bursal site.  Cultures were sent and the patient was then given IV antibiotics with 600 mg  of IV clindamycin.  A rongeur was then used to remove necrotic debris and  then the wound itself was cleansed with pulsatile lavage using first 500 mL  of bacitracin solution followed by 3 L of normal saline solution, again all  using pulsatile lavage.  Once the wound was felt to be clean, a medium  Hemovac was placed into the wound and then the subcutaneous tissue was  closed with interrupted PDS suture.  The skin was then reapproximated with  interrupted 2-0  nylon sutures.  A well-padded sterile dressing was applied followed by  sterile Coban.  The patient was then awakened, extubated and taken to the  recovery room in stable condition.  There were no complications.  Postoperatively, he will remain on IV antibiotics pending culture results.      CYB/MEDQ  D:  06/08/2004  T:  06/10/2004  Job:  [id number]

## 2010-08-03 NOTE — Op Note (Signed)
NAME:  [name], [name]                          ACCOUNT NO.:  [id number]   MEDICAL RECORD NO.:  [id number]                   PATIENT TYPE:  AMB   LOCATION:  DSC                                  FACILITY:  [facility]   PHYSICIAN:  Nadara Mustard, M.D.                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   PROCEDURE:  Bursectomy of left knee patella tendon.  Repair left patella  tendon.   SURGEON:  Nadara Mustard, M.D.   ANESTHESIA:  LMA.   ESTIMATED BLOOD LOSS:  Minimal.   TOURNIQUET TIME:  21 minutes at 300 mmHg.   ANTIBIOTICS:  1 gram of Kefzol.   DISPOSITION:  To PACU in stable condition.   INDICATIONS FOR PROCEDURE:  The patient is a 30 year old gentleman status  post multitrauma with swelling and pain in the left knee.  The patient does  have full extension, but has pain with full extension and has weakness with  extension.  Clinically, the patient has a large bursa with degenerative  tears of the patella tendon and presents at this time for surgical  intervention after failure of conservative care.  The risks and benefits  were discussed including infection, neurovascular injury, rupture of the  patella tendon, and need for additional surgery.  The patient states he  understands and wishes to proceed at this time.   DESCRIPTION OF PROCEDURE:  The patient is brought to OR room #5 and  underwent a general LMA anesthetic.  After adequate level of anesthesia was  obtained, the patient's left lower extremity was prepped using Duraprep and  draped into a sterile field.  The leg was elevated and the tourniquet  inflated to 300 mmHg.  A longitudinal incision was made over the patella  tendon.  The  degenerative bursa tissue surrounding the patella tendon was  excised.  The patient has a large degenerative tear of the middle third of  the patella tendon and this was excised and the patella tendon was repaired  using 2-0 Fiberwire.  The wound was irrigated with normal saline. The  tourniquet was deflated after 21 minutes.  Hemostasis was obtained.  The  subcu was closed using 2-0 Vicryl.  The skin was closed using approximated  staples.  The wound was covered with Adaptic, orthopedic sponges, sterile  Webril, and a Coban dressing.  The patient was extubated and taken to PACU  in stable condition.  Plan for 23-hour observation and discharged in the  morning by nursing.  Nadara Mustard, M.D.   MVD/MEDQ  D:  01/13/2003  T:  01/13/2003  Job:  [id number]

## 2010-08-03 NOTE — Discharge Summary (Signed)
[name], [name]                ACCOUNT NO.:  [id number]   MEDICAL RECORD NO.:  [id number]          PATIENT TYPE:  INP   LOCATION:  5012                         FACILITY:  [facility]   PHYSICIAN:  Vanita Panda. Magnus Ivan, M.D.DATE OF BIRTH:  06-Jul-1980   DATE OF ADMISSION:  06/07/2004  DATE OF DISCHARGE:  06/10/2004                                 DISCHARGE SUMMARY   ADMISSION DIAGNOSIS:  Left knee prepatellar bursal wound infection.   DISCHARGE DIAGNOSIS:  Left knee prepatellar bursal wound infection.   PROCEDURE:  Irrigation and debridement left knee wound prepatellar bursal  infection on June 08, 2004.   HOSPITAL COURSE:  Briefly, [name] is a 30 year old individual who had  recurrent bursitis involving his left knee.  This had been going on for  several years and has had at least four different surgeries on his  prepatellar bursa prior to me seeing him.  This has all involved his left  knee, three previous surgeries performed by a different surgeon.  After  continued development of bursitis in this knee, an MRI was obtained and  showed the aforementioned bursitis and just fibrous tissue.  He was brought  to the operating room four weeks ago for an additional bursectomy.  The  surgery went well at that time and the wound was closed.  He now presents  with a postoperative wound infection.  He said that for about a week he had  had drainage from his knee but he had been unable to get to my office due to  transportation difficulty.  He finally presented to the emergency room on  the day prior to admission and a needle was placed in the bursa by the  emergency room staff and fluid was drained.  It did grow out gram positive  cocci.  He had a peripheral white blood cell count of 22,000 and it was  recommended he undergo surgical irrigation and debridement.  He was taken to  the operating room the morning of July 08, 2004, after being admitted that  evening and the wound was opened and  copious irrigation and debridement was  carried out.  For detailed description of the operating room, please refer  to dictated operative note and the patient's medical record.  The wound was  found to be quite clean and the wound was reapproximated with __________  placed in a well-padded sterile dressing and admitted for continued  intravenous antibiotics.   By the day of discharge, his wound was assessed and found to be clean  without any evidence of drainage.  The swelling was all completely gone and  he was tolerating a regular meal and a regular diet.  He was deemed  discharged to home in stable condition.  Follow-up appointment will be  established in my office one week after discharge.   DISCHARGE INSTRUCTIONS:  To home.  He should leave the dressing that I  placed on there intact without messing with it and keep it clean and dry.  He was discharged on oral antibiotics and pain medication.      CYB/MEDQ  D:  08/05/2004  T:  08/05/2004  Job:  [id number]

## 2010-08-03 NOTE — Discharge Summary (Signed)
NAMESADIK, PIASCIK                ACCOUNT NO.:  [id number]   MEDICAL RECORD NO.:  [id number]          PATIENT TYPE:  INP   LOCATION:  5125                         FACILITY:  [facility]   PHYSICIAN:  Vanita Panda. Magnus Ivan, M.D.DATE OF BIRTH:  04-11-1980   DATE OF ADMISSION:  05/13/2006  DATE OF DISCHARGE:  05/19/2006                               DISCHARGE SUMMARY   ADMISSION DIAGNOSIS:  Chronic left knee prepatellar bursal infection.   DISCHARGE DIAGNOSIS:  Chronic left knee prepatellar bursal infection.   PROCEDURES PERFORMED:  1. Irrigation and debridement of left knee chronic prepatellar bursal      infection on May 13, 2006.  2. Serial pulsatile lavage by PT Wound Service.   HOSPITAL COURSE:  [name] is a 30 year old male with a chronic bursal  infection involving his left knee.  He has had multiple irrigations and  debridements of this.  He was found to have a MRSA Staph infection and  had come to the office with fever, chills, and a recommended repeat  irrigation and debridement.  We have tried every different effort to get  this wound to heal and it has been quite difficult.   He was taken to the operating room on the day of admission, where this  wound was thoroughly irrigated and debrided.  He was on IV antibiotics  and the remainder of his hospital course was uncomplicated, as we let  the cultures finalize in order to make final adjustments to his  antibiotics.  During this hospital course, after the initial operation,  he had two pulsatile lavage treatments to the knee, as well.  He was  discharged to home on wet-to-dry dressing changes.   DISPOSITION:  To home.   DISCHARGE MEDICATIONS:  1. Cipro 500 mg twice a day.  2. Doxycycline 100 mg twice a day.  3. Percocet for pain.   DISCHARGE INSTRUCTIONS:  While he is at home, he will continue twice  daily soaks with wet-to-dry dressing changes of the knee, with close  followup in the office.      Vanita Panda.  Magnus Ivan, M.D.  Electronically Signed     CYB/MEDQ  D:  06/29/2006  T:  06/29/2006  Job:  [id number]

## 2010-08-03 NOTE — Op Note (Signed)
[name], [name]                ACCOUNT NO.:  [id number]   MEDICAL RECORD NO.:  [id number]          PATIENT TYPE:  OIB   LOCATION:  5021                         FACILITY:  [facility]   PHYSICIAN:  Vanita Panda. Magnus Ivan, M.D.DATE OF BIRTH:  06/23/1980   DATE OF PROCEDURE:  05/15/2004  DATE OF DISCHARGE:                                 OPERATIVE REPORT   PREOPERATIVE DIAGNOSIS:  Left knee prepatellar bursitis.   POSTOPERATIVE DIAGNOSIS:  Left knee prepatellar bursitis.   OPERATION PERFORMED:  Left knee prepatellar bursectomy.   SURGEON:  Vanita Panda. Magnus Ivan, M.D.   ANESTHESIA:  General.   COMPLICATIONS:  None.   ESTIMATED BLOOD LOSS:  Minimal.   TOURNIQUET TIME:  10 minutes.   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  He has had two previous  bursectomies by another surgeon in town, but had a vast accumulation of  fluid.  He has been drained several times in the clinic and started to  develop fibrous tissue in this area and pain.  After unsuccessful  conservative treatment, I elected to bring him to the operating room one  more time for open bursectomy and removal of scar tissue.   DESCRIPTION OF PROCEDURE:  After informed consent was obtained, [name] was  brought to the operating room and placed supine on the operating table.  The  left leg was prepped and draped from the thigh down to the ankle with  DuraPrep.  Sterile drapes including a sterile impervious stockinette.  A  tourniquet had been placed around the thigh. An Esmarch was used to wrap the  leg and the tourniquet was inflated to 300 mmHg.  Incision was made over his  knee at the previous incision and carried down to the bursa.  Bursal fluid  was encountered and was suctioned.  The wound was then cleansed with  pulsatile lavage solution.  Complete bursectomy was performed including that  of scar tissue with dissecting scissors as  well.  The patellar tendon itself  was completely intact and the joint itself was not compromised.  The wound  was then PulseEvac'd again and the deep tissue was closed with interrupted 0  Vicryl followed by 2-0 Vicryl in the subcutaneous tissue and staples on the  skin.  The tourniquet was  let down after 10 minutes of total tourniquet time.  Toes  did pinken  nicely.  A well padded sterile dressing was applied.  The patient's knee was  placed in a knee immobilizer.  He was awakened, extubated and taken to the  recovery room in stable condition.      CYB/MEDQ  D:  05/15/2004  T:  05/16/2004  Job:  [id number]

## 2010-08-03 NOTE — H&P (Signed)
NAME:  [name], [name]                          ACCOUNT NO.:  [id number]   MEDICAL RECORD NO.:  [id number]                   PATIENT TYPE:  IPS   LOCATION:  0302                                 [facility]ACILITY[facility]  [facility]   PHYSICIAN:  Geoffery Lyons, M.D.                   DATE OF BIRTH:  1980-09-01   DATE OF ADMISSION:  04/07/2002  DATE OF DISCHARGE:                         PSYCHIATRIC ADMISSION ASSESSMENT   DATE OF ASSESSMENT:  April 07, 2002   CHIEF COMPLAINT:  All my anger is inside and I can't get it out.   PATIENT IDENTIFICATION:  This is a 30 year old Hispanic male who is single,  voluntary admission.   HISTORY OF PRESENT ILLNESS:  This patient was brought to the emergency room  by way of emergency medical services after attempting to stab himself with a  knife in the abdomen.  He ended up inflicting a superficial scratch on his  left upper quadrant of his abdomen.  The patient has a history of depression  following an episode of encephalitis approximately nine years ago when he  was 30 years old.  At that time, he was treated with Zoloft from  approximately 1996 to 1997.  He went off it after a year because he felt he  no longer needed it, his depression symptoms being relieved.  He has taken  no other psychotropics since that time.  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  Although  he has these handicaps, he lives alone and functions quite well in his  apartment with his current support system.  He endorses one to three months  of increased irritability, finding that he is becoming short tempered and  irritable with neighbors and with things that were previously small  irritations.  He also endorses depressed mood with occasional spontaneous  episodes of crying.  He has recently received feedback from some of his  friends that they noticed that he is sad and crying more often.  He denies  any overt suicidal ideation today  or any homicidal ideation, denies any  hallucinations; however, when asked what he was thinking of when he tried to  stab himself, he notes that he did not know what was in his mind, he was  just all upset after having an argument with his girlfriend.  He denies any  substance abuse.   PAST PSYCHIATRIC HISTORY:  The patient has no current psychiatric treatment.  This is his first inpatient admission.  He denies any prior history of  suicidal ideation.  Although he was treated for depression once, he has no  history of other mental health treatment.   SUBSTANCE ABUSE HISTORY:  The patient has a past history of marijuana abuse,  no other substance abuse.  He reports he has had no marijuana in more than  six months and has decided he will never  use it again.   PAST MEDICAL HISTORY:  The patient is followed by Dr. Lenn Sink at [facility].  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  The  patient is also status post dental extraction of wisdom teeth on October 3.  He reports those have healed well.  He has some problems with osteoarthritis  in his left knee and left shoulder and some problems with occasional muscle  spasms.   MEDICATIONS:  1. Maxidone 10/750 mg post tooth extraction.  He no longer requires this.  2. Flexeril 10 mg p.o. t.i.d.  3. Diclofenac 75 mg b.i.d. since April 3 for his osteoarthritis in his     bilateral lower extremities.   DRUG ALLERGIES:  VIOXX ?, he is unclear but says this does not agree with  him.   PHYSICAL EXAMINATION:  GENERAL:  The patient was seen by Dr. Hughie Closs in the  [facility] Emergency Department where his physical examination was done.  It  is noted in the record and is otherwise negative.  VITAL SIGNS:  Vital signs were within normal limits at admission.   LABORATORY DATA:  His urine drug screen was negative.  CBC was within normal  limits with  normal white count, hemoglobin 15.1, hematocrit 44.2, MCV 84.4,  platelets 300,000.  Acetaminophen level was within normal limits as was his  salicylate level.  BUN 18, creatinine 0.9.  Electrolytes were normal.  Thyroid panel is currently pending.   SOCIAL HISTORY:  The patient is an unemployed Hispanic male, 30 years old,  never married, no children.  He lives alone in the [address]  His  mother helps him three times a week with errands, driving, takes him to  appointments.  He also has a nurse's aid from [employer] who visits him  from 9 a.m. to 12 noon Monday through Friday five days a week to assist him  with his instrumental activities of daily living.  He has recently broken up  with his longstanding girlfriend during an argument that occurred last  evening as part of the precipitating incidents.  He has no legal charges  against him.   FAMILY HISTORY:  Family history is unremarkable.   MENTAL STATUS EXAM:  This is a pleasant, Hispanic male who is in no acute  distress.  He is fully alert with very apraxic speech and ataxic movements.  He is in a wheelchair here on the unit for safety but does ambulate within  his home environment.  He has a bright affect.  He is calm and well focused.  His speech is normal without pressure.  It is apraxic.  It takes  considerable amount of time to speak with him.  He has some handicaps in his  mental processing and motor planning and is somewhat difficult to  understand.  His mood is depressed.  He is positive for some suicidal  ideation without any clear plan.  His thinking is clear and logical.  He  attends well.  No homicidal ideation, no evidence of psychosis.  Cognitive:  Intact.  Intelligence is average.  Insight is poor.  Judgment and impulse  control: Questionable.   ADMISSION DIAGNOSES:   AXIS I:  Major depressive disorder, recurrent, severe.   AXIS II:  Deferred.   AXIS III:  1. Left hemiparesis, partial. 2. Status  post encephalitis in 1995.  3. Motor apraxia.   AXIS IV:  Deferred.  AXIS V:  Current 38, past year 50.   INITIAL PLAN OF CARE:  Plan is to voluntarily admit the patient with q.7m.  checks in place and he is able to contract for safety.  We have elected to  start him back on Zoloft 25 mg daily, which he has tolerated well in the  past and he felt that it helped him at that time.  He is quite amenable to  this.  We will titrate this dose upward as appropriate.  We have counseled  the patient regarding this medication and since is his second episode of  depression, have advised him to stay on this indefinitely and not to go off  it without physician supervision.  He has asked some pertinent questions and  is in complete agreement with this.  We are going to ask the case manager to  check with his mother for her concerns and feel if we can establish an  adequate safety plan and he responds well, we may be able discharge him in  one to two days.  Meanwhile, it is probably going to be satisfactory for him  to follow up with his primary care physician, but we will consider that  further after we speak with his mother.   ESTIMATED LENGTH OF STAY:  Five days.     Margaret A. Scott, N.P.                   Geoffery Lyons, M.D.    MAS/MEDQ  D:  04/07/2002  T:  04/07/2002  Job:  [id number]

## 2010-08-03 NOTE — Discharge Summary (Signed)
NAME:  [name], [name]                          ACCOUNT NO.:  [id number]   MEDICAL RECORD NO.:  [id number]                   PATIENT TYPE:  INP   LOCATION:  5033                                 FACILITY:  [facility]   PHYSICIAN:  Nadara Mustard, M.D.                DATE OF BIRTH:  09-Oct-1980   DATE OF ADMISSION:  09/29/2003  DATE OF DISCHARGE:  10/03/2003                                 DISCHARGE SUMMARY   DIAGNOSIS:  Infected infrapatellar bursitis, left knee.   PROCEDURE:  Excision, infrapatellar bursa.   DISPOSITION:  Discharged to home in stable condition.   HISTORY OF PRESENT ILLNESS:  The patient is a 30 year old gentleman status  post an injury to the patella tendon for which he subsequently developed  infected infrapatellar bursitis.  The patient ha previously undergone  excision of the infrapatellar bursa and has had recurrent swelling which has  not been relieved with serial aspirations.  Cultures have been negative .  The patient presents at this time for excision of the infrapatellar bursa.   HOSPITAL COURSE:  The patient's hospital course was essentially  unremarkable.  He underwent excision of the infrapatellar bursa on September 29, 2003.  He received Kefzol for infection prophylaxis.  Cultures were obtained  x 2.  Postoperatively, the patient did well.  He Hemovac drain was removed  on postoperative day #2.  Cultures were positive for Strep Group G, and  patient was discharged on p.o. antibiotics with weightbearing as tolerated.  Plan to follow up in the office in two weeks.                                                Nadara Mustard, M.D.    MVD/MEDQ  D:  11/10/2003  T:  11/10/2003  Job:  [id number]

## 2010-08-03 NOTE — Op Note (Signed)
NAMEKINGSTON, [name]                ACCOUNT NO.:  [id number]   MEDICAL RECORD NO.:  [id number]          PATIENT TYPE:  AMB   LOCATION:  SDS                          FACILITY:  [facility]   PHYSICIAN:  Vanita Panda. Magnus Ivan, M.D.DATE OF BIRTH:  1980/11/28   DATE OF PROCEDURE:  12/10/2005  DATE OF DISCHARGE:  12/10/2005                                 OPERATIVE REPORT   PREOPERATIVE DIAGNOSIS:  Left knee chronic prepatellar bursal wound.   POSTOPERATIVE DIAGNOSIS:  Left knee chronic prepatellar bursal wound.   PROCEDURE:  Irrigation and debridement of left knee chronic wound.   SURGEON:  Vanita Panda. Magnus Ivan, M.D.   ANESTHESIA:  General.   BLOOD LOSS:  Minimal.   TOURNIQUET TIME:  30 minutes.   COMPLICATIONS:  None.   ANTIBIOTICS:  One gram of IV vancomycin.   FINDINGS:  No gross purulence.  Cultures pending.   INDICATIONS:  Briefly, [name] is a 30 year old patient well known to me.  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  He is had multiple irrigations and debridements in the past by another  orthopedic surgeon in town and then by me.  I felt that he has manipulated  his wound in the past as well, but he has had a chronic wound that has had  serous drainage that has been assessed through MRI and in white blood cell  levels, bone scan with serials x-rays and has not shown any evidence of  cortical irregularities in the bone.  I have elected to take him back to the  operating room one more time for a irrigation and debridement with closure  of the wound.  The risks and benefits of this were explained to him and well  understood.   PROCEDURE DESCRIPTION:  After informed consent was obtained and the  appropriate left leg was marked, [name] was brought to the operating room,  placed supine on the operating table.  Nonsterile tourniquet was placed  around his upper leg.  His leg was then prepped and draped with Betadine  scrub and paint  including a sterile stockinette.  Prior to making the  incision, he was identified as the correct patient and the correct  extremity.  There was a chronic opening on his prepatellar bursal area over  the patellar tendon in the fat pad.  I excised this sharply with a knife to  remove necrotic skin.  I also removed necrotic scar tissue under this.  There was no gross purulence, but I did send cultures.  I then debrided deep  tissue as well and got good bleeding base.  The wound was then irrigated  with 500 mL of bacitracin solution followed by 3 liters of normal saline  solution, all using pulsatile lavage.  I then mobilized the soft tissue and  was able to close the wound easily with Vicryl followed by 2-0 nylon suture  on the skin.  I then placed Xeroform followed by a well-padded sterile  dressing.  I then placed cylindrical fiberglass cast including stockinette,  Webril and fiberglass cast material around his knee  due to his potential for  manipulating  his wound plus I did not want him to be bending his knee or be out of any  type of knee immobilizer.  He was then awakened, extubated and taken to the  recovery room in stable condition.   I will see in the office in 1 week for followup and keep him on oral  antibiotics until then.           ______________________________  Vanita Panda. Magnus Ivan, M.D.     CYB/MEDQ  D:  12/10/2005  T:  12/12/2005  Job:  [id number]

## 2010-08-03 NOTE — Discharge Summary (Signed)
NAME:  RAYMON, SCHLARB                          ACCOUNT NO.:  [id number]   MEDICAL RECORD NO.:  [id number]                   PATIENT TYPE:  IPS   LOCATION:  0302                                 [facility]ACILITY:  [facility]   PHYSICIAN:  Geoffery Lyons, M.D.                   DATE OF BIRTH:  April 05, 1980   DATE OF ADMISSION:  04/07/2002  DATE OF DISCHARGE:  04/13/2002                                 DISCHARGE SUMMARY   CHIEF COMPLAINT AND PRESENT ILLNESS:  This was the first admission to [facility] for this 30 year old male, single, voluntarily  admitted.  He attempted to stab himself with a knife in the abdomen.  He  ended up inflicting superficial scratch on his left upper quadrant.  He had  depression following an episode of encephalitis approximately nine years ago  when he was 13.  He was treated with Zoloft from 1996 to 1997.  He went off  after a year because he felt he did not really need it.  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  He lives  alone, functions quite well.  He had short-term temper, irritable, depressed  mood, episodes of crying.  The precipitating event was having an argument  with his girlfriend.   PAST PSYCHIATRIC HISTORY:  No current psychiatric treatment.  First  inpatient admission.  He was treated for depression, as already stated.   SUBSTANCE ABUSE HISTORY:  Past history of marijuana abuse.   PAST MEDICAL HISTORY:  Left hemiparesis secondary to meningitis and  encephalitis.   MEDICATIONS:  1. Maxidone 10/750 status post tooth extraction.  2. Flexeril 10 mg three times a day.  3. Baclofen 75 mg twice a day for osteoarthritis.   PHYSICAL EXAMINATION:  Physical examination was performed, failed to show  any acute findings.   MENTAL STATUS EXAM:  Mental status exam upon admission revealed a pleasant  male in no acute distress, fully alert, very apraxic speech and ataxic  movements.  Bright affect,  calm, well focused.  Speech was normal without  pressure, considerable amount of time to speak, some handicaps.  Catalina Gravel is  depressed.  Affect: Depressed.  Some suicidal ruminations, no clear plan, no  delusions, no hallucinations.  Cognitive: Cognition was well preserved.   ADMISSION DIAGNOSES:   AXIS I:  Major depression, recurrent.   AXIS II:  No diagnosis.   AXIS III:  1. Left hemiparesis, status post encephalitis.  2. Motor apraxia.   AXIS IV:  Moderate.   AXIS V:  Global assessment of functioning upon admission 38, highest global  assessment of functioning in the last year 66.   LABORATORY DATA:  Other laboratory workup: Thyroid profile was within normal  limits.   HOSPITAL COURSE:  He was admitted and started in intensive individual and  group psychotherapy.  He was given some Ambien  for sleep and he was given  Ativan as needed.  He was maintained on the Maxidone and the Flexeril and  baclofen.  He was placed on Zoloft 25 mg per day and Zoloft  was increased  to 50 mg and then 75 mg per day.  As the hospitalization progressed, he was  able to get involved in individual and group psychotherapy.  He was able to  start expressing his feelings.  He started feeling better.  He started  dealing with the loss of the relationship, able to deal with it.  Mood was  depressed, affect was somewhat brighter but somewhat superficial, still  minimizing.  He wanted to keep the relationship with the girlfriend just as  a friend.  He continued to endorse some depression.  He was still dealing  with the loss of the relationship, some difficulties communicating his  feelings.  There was a session with his mother and uncle.  They were  supportive.  He continued to improved, get involve in therapy.  On January  27, he was in full contact with reality, mood was euthymic, affect was  bright and broad, no psychosis, no delusions, no hallucinations, no suicidal  or homicidal ideas, willing and  motivated to pursue further outpatient  treatment.   DISCHARGE DIAGNOSES:   AXIS I:  Major depression, recurrent.   AXIS II:  No diagnosis.   AXIS III:  1. Hemiparesis, status post encephalitis.  2. Motor apraxia.   AXIS IV:  Moderate.   AXIS V:  Global assessment of functioning upon discharge 55.   DISCHARGE MEDICATIONS:  1. Voltaren 75 mg one twice a day.  2. Zoloft 75 mg daily.  3. Ambien 10 mg at bedtime for sleep.  4. Flexeril 10 mg three times a day as needed for spasms.   FOLLOW UP:  He was to follow up with [facility] and Renaye Rakers.                                                 Geoffery Lyons, M.D.    IL/MEDQ  D:  05/12/2002  T:  05/13/2002  Job:  [id number]

## 2010-08-03 NOTE — Op Note (Signed)
NAMEBOLIVAR, KORANDA                ACCOUNT NO.:  [id number]   MEDICAL RECORD NO.:  [id number]          PATIENT TYPE:  INP   LOCATION:  5125                         FACILITY:  [facility]   PHYSICIAN:  Vanita Panda. Magnus Ivan, M.D.DATE OF BIRTH:  Jul 15, 1980   DATE OF PROCEDURE:  05/13/2006  DATE OF DISCHARGE:                               OPERATIVE REPORT   PREOPERATIVE DIAGNOSIS:  Left knee chronically infected prepatellar  bursa.   POSTOPERATIVE DIAGNOSIS:  Left knee chronically infected prepatellar  bursa.   PROCEDURE:  Irrigation and debridement of left knee chronically infected  prepatellar bursa.   SURGEON:  Vanita Panda. Magnus Ivan, M.D.   ANTIBIOTICS:  1 gram vancomycin after cultures obtained.   BLOOD LOSS:  Minimal.   COMPLICATIONS:  None.   INDICATIONS:  Briefly, Remmington is a 30 year old who has had at least five  or more operations on the left knee infected prepatellar bursal area.  It has grown out Enterococcus in the past. He has had chronic issues  with this being able to heal.  He has, at times, been noncompliant with  any type of the knee immobilization and we have done wet-to-dry dressing  changes and soaks and eventually he comes back with a chronic infection.  He has had MRIs and bone scans and shows this to only be in the  superficial tissues.  He starting developing a fever over the last 48  hours and drainage, again, from this wound.  His white count was 12,000  and we elected to proceed with a repeat irrigation and debridement.   PROCEDURE DESCRIPTION:  After informed consent was obtained, the  appropriate left leg was marked.  Shiro was brought to the operating  room and placed supine on the operating table.  General anesthesia was  then obtained.  His left leg was prepped and draped with Betadine scrub  and paint.  Sterile drapes were applied including a sterile stockinette.  Prior to making an incision, a time-out was called and he was identified  as the  correct patient and correct extremity. Right away there was  definitely an open wound that had gross purulence and so cultures were  obtained and sent. I then gave him the IV antibiotics.  I excised the  necrotic tissue from the prepatellar bursal area and deep and got good  bleeding tissue and this wound did not appear to track in any means  deep. I then thoroughly irrigated with 3 liters of pulsatile lavage  normal saline solution followed by 500 mL of bacitracin solution.  I was  able to then easily reapproximate the edges with interrupted 2-0 nylon  suture in a loose format.  Xeroform followed  by a well padded sterile dressing, Coban, and knee immobilizer were  placed.  The patient was awakened, extubated, and taken to the recovery  room in stable condition.  All final counts were correct.  There no  complications.  I will keep him in the hospital on IV antibiotics until  cultures can be identified with the organism for antibiotic adjustment.  ______________________________  Vanita Panda. Magnus Ivan, M.D.     CYB/MEDQ  D:  05/13/2006  T:  05/13/2006  Job:  [id number]

## 2010-08-03 NOTE — Discharge Summary (Signed)
NAMEKHADEEM, ROCKETT                ACCOUNT NO.:  [id number]   MEDICAL RECORD NO.:  [id number]          PATIENT TYPE:  INP   LOCATION:  5125                         FACILITY:  [facility]   PHYSICIAN:  Vanita Panda. Magnus Ivan, M.D.DATE OF BIRTH:  09-25-1980   DATE OF ADMISSION:  05/13/2006  DATE OF DISCHARGE:  05/19/2006                               DISCHARGE SUMMARY   ADMITTING DIAGNOSIS:  Chronic infected prepatellar bursa left knee.   DISCHARGE DIAGNOSIS:  Chronic infected prepatellar bursa left knee.   PROCEDURES:  1. Irrigation and debridement of left knee infected prepatellar bursa      on May 13, 2006.  2. Serial pulsatile lavage by PT Wound Service.   HOSPITAL COURSE:  Briefly, [name] is a 30 year old with a chronically  infected left knee prepatellar bursa.  He has undergone at least 5  previous operations to this area and has grown multiple organisms in the  past.  He presented with a continued infection in his knee.  He had a  white blood cell count of 12,000 and he just appeared sick and there was  gross purulence in the wound.  It was recommended he undergo irrigation  and debridement with admission for IV antibiotics and identifying  cultures as well as serial pulsatile lavage.  He was taken to the  operating room on the day of admission where he underwent irrigation and  debridement of the prepatellar bursal area.  Cultures were obtained and  eventually did grow out a Staph aureus infection.  There was methicillin  resistant Staphylococcus aureus.  He was on IV vancomycin as well as  Cipro for rare gram-positive rods.  Pulsatile lavage was initiated with  the PT Wound Service and he had at least 3 pulsatile lavage treatments  prior to discharge.  On the day of discharge, he was feeling much better  and his white count was back to normal.  The cultures were identified to  the point that he could be switched to oral antibiotics.   DISPOSITION:  To home.   DISCHARGE  MEDICATIONS:  1. Vancomycin for pain.  2. Doxycycline 100 mg b.i.d.  3. Cipro 500 mg b.i.d.   DISCHARGE INSTRUCTIONS:  Discharged home with continued soaks and b.i.d.  dressing changes to followup in the office in 1 week.           ______________________________  Vanita Panda. Magnus Ivan, M.D.     CYB/MEDQ  D:  05/20/2006  T:  05/21/2006  Job:  [id number]

## 2010-08-03 NOTE — Op Note (Signed)
NAME:  [name], [name]                          ACCOUNT NO.:  [id number]   MEDICAL RECORD NO.:  [id number]                   PATIENT TYPE:  INP   LOCATION:  NA                                   FACILITY:  [facility]   PHYSICIAN:  Nadara Mustard, M.D.                DATE OF BIRTH:  1981-01-25   DATE OF PROCEDURE:  09/29/2003  DATE OF DISCHARGE:                                 OPERATIVE REPORT   PREOPERATIVE DIAGNOSIS:  Infrapatellar bursitis, left knee.   POSTOPERATIVE DIAGNOSIS:  Infrapatellar bursitis, left knee.   OPERATION PERFORMED:  Excision, infrapatellar bursa.   SURGEON:  Nadara Mustard, M.D.   ANESTHESIA:  General LMA.   ESTIMATED BLOOD LOSS:  Minimal.   ANTIBIOTICS:  1g Kefzol.   TOURNIQUET TIME:  None.   CULTURES:  Times two.  Bursa sent to pathology.   DISPOSITION:  To post anesthesia care unit in stable condition.   INDICATIONS FOR PROCEDURE:  The patient is a 30 year old gentleman who is  status post repair of a patellar tendon injury and status post excision of  infrapatellar bursa.  The patient has had recurrent swelling.  He has failed  conservative care with drainage on multiple occasions as well as wicking  this open with a Penrose drain.  The patient by report has had positive  cultures and has been on Keflex p.o.  The patient presents at this time for  the above mentioned procedure.  The risks and benefits were discussed  including infection, neurovascular injury, persistent pain, recurrence of  the swelling, need for additional surgery.  The patient states he  understands and wishes to proceed at this time.   DESCRIPTION OF PROCEDURE:  The patient was brought to the operating room 16  and underwent general LMA anesthetic.  After adequate level of anesthesia  obtained, the patient's left lower extremity was prepped using DuraPrep and  draped into a sterile field.  The previous longitudinal incision was used  and this was incised down to the pseudocapsule  around the prepatellar  swelling.  This pseudocapsule was excised in total mass.  The fluid within  the capsule was clear serosanguineous fluid and cultures were obtained times  two.  The pseudocapsule was excised and sent to pathology.  The wound was  irrigated with pulse lavage.  The subcutaneous was closed using 2-0 Vicryl,  the skin was closed using Proximate staples.  A medium Hemovac was placed  deep within the wound.  The wound was covered with Adaptic orthopedic  sponges, Kerlix, Webril and a Coban dressing.  The patient was extubated and  taken to PACU in stable condition.  Hemostasis was obtained.  The wound was closed using 2-0 nylon.  The wound  was covered with Adaptic orthopedic sponges, Kerlix and a Coban dressing.  The patient was then taken to PACU in stable condition.  Nadara Mustard, M.D.    MVD/MEDQ  D:  09/29/2003  T:  09/29/2003  Job:  [id number]

## 2010-08-04 LAB — WOUND CULTURE: Gram Stain: NONE SEEN

## 2010-08-11 ENCOUNTER — Emergency Department (HOSPITAL_COMMUNITY)
Admission: EM | Admit: 2010-08-11 | Discharge: 2010-08-11 | Disposition: A | Discharge status: Home / Self Care | Payer: Medicaid Other | Attending: Emergency Medicine | Admitting: Emergency Medicine

## 2010-08-11 ENCOUNTER — Emergency Department (HOSPITAL_COMMUNITY): Payer: Medicaid Other

## 2010-08-11 DIAGNOSIS — R42 Dizziness and giddiness: Secondary | ICD-10-CM | POA: Insufficient documentation

## 2010-08-11 DIAGNOSIS — Z8661 Personal history of infections of the central nervous system: Secondary | ICD-10-CM | POA: Insufficient documentation

## 2010-08-11 DIAGNOSIS — R51 Headache: Secondary | ICD-10-CM | POA: Insufficient documentation

## 2010-09-06 ENCOUNTER — Encounter (HOSPITAL_BASED_OUTPATIENT_CLINIC_OR_DEPARTMENT_OTHER): Payer: Medicaid Other

## 2010-09-07 ENCOUNTER — Emergency Department (HOSPITAL_COMMUNITY)
Admission: EM | Admit: 2010-09-07 | Discharge: 2010-09-07 | Disposition: A | Discharge status: Home / Self Care | Payer: Medicaid Other | Attending: Emergency Medicine | Admitting: Emergency Medicine

## 2010-09-07 DIAGNOSIS — K029 Dental caries, unspecified: Secondary | ICD-10-CM | POA: Insufficient documentation

## 2010-09-07 DIAGNOSIS — Z8673 Personal history of transient ischemic attack (TIA), and cerebral infarction without residual deficits: Secondary | ICD-10-CM | POA: Insufficient documentation

## 2010-09-07 DIAGNOSIS — R51 Headache: Secondary | ICD-10-CM | POA: Insufficient documentation

## 2010-09-07 DIAGNOSIS — M62838 Other muscle spasm: Secondary | ICD-10-CM | POA: Insufficient documentation

## 2010-09-07 DIAGNOSIS — Z9889 Other specified postprocedural states: Secondary | ICD-10-CM | POA: Insufficient documentation

## 2010-09-28 ENCOUNTER — Emergency Department (HOSPITAL_COMMUNITY)
Admission: EM | Admit: 2010-09-28 | Discharge: 2010-09-29 | Disposition: A | Discharge status: Home / Self Care | Payer: Medicaid Other | Attending: Emergency Medicine | Admitting: Emergency Medicine

## 2010-09-28 DIAGNOSIS — I69998 Other sequelae following unspecified cerebrovascular disease: Secondary | ICD-10-CM | POA: Insufficient documentation

## 2010-09-28 DIAGNOSIS — R29898 Other symptoms and signs involving the musculoskeletal system: Secondary | ICD-10-CM | POA: Insufficient documentation

## 2010-09-28 DIAGNOSIS — R51 Headache: Secondary | ICD-10-CM | POA: Insufficient documentation

## 2010-09-28 DIAGNOSIS — H53149 Visual discomfort, unspecified: Secondary | ICD-10-CM | POA: Insufficient documentation

## 2010-10-26 ENCOUNTER — Emergency Department (HOSPITAL_COMMUNITY)
Admission: EM | Admit: 2010-10-26 | Discharge: 2010-10-26 | Disposition: A | Discharge status: Home / Self Care | Payer: Medicaid Other | Attending: Emergency Medicine | Admitting: Emergency Medicine

## 2010-10-26 DIAGNOSIS — Z8673 Personal history of transient ischemic attack (TIA), and cerebral infarction without residual deficits: Secondary | ICD-10-CM | POA: Insufficient documentation

## 2010-10-26 DIAGNOSIS — Z8661 Personal history of infections of the central nervous system: Secondary | ICD-10-CM | POA: Insufficient documentation

## 2010-10-26 DIAGNOSIS — R51 Headache: Secondary | ICD-10-CM | POA: Insufficient documentation

## 2010-10-26 DIAGNOSIS — L97809 Non-pressure chronic ulcer of other part of unspecified lower leg with unspecified severity: Secondary | ICD-10-CM | POA: Insufficient documentation

## 2010-10-26 DIAGNOSIS — M25569 Pain in unspecified knee: Secondary | ICD-10-CM | POA: Insufficient documentation

## 2010-12-06 LAB — BASIC METABOLIC PANEL
BUN: 15
CO2: 28
Calcium: 9.4
Creatinine, Ser: 0.69
Glucose, Bld: 105 — ABNORMAL HIGH

## 2010-12-06 LAB — CBC
MCHC: 33.6
RDW: 13.9

## 2010-12-06 LAB — DIFFERENTIAL
Basophils Absolute: 0
Basophils Relative: 0
Eosinophils Absolute: 0.2
Monocytes Absolute: 0.8
Neutro Abs: 6
Neutrophils Relative %: 61

## 2010-12-10 LAB — DIFFERENTIAL
Basophils Absolute: 0
Basophils Absolute: 0
Eosinophils Relative: 1
Lymphocytes Relative: 24
Lymphocytes Relative: 17
Lymphs Abs: 2.2
Monocytes Absolute: 0.7
Neutro Abs: 6
Neutro Abs: 9.3 — ABNORMAL HIGH

## 2010-12-10 LAB — SEDIMENTATION RATE: Sed Rate: 5

## 2010-12-10 LAB — URINALYSIS, ROUTINE W REFLEX MICROSCOPIC
Bilirubin Urine: NEGATIVE
Hgb urine dipstick: NEGATIVE
Protein, ur: 30 — AB
Urobilinogen, UA: 1

## 2010-12-10 LAB — BASIC METABOLIC PANEL
BUN: 12
Calcium: 9.2
GFR calc non Af Amer: >60
Potassium: 4.6
Sodium: 139

## 2010-12-10 LAB — POCT I-STAT CREATININE
Creatinine, Ser: 1
Operator id: 151321

## 2010-12-10 LAB — CBC
HCT: 40.6
Platelets: 462 — ABNORMAL HIGH
Platelets: 426 — ABNORMAL HIGH
RDW: 14.2
WBC: 9.2

## 2010-12-10 LAB — URINE MICROSCOPIC-ADD ON

## 2010-12-10 LAB — WOUND CULTURE

## 2010-12-10 LAB — I-STAT 8, (EC8 V) (CONVERTED LAB)
BUN: 15
Chloride: 108
HCT: 46
Hemoglobin: 15.6
Operator id: 151321
Sodium: 140

## 2010-12-11 LAB — WOUND CULTURE

## 2010-12-12 LAB — DIFFERENTIAL
Basophils Absolute: 0
Basophils Relative: 0
Eosinophils Absolute: 0.2
Eosinophils Absolute: 0.3
Eosinophils Relative: 2
Eosinophils Relative: 2
Lymphocytes Relative: 14
Lymphs Abs: 2.3
Lymphs Abs: 1.9
Monocytes Absolute: 1
Monocytes Absolute: 1.4 — ABNORMAL HIGH
Monocytes Relative: 9
Monocytes Relative: 10
Neutro Abs: 9.7 — ABNORMAL HIGH
Neutrophils Relative %: 73

## 2010-12-12 LAB — URINALYSIS, ROUTINE W REFLEX MICROSCOPIC
Glucose, UA: NEGATIVE
Glucose, UA: NEGATIVE
Hgb urine dipstick: NEGATIVE
Hgb urine dipstick: NEGATIVE
Ketones, ur: NEGATIVE
Leukocytes, UA: NEGATIVE
Nitrite: NEGATIVE
Protein, ur: NEGATIVE
Protein, ur: 100 — AB
Specific Gravity, Urine: 1.02
Urobilinogen, UA: 0.2
pH: 6.5
pH: 6.5

## 2010-12-12 LAB — COMPREHENSIVE METABOLIC PANEL
ALT: 20
ALT: 17
AST: 18
Albumin: 3.8
Alkaline Phosphatase: 83
Alkaline Phosphatase: 81
BUN: 15
CO2: 31
Calcium: 9.2
Chloride: 101
Glucose, Bld: 94
Glucose, Bld: 97
Potassium: 4.3
Sodium: 140
Total Protein: 6.8
Total Protein: 7

## 2010-12-12 LAB — CBC
HCT: 42.6
HCT: 42.9
Hemoglobin: 14
Hemoglobin: 14.4
MCHC: 33.7
MCV: 85.1
MCV: 85.5
Platelets: 417 — ABNORMAL HIGH
Platelets: 396
RBC: 5.01
RDW: 14
WBC: 10.6 — ABNORMAL HIGH
WBC: 13.2 — ABNORMAL HIGH

## 2010-12-12 LAB — COMPREHENSIVE METABOLIC PANEL WITH GFR
AST: 15
BUN: 16
CO2: 29
Calcium: 9.2
Chloride: 104
Creatinine, Ser: 0.77
GFR calc Af Amer: >60
GFR calc non Af Amer: >60
Total Bilirubin: 1

## 2010-12-12 LAB — URINE MICROSCOPIC-ADD ON

## 2010-12-12 LAB — OCCULT BLOOD X 1 CARD TO LAB, STOOL: Fecal Occult Bld: NEGATIVE

## 2010-12-12 LAB — LIPASE, BLOOD: Lipase: 15

## 2010-12-13 LAB — POCT I-STAT, CHEM 8
Hemoglobin: 15.3
Sodium: 139
TCO2: 27

## 2010-12-13 LAB — CBC
HCT: 41.8
MCHC: 33.6
MCV: 84
Platelets: 515 — ABNORMAL HIGH
RDW: 13.5

## 2010-12-13 LAB — DIFFERENTIAL
Basophils Absolute: 0
Eosinophils Absolute: 0.2
Eosinophils Relative: 3

## 2010-12-14 LAB — POCT I-STAT, CHEM 8
BUN: 20
Calcium, Ion: 1.23
Chloride: 103
Creatinine, Ser: 1
TCO2: 30

## 2010-12-17 LAB — DIFFERENTIAL
Basophils Relative: 0
Eosinophils Absolute: 0.2
Lymphs Abs: 1.8
Monocytes Relative: 9
Neutro Abs: 6.5
Neutrophils Relative %: 69

## 2010-12-17 LAB — POCT I-STAT, CHEM 8
Chloride: 103
HCT: 43
Hemoglobin: 14.6
Potassium: 4
Sodium: 139

## 2010-12-17 LAB — CBC
MCHC: 33.6
MCV: 84.7
Platelets: 431 — ABNORMAL HIGH
RBC: 4.83
WBC: 9.4

## 2010-12-17 LAB — RAPID URINE DRUG SCREEN, HOSP PERFORMED
Amphetamines: NOT DETECTED
Barbiturates: NOT DETECTED
Benzodiazepines: NOT DETECTED
Tetrahydrocannabinol: POSITIVE — AB

## 2010-12-18 LAB — URINALYSIS, ROUTINE W REFLEX MICROSCOPIC
Glucose, UA: NEGATIVE
Ketones, ur: NEGATIVE
Protein, ur: NEGATIVE
Urobilinogen, UA: 1

## 2010-12-18 LAB — CBC
HCT: 40.2
MCHC: 33
MCV: 86.1
Platelets: 399
RBC: 5.01
RDW: 14.5
WBC: 9.9

## 2010-12-18 LAB — COMPREHENSIVE METABOLIC PANEL
Albumin: 3.5
BUN: 15
CO2: 30
Chloride: 102
Creatinine, Ser: 0.78
GFR calc non Af Amer: >60
Total Bilirubin: 0.5

## 2010-12-18 LAB — DIFFERENTIAL
Basophils Relative: 0
Eosinophils Absolute: 0.3
Eosinophils Relative: 3
Lymphocytes Relative: 12
Lymphs Abs: 2.5
Lymphs Abs: 1.2
Monocytes Relative: 11
Neutro Abs: 7.5
Neutrophils Relative %: 76

## 2010-12-18 LAB — ETHANOL: Alcohol, Ethyl (B): <5

## 2010-12-18 LAB — RAPID URINE DRUG SCREEN, HOSP PERFORMED
Amphetamines: NOT DETECTED
Benzodiazepines: NOT DETECTED
Cocaine: NOT DETECTED
Cocaine: NOT DETECTED
Opiates: NOT DETECTED
Opiates: NOT DETECTED
Tetrahydrocannabinol: NOT DETECTED

## 2010-12-18 LAB — BASIC METABOLIC PANEL
BUN: 13
CO2: 28
Calcium: 9.6
Chloride: 104
Creatinine, Ser: 0.72
Creatinine, Ser: 0.79
GFR calc Af Amer: >60
Glucose, Bld: 119 — ABNORMAL HIGH
Potassium: 3.9

## 2010-12-21 LAB — URINALYSIS, ROUTINE W REFLEX MICROSCOPIC
Ketones, ur: 15 — AB
Nitrite: NEGATIVE
pH: 6

## 2010-12-21 LAB — POCT I-STAT, CHEM 8
Calcium, Ion: 1.14 mmol/L (ref 1.12–1.32)
Chloride: 101 mEq/L (ref 96–112)
Glucose, Bld: 88 mg/dL (ref 70–99)
HCT: 44 % (ref 39.0–52.0)
Hemoglobin: 15 g/dL (ref 13.0–17.0)

## 2010-12-21 LAB — CBC
MCV: 85.7
RBC: 4.25
WBC: 6.2

## 2010-12-21 LAB — URINE MICROSCOPIC-ADD ON

## 2010-12-24 LAB — URINALYSIS, ROUTINE W REFLEX MICROSCOPIC
Glucose, UA: NEGATIVE
Glucose, UA: NEGATIVE
Hgb urine dipstick: NEGATIVE
Ketones, ur: 15 — AB
Protein, ur: NEGATIVE
Specific Gravity, Urine: 1.031 — ABNORMAL HIGH
Urobilinogen, UA: 1
pH: 6.5

## 2010-12-24 LAB — CBC
HCT: 36.8 — ABNORMAL LOW
HCT: 41.1
Hemoglobin: 12.4 — ABNORMAL LOW
Hemoglobin: 14.3
MCV: 84.8
Platelets: 376
WBC: 8.2
WBC: 8.9

## 2010-12-24 LAB — DIFFERENTIAL
Basophils Absolute: 0
Basophils Relative: 0
Eosinophils Relative: 2
Lymphocytes Relative: 21
Lymphocytes Relative: 33
Lymphs Abs: 1.7
Monocytes Absolute: 0.6
Monocytes Absolute: 0.8
Neutro Abs: 5
Neutro Abs: 5.7
Neutrophils Relative %: 56

## 2010-12-24 LAB — RAPID URINE DRUG SCREEN, HOSP PERFORMED
Amphetamines: NOT DETECTED
Barbiturates: NOT DETECTED
Benzodiazepines: NOT DETECTED
Cocaine: NOT DETECTED

## 2010-12-24 LAB — COMPREHENSIVE METABOLIC PANEL
ALT: 17
AST: 17
Albumin: 3.7
Alkaline Phosphatase: 74
BUN: 20
CO2: 29
Calcium: 9
Chloride: 105
Creatinine, Ser: 0.75
GFR calc Af Amer: >60
GFR calc non Af Amer: >60
Glucose, Bld: 96
Potassium: 4.1
Sodium: 139
Total Bilirubin: 0.3
Total Protein: 6.6

## 2010-12-24 LAB — BASIC METABOLIC PANEL
Calcium: 9.3
GFR calc Af Amer: >60
GFR calc non Af Amer: >60
Potassium: 4
Sodium: 138

## 2010-12-24 LAB — ETHANOL: Alcohol, Ethyl (B): <5

## 2010-12-25 LAB — BASIC METABOLIC PANEL
BUN: 19
Calcium: 9
Chloride: 106
GFR calc Af Amer: >60
GFR calc non Af Amer: >60
GFR calc non Af Amer: >60
Glucose, Bld: 115 — ABNORMAL HIGH
Potassium: 3.9
Sodium: 141
Sodium: 142

## 2010-12-25 LAB — RAPID URINE DRUG SCREEN, HOSP PERFORMED
Amphetamines: NOT DETECTED
Barbiturates: NOT DETECTED
Benzodiazepines: NOT DETECTED
Cocaine: NOT DETECTED
Opiates: NOT DETECTED
Tetrahydrocannabinol: NOT DETECTED

## 2010-12-25 LAB — I-STAT 8, (EC8 V) (CONVERTED LAB)
Acid-Base Excess: 2
Acid-Base Excess: 3 — ABNORMAL HIGH
BUN: 11
BUN: 15
Bicarbonate: 25.9 — ABNORMAL HIGH
Bicarbonate: 29.6 — ABNORMAL HIGH
Chloride: 104
Glucose, Bld: 74
HCT: 43
HCT: 43
Operator id: 282201
Operator id: 151321
pCO2, Ven: 37.2 — ABNORMAL LOW
pCO2, Ven: 47.3
pCO2, Ven: 53.4 — ABNORMAL HIGH
pH, Ven: 7.378 — ABNORMAL HIGH

## 2010-12-25 LAB — DIFFERENTIAL
Basophils Absolute: 0
Basophils Relative: 0
Eosinophils Relative: 3
Lymphocytes Relative: 22
Lymphs Abs: 1.9
Monocytes Relative: 9
Monocytes Relative: 10
Neutro Abs: 5
Neutrophils Relative %: 55

## 2010-12-25 LAB — URINALYSIS, ROUTINE W REFLEX MICROSCOPIC
Glucose, UA: NEGATIVE
Hgb urine dipstick: NEGATIVE
Ketones, ur: NEGATIVE
pH: 6

## 2010-12-25 LAB — CBC
HCT: 38.8 — ABNORMAL LOW
MCHC: 33.5
Platelets: 397
RBC: 4.59
RBC: 4.76
RDW: 14.1 — ABNORMAL HIGH
WBC: 8.7

## 2010-12-25 LAB — PROTIME-INR
INR: 1.1
Prothrombin Time: 14.2

## 2010-12-25 LAB — POCT I-STAT CREATININE
Creatinine, Ser: 0.8
Creatinine, Ser: 0.9
Operator id: 257131
Operator id: 282201

## 2010-12-26 LAB — POCT CARDIAC MARKERS
CKMB, poc: 1.2
Troponin i, poc: <0.05

## 2010-12-26 LAB — COMPREHENSIVE METABOLIC PANEL
AST: 17
Alkaline Phosphatase: 78
BUN: 18
CO2: 33 — ABNORMAL HIGH
Chloride: 103
Creatinine, Ser: 0.8
GFR calc Af Amer: >60
GFR calc non Af Amer: >60
Potassium: 4.1
Total Bilirubin: 0.8

## 2010-12-26 LAB — WOUND CULTURE

## 2010-12-26 LAB — DIFFERENTIAL
Basophils Absolute: 0
Basophils Absolute: 0
Basophils Absolute: 0
Basophils Relative: 0
Basophils Relative: 1
Eosinophils Relative: 2
Eosinophils Relative: 4
Lymphocytes Relative: 21
Lymphocytes Relative: 31
Lymphocytes Relative: 33
Lymphs Abs: 2.2
Monocytes Absolute: 0.6
Neutro Abs: 4.7
Neutro Abs: 7.2
Neutrophils Relative %: 54

## 2010-12-26 LAB — URINE CULTURE: Colony Count: 30000

## 2010-12-26 LAB — RAPID URINE DRUG SCREEN, HOSP PERFORMED
Amphetamines: NOT DETECTED
Barbiturates: NOT DETECTED
Opiates: POSITIVE — AB

## 2010-12-26 LAB — I-STAT 8, (EC8 V) (CONVERTED LAB)
BUN: 20
Bicarbonate: 25.5 — ABNORMAL HIGH
Glucose, Bld: 87
Glucose, Bld: 92
HCT: 46
Hemoglobin: 15.6
Operator id: 272551
Potassium: 3.8
Potassium: 4.1
Sodium: 139
TCO2: 27
TCO2: 32
pCO2, Ven: 55.4 — ABNORMAL HIGH
pH, Ven: 7.351 — ABNORMAL HIGH

## 2010-12-26 LAB — URINALYSIS, ROUTINE W REFLEX MICROSCOPIC
Bilirubin Urine: NEGATIVE
Glucose, UA: NEGATIVE
Hgb urine dipstick: NEGATIVE
Nitrite: NEGATIVE
Specific Gravity, Urine: 1.029
Specific Gravity, Urine: 1.03
Urobilinogen, UA: 1
Urobilinogen, UA: 1
pH: 6

## 2010-12-26 LAB — CULTURE, BLOOD (ROUTINE X 2): Culture: NO GROWTH

## 2010-12-26 LAB — OCCULT BLOOD X 1 CARD TO LAB, STOOL: Fecal Occult Bld: NEGATIVE

## 2010-12-26 LAB — CBC
HCT: 42.1
HCT: 42.3
Hemoglobin: 14.1
MCHC: 33.4
MCV: 86.2
RBC: 4.89
RDW: 13.8
RDW: 14.3 — ABNORMAL HIGH
WBC: 10.2
WBC: 9.9

## 2010-12-26 LAB — URINE MICROSCOPIC-ADD ON

## 2010-12-26 LAB — POCT I-STAT CREATININE
Operator id: 265201
Operator id: 272551

## 2010-12-26 LAB — SALICYLATE LEVEL: Salicylate Lvl: <4

## 2010-12-27 LAB — I-STAT 8, (EC8 V) (CONVERTED LAB)
BUN: 19
BUN: 12
Bicarbonate: 23.2
Bicarbonate: 25.2 — ABNORMAL HIGH
Bicarbonate: 26.2 — ABNORMAL HIGH
Chloride: 103
Glucose, Bld: 153 — ABNORMAL HIGH
Glucose, Bld: 89
Glucose, Bld: 89
HCT: 47
HCT: 40
Hemoglobin: 16
Hemoglobin: 12.2 — ABNORMAL LOW
Hemoglobin: 13.6
Hemoglobin: 15
Operator id: 198171
Operator id: 196461
Potassium: 4.1
Sodium: 140
Sodium: 137
Sodium: 140
Sodium: 141
TCO2: 24
TCO2: 27
TCO2: 27
pCO2, Ven: 38.8 — ABNORMAL LOW
pCO2, Ven: 46.8
pH, Ven: 7.53 — ABNORMAL HIGH

## 2010-12-27 LAB — DIFFERENTIAL
Basophils Absolute: 0
Basophils Absolute: 0
Basophils Absolute: 0
Basophils Relative: 0
Basophils Relative: 0
Basophils Relative: 0
Basophils Relative: 0
Eosinophils Absolute: 0.1
Eosinophils Relative: 1
Eosinophils Relative: 2
Lymphocytes Relative: 23
Lymphocytes Relative: 33
Lymphocytes Relative: 26
Lymphocytes Relative: 11 — ABNORMAL LOW
Lymphocytes Relative: 29
Lymphs Abs: 2.7
Lymphs Abs: 1.8
Lymphs Abs: 0.9
Monocytes Absolute: 0.8 — ABNORMAL HIGH
Monocytes Absolute: 0.6
Monocytes Relative: 6
Monocytes Relative: 8
Neutro Abs: 4.3
Neutro Abs: 4.5
Neutro Abs: 4.6
Neutro Abs: 6.3
Neutrophils Relative %: 64
Neutrophils Relative %: 60
Neutrophils Relative %: 84 — ABNORMAL HIGH

## 2010-12-27 LAB — CSF CULTURE W GRAM STAIN: Gram Stain: NONE SEEN

## 2010-12-27 LAB — BASIC METABOLIC PANEL
BUN: 17
BUN: 15
CO2: 32
CO2: 26
Calcium: 9.3
Chloride: 106
Creatinine, Ser: 0.69
GFR calc non Af Amer: >60
GFR calc non Af Amer: >60
Glucose, Bld: 90
Glucose, Bld: 76
Potassium: 3.7
Sodium: 139
Sodium: 137

## 2010-12-27 LAB — CBC
HCT: 42.3
HCT: 34.6 — ABNORMAL LOW
HCT: 36.6 — ABNORMAL LOW
Hemoglobin: 12.3 — ABNORMAL LOW
Hemoglobin: 12.9 — ABNORMAL LOW
Hemoglobin: 12.1 — ABNORMAL LOW
MCHC: 34
MCHC: 33.2
MCHC: 34
MCV: 86.1
Platelets: 352
Platelets: 362
Platelets: 378
Platelets: 397
RBC: 4.88
RBC: 4.24
RBC: 4.65
RDW: 14.1 — ABNORMAL HIGH
RDW: 14.4 — ABNORMAL HIGH
RDW: 14
WBC: 8.3
WBC: 7.1
WBC: 7.9

## 2010-12-27 LAB — POCT I-STAT CREATININE
Operator id: 198171
Operator id: 196461
Operator id: 270651

## 2010-12-27 LAB — URINALYSIS, ROUTINE W REFLEX MICROSCOPIC
Bilirubin Urine: NEGATIVE
Hgb urine dipstick: NEGATIVE
Protein, ur: NEGATIVE
Specific Gravity, Urine: 1.021
Urobilinogen, UA: 1

## 2010-12-27 LAB — CSF CELL COUNT WITH DIFFERENTIAL
RBC Count, CSF: 0
RBC Count, CSF: 3 — ABNORMAL HIGH
Tube #: 1
WBC, CSF: 1

## 2010-12-27 LAB — CULTURE, BLOOD (ROUTINE X 2)
Culture: NO GROWTH
Culture: NO GROWTH

## 2010-12-27 LAB — POCT CARDIAC MARKERS
CKMB, poc: <1 — ABNORMAL LOW
Myoglobin, poc: 56.6
Operator id: 198171
Troponin i, poc: <0.05

## 2010-12-27 LAB — GRAM STAIN

## 2010-12-28 LAB — CULTURE, BLOOD (ROUTINE X 2)
Culture: NO GROWTH
Culture: NO GROWTH

## 2010-12-28 LAB — DIFFERENTIAL
Basophils Relative: 1
Eosinophils Absolute: 0.2
Eosinophils Absolute: 0.3
Eosinophils Relative: 1
Eosinophils Relative: 3
Eosinophils Relative: 3
Lymphocytes Relative: 25
Lymphocytes Relative: 39
Lymphocytes Relative: 9 — ABNORMAL LOW
Lymphs Abs: 0.9
Lymphs Abs: 2.2
Lymphs Abs: 2.7
Lymphs Abs: 3.2
Monocytes Absolute: 0.7
Monocytes Absolute: 0.8 — ABNORMAL HIGH
Monocytes Absolute: 1 — ABNORMAL HIGH
Monocytes Relative: 10
Monocytes Relative: 9
Neutro Abs: 4.2
Neutro Abs: 5.5

## 2010-12-28 LAB — COMPREHENSIVE METABOLIC PANEL
ALT: 19
ALT: 20
AST: 22
AST: 19
Albumin: 3.5
Albumin: 3.5
Alkaline Phosphatase: 65
CO2: 29
CO2: 28
Calcium: 9.2
Calcium: 9.2
Calcium: 9
Creatinine, Ser: 0.83
Creatinine, Ser: 0.79
GFR calc Af Amer: >60
GFR calc Af Amer: >60
GFR calc Af Amer: >60
GFR calc non Af Amer: >60
GFR calc non Af Amer: >60
Glucose, Bld: 91
Potassium: 3.4 — ABNORMAL LOW
Sodium: 141
Sodium: 141
Sodium: 135
Total Protein: 6.8
Total Protein: 6.8
Total Protein: 6.8

## 2010-12-28 LAB — CBC
HCT: 35.5 — ABNORMAL LOW
HCT: 38.3 — ABNORMAL LOW
HCT: 39.1
Hemoglobin: 12.1 — ABNORMAL LOW
Hemoglobin: 12.8 — ABNORMAL LOW
MCHC: 33.3
MCHC: 33.8
MCHC: 34
MCV: 85.8
MCV: 85.9
MCV: 85.3
Platelets: 344
Platelets: 403 — ABNORMAL HIGH
Platelets: 498 — ABNORMAL HIGH
Platelets: 523 — ABNORMAL HIGH
RBC: 4.14 — ABNORMAL LOW
RBC: 4.33
RBC: 4.61
RDW: 13.1
RDW: 12.6
WBC: 7.9
WBC: 8.9
WBC: 9.2

## 2010-12-28 LAB — I-STAT 8, (EC8 V) (CONVERTED LAB)
BUN: 17
Chloride: 105
HCT: 44
Hemoglobin: 15
Operator id: 272551
Potassium: 4.3
Sodium: 140
pCO2, Ven: 48.7

## 2010-12-28 LAB — POCT I-STAT CREATININE
Creatinine, Ser: 0.8
Operator id: 272551

## 2010-12-28 LAB — BASIC METABOLIC PANEL
Chloride: 107
GFR calc Af Amer: >60
Potassium: 3.9

## 2010-12-28 LAB — TSH: TSH: 2.183

## 2010-12-28 LAB — WOUND CULTURE

## 2010-12-28 LAB — POCT CARDIAC MARKERS
CKMB, poc: 1.9
Troponin i, poc: <0.05

## 2010-12-28 LAB — D-DIMER, QUANTITATIVE: D-Dimer, Quant: 0.35

## 2010-12-31 LAB — I-STAT 8, (EC8 V) (CONVERTED LAB)
Acid-Base Excess: 4 — ABNORMAL HIGH
Chloride: 104
Glucose, Bld: 94
HCT: 43
Operator id: 277751
Potassium: 3.9
Potassium: 4
Sodium: 141
TCO2: 31
pCO2, Ven: 43.5 — ABNORMAL LOW
pH, Ven: 7.4 — ABNORMAL HIGH
pH, Ven: 7.427 — ABNORMAL HIGH

## 2010-12-31 LAB — DIFFERENTIAL
Basophils Relative: 1
Eosinophils Relative: 1
Lymphocytes Relative: 19
Lymphocytes Relative: 31
Lymphs Abs: 2.2
Monocytes Absolute: 0.9 — ABNORMAL HIGH
Monocytes Absolute: 1.2 — ABNORMAL HIGH
Monocytes Relative: 12 — ABNORMAL HIGH
Monocytes Relative: 12 — ABNORMAL HIGH
Neutro Abs: 3.6
Neutro Abs: 7
Neutrophils Relative %: 52

## 2010-12-31 LAB — POCT I-STAT CREATININE
Creatinine, Ser: 0.8
Creatinine, Ser: 0.8
Operator id: 277751
Operator id: 282201

## 2010-12-31 LAB — CBC
HCT: 40
Hemoglobin: 13.6
Hemoglobin: 13.6
RBC: 4.66
RBC: 4.68
WBC: 7

## 2011-01-24 ENCOUNTER — Emergency Department (HOSPITAL_COMMUNITY)
Admission: EM | Admit: 2011-01-24 | Discharge: 2011-01-24 | Disposition: A | Discharge status: Home / Self Care | Payer: Medicaid Other | Attending: Emergency Medicine | Admitting: Emergency Medicine

## 2011-01-24 ENCOUNTER — Encounter: Payer: Self-pay | Admitting: Emergency Medicine

## 2011-01-24 DIAGNOSIS — A084 Viral intestinal infection, unspecified: Secondary | ICD-10-CM

## 2011-01-24 DIAGNOSIS — A088 Other specified intestinal infections: Secondary | ICD-10-CM | POA: Insufficient documentation

## 2011-01-24 DIAGNOSIS — R10819 Abdominal tenderness, unspecified site: Secondary | ICD-10-CM | POA: Insufficient documentation

## 2011-01-24 HISTORY — DX: Encephalitis and encephalomyelitis, unspecified: G04.90

## 2011-01-24 HISTORY — DX: Meningitis, unspecified: G03.9

## 2011-01-24 LAB — URINALYSIS, ROUTINE W REFLEX MICROSCOPIC
Nitrite: NEGATIVE
Specific Gravity, Urine: 1.029 (ref 1.005–1.030)
pH: 5.5 (ref 5.0–8.0)

## 2011-01-24 LAB — COMPREHENSIVE METABOLIC PANEL
Albumin: 3.7 g/dL (ref 3.5–5.2)
BUN: 22 mg/dL (ref 6–23)
Creatinine, Ser: 0.77 mg/dL (ref 0.50–1.35)
Total Protein: 7.1 g/dL (ref 6.0–8.3)

## 2011-01-24 LAB — DIFFERENTIAL
Basophils Absolute: 0 10*3/uL (ref 0.0–0.1)
Eosinophils Absolute: 0.2 10*3/uL (ref 0.0–0.7)
Lymphocytes Relative: 10 % — ABNORMAL LOW (ref 12–46)
Lymphs Abs: 1 10*3/uL (ref 0.7–4.0)
Neutro Abs: 7.4 10*3/uL (ref 1.7–7.7)

## 2011-01-24 LAB — CBC
HCT: 41.7 % (ref 39.0–52.0)
MCHC: 34.8 g/dL (ref 30.0–36.0)
Platelets: 335 10*3/uL (ref 150–400)
RDW: 13.4 % (ref 11.5–15.5)

## 2011-01-24 LAB — LIPASE, BLOOD: Lipase: 16 U/L (ref 11–59)

## 2011-01-24 LAB — URINE MICROSCOPIC-ADD ON

## 2011-01-24 MED ORDER — ONDANSETRON HCL 4 MG/2ML IJ SOLN
4.0000 mg | Freq: Once | INTRAMUSCULAR | Status: AC
  Administered 2011-01-24: 4 mg via INTRAVENOUS
  Filled 2011-01-24: qty 2

## 2011-01-24 MED ORDER — FAMOTIDINE IN NACL 20-0.9 MG/50ML-% IV SOLN
20.0000 mg | Freq: Once | INTRAVENOUS | Status: AC
  Administered 2011-01-24: 20 mg via INTRAVENOUS
  Filled 2011-01-24: qty 50

## 2011-01-24 MED ORDER — MORPHINE SULFATE 4 MG/ML IJ SOLN
4.0000 mg | Freq: Once | INTRAMUSCULAR | Status: AC
  Administered 2011-01-24: 4 mg via INTRAVENOUS
  Filled 2011-01-24: qty 1

## 2011-01-24 MED ORDER — ONDANSETRON HCL 8 MG PO TABS: 8.0000 mg | ORAL_TABLET | Freq: Three times a day (TID) | ORAL | Status: AC | PRN

## 2011-01-24 MED ORDER — FAMOTIDINE 20 MG PO TABS: 20.0000 mg | ORAL_TABLET | Freq: Two times a day (BID) | ORAL | Status: DC | PRN

## 2011-01-24 MED ORDER — SODIUM CHLORIDE 0.9 % IV SOLN: INTRAVENOUS | Status: DC

## 2011-01-24 MED ORDER — SODIUM CHLORIDE 0.9 % IV BOLUS (SEPSIS)
1000.0000 mL | Freq: Once | INTRAVENOUS | Status: AC
  Administered 2011-01-24: 1000 mL via INTRAVENOUS

## 2011-01-24 NOTE — ED Provider Notes (Signed)
History     CSN: [id number] Arrival date & time: 01/24/2011  8:34 PM   First MD Initiated Contact with Patient 01/24/11 2051      Chief Complaint  Patient presents with  . Emesis  . Diarrhea    (Consider location/radiation/quality/duration/timing/severity/associated sxs/prior treatment) HPI Comments: The patient is a 30 year old male who presents to the emergency department for evaluation of nausea, vomiting, and diarrhea. The patient reports that symptoms started this morning at approximately 4 AM when he began vomiting. This was shortly after having eaten attack or a salad. He reports mild epigastric discomfort but no other abdominal pain. He reports watery brown diarrhea, multiple episodes, without blood present in his stool. Prior to his evaluation based on his symptoms that he presented with, I had ordered him an antacid, anti-medic, analgesic, and IV fluids. At the time of evaluation the patient states "I feel so much better, I feel ready to go home."  Patient is a 30 y.o. male presenting with vomiting and diarrhea. The history is provided by the patient.  Emesis  This is a new problem. The current episode started 12 to 24 hours ago. The problem occurs 5 to 10 times per day. The problem has been resolved. The emesis has an appearance of stomach contents. There has been no fever. Associated symptoms include diarrhea. Pertinent negatives include no abdominal pain, no arthralgias, no chills, no cough, no fever, no headaches, no myalgias, no sweats and no URI. Risk factors include suspect food intake.  Diarrhea The primary symptoms include nausea, vomiting and diarrhea. Primary symptoms do not include fever, weight loss, fatigue, abdominal pain, melena, hematemesis, jaundice, hematochezia, dysuria, myalgias, arthralgias or rash. The illness began today. The onset was sudden. The problem has been gradually improving.  The illness does not include chills, anorexia, dysphagia, bloating,  constipation, back pain or itching.    Past Medical History  Diagnosis Date  . Meningitis   . Encephalitis     No past surgical history on file.  No family history on file.  History  Substance Use Topics  . Smoking status: Not on file  . Smokeless tobacco: Not on file  . Alcohol Use:       Review of Systems  Unable to perform ROS Constitutional: Positive for appetite change. Negative for fever, chills, weight loss, diaphoresis, activity change, fatigue and unexpected weight change.  HENT: Negative for ear pain, congestion, sore throat, rhinorrhea, mouth sores, trouble swallowing, neck pain, neck stiffness and postnasal drip.   Eyes: Negative.   Respiratory: Negative for cough, chest tightness, shortness of breath and wheezing.   Cardiovascular: Negative for chest pain, palpitations and leg swelling.  Gastrointestinal: Positive for nausea, vomiting and diarrhea. Negative for dysphagia, abdominal pain, constipation, blood in stool, melena, hematochezia, abdominal distention, anal bleeding, rectal pain, bloating, anorexia, hematemesis and jaundice.  Genitourinary: Negative for dysuria, urgency, frequency, hematuria and flank pain.  Musculoskeletal: Negative for myalgias, back pain and arthralgias.  Skin: Negative for color change, itching, pallor, rash and wound.  Neurological: Negative for dizziness, syncope, weakness, light-headedness and headaches.  Hematological: Negative for adenopathy.  Psychiatric/Behavioral: Negative.     Allergies  Ciprofloxacin; Hydrocodone-acetaminophen; Ibuprofen; Propoxyphene n-acetaminophen; and Tramadol hcl  Home Medications   Current Outpatient Rx  Name Route Sig Dispense Refill  . THERA M PLUS PO TABS Oral Take 1 tablet by mouth daily.      . OXYCODONE-ACETAMINOPHEN 5-325 MG PO TABS Oral Take 1 tablet by mouth 2 (two) times daily as needed. For  pain.       BP 117/71  Pulse 98  Temp(Src) 98.3 F (36.8 C) (Oral)  Resp 20  SpO2  99%  Physical Exam  Nursing note and vitals reviewed. Constitutional: He is oriented to person, place, and time. He appears well-developed and well-nourished. He is active.  Non-toxic appearance. He does not have a sickly appearance. He does not appear ill. No distress.  HENT:  Head: Normocephalic and atraumatic.  Right Ear: Hearing, tympanic membrane, external ear and ear canal normal.  Left Ear: Hearing, tympanic membrane, external ear and ear canal normal.  Nose: Nose normal. No mucosal edema.  Mouth/Throat: Uvula is midline and oropharynx is clear and moist. Mucous membranes are dry. No oral lesions. No uvula swelling. No oropharyngeal exudate, posterior oropharyngeal edema, posterior oropharyngeal erythema or tonsillar abscesses.  Eyes: Conjunctivae and EOM are normal. Pupils are equal, round, and reactive to light. Right eye exhibits no chemosis, no discharge and no exudate. Left eye exhibits no chemosis, no discharge and no exudate. Right conjunctiva is not injected. Left conjunctiva is not injected. No scleral icterus.  Neck: Normal range of motion, full passive range of motion without pain and phonation normal. Neck supple. No rigidity. No Brudzinski's sign noted.  Cardiovascular: Normal rate, regular rhythm, intact distal pulses and normal pulses.   No extrasystoles are present.  Pulmonary/Chest: Effort normal and breath sounds normal. No accessory muscle usage. Not tachypneic. No respiratory distress. He has no decreased breath sounds. He has no wheezes. He has no rhonchi. He has no rales. He exhibits no tenderness, no crepitus and no retraction.  Abdominal: Soft. Normal appearance. He exhibits no shifting dullness, no distension, no pulsatile liver, no fluid wave, no abdominal bruit, no ascites, no pulsatile midline mass and no mass. Bowel sounds are increased. There is hepatosplenomegaly. There is generalized tenderness. There is no rigidity, no rebound and no guarding. No hernia.   Musculoskeletal: Normal range of motion.  Neurological: He is alert and oriented to person, place, and time. He has normal strength and normal reflexes. He is not disoriented. No cranial nerve deficit. Coordination normal. GCS eye subscore is 4. GCS verbal subscore is 5. GCS motor subscore is 6.  Skin: Skin is warm, dry and intact. No bruising, no ecchymosis, no lesion and no rash noted. He is not diaphoretic. No erythema. No pallor.  Psychiatric: He has a normal mood and affect. His speech is normal and behavior is normal. Judgment and thought content normal. Cognition and memory are normal.    ED Course  Procedures (including critical care time)  Labs Reviewed  DIFFERENTIAL - Abnormal; Notable for the following:    Lymphocytes Relative 10 (*)    Monocytes Relative 14 (*)    Monocytes Absolute 1.4 (*)    All other components within normal limits  COMPREHENSIVE METABOLIC PANEL - Abnormal; Notable for the following:    Total Bilirubin 1.5 (*)    All other components within normal limits  URINALYSIS, ROUTINE W REFLEX MICROSCOPIC - Abnormal; Notable for the following:    Color, Urine AMBER (*) BIOCHEMICALS MAY BE AFFECTED BY COLOR   Bilirubin Urine SMALL (*)    Ketones, ur 40 (*)    Leukocytes, UA TRACE (*)    All other components within normal limits  CBC  LIPASE, BLOOD  URINE MICROSCOPIC-ADD ON   No results found.   No diagnosis found.    MDM  Gastritis, peptic ulcer disease, biliary colic, cholelithiasis, cholecystitis, cholangitis, hepatitis, renal colic, urinary tract  infection, colitis, constipation, gastroenteritis all considered among other etiologies in the patient's differential diagnosis.         Felisa Bonier, MD 01/24/11 2239

## 2011-04-23 ENCOUNTER — Encounter (HOSPITAL_COMMUNITY): Payer: Self-pay | Admitting: Emergency Medicine

## 2011-04-23 ENCOUNTER — Emergency Department (HOSPITAL_COMMUNITY)
Admission: EM | Admit: 2011-04-23 | Discharge: 2011-04-23 | Disposition: A | Discharge status: Home / Self Care | Payer: Medicaid Other | Attending: Emergency Medicine | Admitting: Emergency Medicine

## 2011-04-23 ENCOUNTER — Emergency Department (HOSPITAL_COMMUNITY): Payer: Medicaid Other

## 2011-04-23 DIAGNOSIS — R197 Diarrhea, unspecified: Secondary | ICD-10-CM | POA: Insufficient documentation

## 2011-04-23 DIAGNOSIS — F17200 Nicotine dependence, unspecified, uncomplicated: Secondary | ICD-10-CM | POA: Insufficient documentation

## 2011-04-23 DIAGNOSIS — R112 Nausea with vomiting, unspecified: Secondary | ICD-10-CM | POA: Insufficient documentation

## 2011-04-23 DIAGNOSIS — A088 Other specified intestinal infections: Secondary | ICD-10-CM | POA: Insufficient documentation

## 2011-04-23 DIAGNOSIS — R109 Unspecified abdominal pain: Secondary | ICD-10-CM | POA: Insufficient documentation

## 2011-04-23 DIAGNOSIS — A084 Viral intestinal infection, unspecified: Secondary | ICD-10-CM

## 2011-04-23 LAB — DIFFERENTIAL
Basophils Absolute: 0 10*3/uL (ref 0.0–0.1)
Lymphocytes Relative: 11 % — ABNORMAL LOW (ref 12–46)
Lymphs Abs: 1.2 10*3/uL (ref 0.7–4.0)
Monocytes Relative: 16 % — ABNORMAL HIGH (ref 3–12)

## 2011-04-23 LAB — URINE MICROSCOPIC-ADD ON

## 2011-04-23 LAB — CBC
MCV: 82.6 fL (ref 78.0–100.0)
Platelets: 410 10*3/uL — ABNORMAL HIGH (ref 150–400)
RDW: 13.8 % (ref 11.5–15.5)
WBC: 11.2 10*3/uL — ABNORMAL HIGH (ref 4.0–10.5)

## 2011-04-23 LAB — URINALYSIS, ROUTINE W REFLEX MICROSCOPIC
Glucose, UA: NEGATIVE mg/dL
Ketones, ur: 15 mg/dL — AB
Leukocytes, UA: NEGATIVE
Specific Gravity, Urine: 1.035 — ABNORMAL HIGH (ref 1.005–1.030)
pH: 6 (ref 5.0–8.0)

## 2011-04-23 LAB — COMPREHENSIVE METABOLIC PANEL
AST: 16 U/L (ref 0–37)
Albumin: 4.3 g/dL (ref 3.5–5.2)
Chloride: 100 mEq/L (ref 96–112)
Creatinine, Ser: 0.8 mg/dL (ref 0.50–1.35)
Total Bilirubin: 0.9 mg/dL (ref 0.3–1.2)
Total Protein: 8.7 g/dL — ABNORMAL HIGH (ref 6.0–8.3)

## 2011-04-23 MED ORDER — SODIUM CHLORIDE 0.9 % IV BOLUS (SEPSIS)
1000.0000 mL | Freq: Once | INTRAVENOUS | Status: AC
  Administered 2011-04-23: 1000 mL via INTRAVENOUS

## 2011-04-23 MED ORDER — ONDANSETRON 8 MG PO TBDP: 8.0000 mg | ORAL_TABLET | Freq: Three times a day (TID) | ORAL | Status: AC | PRN

## 2011-04-23 MED ORDER — FENTANYL CITRATE 0.05 MG/ML IJ SOLN
50.0000 ug | Freq: Once | INTRAMUSCULAR | Status: AC
  Administered 2011-04-23: 50 ug via INTRAVENOUS
  Filled 2011-04-23: qty 2

## 2011-04-23 MED ORDER — ONDANSETRON HCL 4 MG/2ML IJ SOLN
4.0000 mg | Freq: Once | INTRAMUSCULAR | Status: AC
  Administered 2011-04-23: 4 mg via INTRAVENOUS
  Filled 2011-04-23: qty 2

## 2011-04-23 NOTE — ED Provider Notes (Signed)
Medical screening examination/treatment/procedure(s) were performed by non-physician practitioner and as supervising physician I was immediately available for consultation/collaboration.   Lyanne Co, MD 04/23/11 [phone]

## 2011-04-23 NOTE — ED Notes (Signed)
Pt reports vomiting/diarrhea and abdominal cramping since Sunday. States he passed out after several hours of emesis and diarrhea. States has vomited several times today and diarrhea x2 today.

## 2011-04-23 NOTE — ED Provider Notes (Signed)
History     CSN: [id number]  Arrival date & time 04/23/11  4098   First MD Initiated Contact with Patient 04/23/11 0758     8:18 AM  HPI   Patient is a 31 y.o. male presenting with vomiting. The history is provided by the patient.  Emesis  This is a new problem. The current episode started 2 days ago. The problem occurs more than 10 times per day. The problem has been gradually worsening. There has been no fever. Associated symptoms include abdominal pain and diarrhea. Pertinent negatives include no chills, no fever and no headaches.    Past Medical History  Diagnosis Date  . Meningitis   . Encephalitis     Past Surgical History  Procedure Date  . Knee surgery     No family history on file.  History  Substance Use Topics  . Smoking status: Current Everyday Smoker -- 0.5 packs/day  . Smokeless tobacco: Not on file  . Alcohol Use: No      Review of Systems  Constitutional: Negative for fever and chills.  Respiratory: Negative for shortness of breath.   Cardiovascular: Negative for chest pain.  Gastrointestinal: Positive for nausea, vomiting, abdominal pain and diarrhea. Negative for constipation, blood in stool and rectal pain.  Genitourinary: Negative for dysuria, hematuria, flank pain, discharge, penile pain and testicular pain.  Musculoskeletal: Negative for back pain.  Neurological: Negative for dizziness, weakness, numbness and headaches.  All other systems reviewed and are negative.    Allergies  Ciprofloxacin; Hydrocodone-acetaminophen; Ibuprofen; Propoxyphene n-acetaminophen; and Tramadol hcl  Home Medications   Current Outpatient Rx  Name Route Sig Dispense Refill  . FAMOTIDINE 20 MG PO TABS Oral Take 1 tablet (20 mg total) by mouth 2 (two) times daily as needed for heartburn (Upset stomach). 14 tablet 0  . THERA M PLUS PO TABS Oral Take 1 tablet by mouth daily.      . OXYCODONE-ACETAMINOPHEN 5-325 MG PO TABS Oral Take 1 tablet by mouth 2 (two) times  daily as needed. For pain.       BP 128/90  Pulse 98  Temp(Src) 97.8 F (36.6 C) (Oral)  Resp 22  Ht 6\' 2"  (1.88 m)  SpO2 100%  Physical Exam  Vitals reviewed. Constitutional: He is oriented to person, place, and time. He appears well-developed and well-nourished.  HENT:  Head: Normocephalic and atraumatic.  Eyes: Conjunctivae are normal. Pupils are equal, round, and reactive to light.  Neck: Normal range of motion. Neck supple.  Cardiovascular: Normal rate, regular rhythm and normal heart sounds.  Exam reveals no gallop and no friction rub.   No murmur heard. Pulmonary/Chest: Effort normal and breath sounds normal. He has no wheezes. He has no rales. He exhibits no tenderness.  Abdominal: Soft. Bowel sounds are normal. He exhibits no distension and no mass. There is no tenderness. There is no rebound and no guarding.  Neurological: He is alert and oriented to person, place, and time.  Skin: Skin is warm and dry. No rash noted. No erythema. No pallor.  Psychiatric: He has a normal mood and affect. His behavior is normal.    ED Course  Procedures  Results for orders placed during the hospital encounter of 04/23/11  CBC      Component Value Range   WBC 11.2 (*) 4.0 - 10.5 (K/uL)   RBC 5.59  4.22 - 5.81 (MIL/uL)   Hemoglobin 16.3  13.0 - 17.0 (g/dL)   HCT 11.9  14.7 - 82.9 (%)  MCV 82.6  78.0 - 100.0 (fL)   MCH 29.2  26.0 - 34.0 (pg)   MCHC 35.3  30.0 - 36.0 (g/dL)   RDW 40.9  81.1 - 91.4 (%)   Platelets 410 (*) 150 - 400 (K/uL)  DIFFERENTIAL      Component Value Range   Neutrophils Relative 72  43 - 77 (%)   Lymphocytes Relative 11 (*) 12 - 46 (%)   Monocytes Relative 16 (*) 3 - 12 (%)   Eosinophils Relative 1  0 - 5 (%)   Basophils Relative 0  0 - 1 (%)   Neutro Abs 8.1 (*) 1.7 - 7.7 (K/uL)   Lymphs Abs 1.2  0.7 - 4.0 (K/uL)   Monocytes Absolute 1.8 (*) 0.1 - 1.0 (K/uL)   Eosinophils Absolute 0.1  0.0 - 0.7 (K/uL)   Basophils Absolute 0.0  0.0 - 0.1 (K/uL)   WBC  Morphology MILD LEFT SHIFT (1-5% METAS, OCC MYELO, OCC BANDS)    COMPREHENSIVE METABOLIC PANEL      Component Value Range   Sodium 139  135 - 145 (mEq/L)   Potassium 4.7  3.5 - 5.1 (mEq/L)   Chloride 100  96 - 112 (mEq/L)   CO2 27  19 - 32 (mEq/L)   Glucose, Bld 111 (*) 70 - 99 (mg/dL)   BUN 20  6 - 23 (mg/dL)   Creatinine, Ser 7.82  0.50 - 1.35 (mg/dL)   Calcium 9.9  8.4 - 95.6 (mg/dL)   Total Protein 8.7 (*) 6.0 - 8.3 (g/dL)   Albumin 4.3  3.5 - 5.2 (g/dL)   AST 16  0 - 37 (U/L)   ALT 11  0 - 53 (U/L)   Alkaline Phosphatase 84  39 - 117 (U/L)   Total Bilirubin 0.9  0.3 - 1.2 (mg/dL)   GFR calc non Af Amer >90  >90 (mL/min)   GFR calc Af Amer >90  >90 (mL/min)  LIPASE, BLOOD      Component Value Range   Lipase 45  11 - 59 (U/L)  URINALYSIS, ROUTINE W REFLEX MICROSCOPIC      Component Value Range   Color, Urine AMBER (*) YELLOW    APPearance CLOUDY (*) CLEAR    Specific Gravity, Urine 1.035 (*) 1.005 - 1.030    pH 6.0  5.0 - 8.0    Glucose, UA NEGATIVE  NEGATIVE (mg/dL)   Hgb urine dipstick SMALL (*) NEGATIVE    Bilirubin Urine SMALL (*) NEGATIVE    Ketones, ur 15 (*) NEGATIVE (mg/dL)   Protein, ur 30 (*) NEGATIVE (mg/dL)   Urobilinogen, UA 0.2  0.0 - 1.0 (mg/dL)   Nitrite NEGATIVE  NEGATIVE    Leukocytes, UA NEGATIVE  NEGATIVE   URINE MICROSCOPIC-ADD ON      Component Value Range   Squamous Epithelial / LPF RARE  RARE    WBC, UA 3-6  <3 (WBC/hpf)   RBC / HPF 7-10  <3 (RBC/hpf)   Bacteria, UA FEW (*) RARE    Urine-Other MUCOUS PRESENT     Dg Abd 2 Views  04/23/2011  *RADIOLOGY REPORT*  Clinical Data: No abdominal pain, nausea and vomiting  ABDOMEN - 2 VIEW  Comparison: Lumbar spine films 12/31/2000  Findings: No dilated loops of large or small bowel.  There is gas in the rectosigmoid colon.  No pathologic calcifications.  No acute bony findings.  IMPRESSION: No acute abdominal process.  Original Report Authenticated By: Genevive Bi, M.D.     MDM  Labs indicate  viral gastroenteritis. Mildly dehydrated. Patient reports symptoms have significantly improved after medication. Will discharge home with antiemetics and advised followup with primary care physician for worsening symptoms.     Thomasene Lot, PA-C 04/23/11 1017

## 2011-04-23 NOTE — ED Notes (Signed)
Patient transported to X-ray 

## 2011-05-22 ENCOUNTER — Encounter (HOSPITAL_COMMUNITY): Payer: Self-pay

## 2011-05-22 ENCOUNTER — Emergency Department (HOSPITAL_COMMUNITY)
Admission: EM | Admit: 2011-05-22 | Discharge: 2011-05-22 | Disposition: A | Discharge status: Home / Self Care | Payer: Medicaid Other | Attending: Emergency Medicine | Admitting: Emergency Medicine

## 2011-05-22 DIAGNOSIS — R197 Diarrhea, unspecified: Secondary | ICD-10-CM | POA: Insufficient documentation

## 2011-05-22 DIAGNOSIS — R42 Dizziness and giddiness: Secondary | ICD-10-CM | POA: Insufficient documentation

## 2011-05-22 DIAGNOSIS — R112 Nausea with vomiting, unspecified: Secondary | ICD-10-CM | POA: Insufficient documentation

## 2011-05-22 DIAGNOSIS — I10 Essential (primary) hypertension: Secondary | ICD-10-CM | POA: Insufficient documentation

## 2011-05-22 HISTORY — DX: Major depressive disorder, single episode, unspecified: F32.9

## 2011-05-22 HISTORY — DX: Depression, unspecified: F32.A

## 2011-05-22 HISTORY — DX: Essential (primary) hypertension: I10

## 2011-05-22 LAB — POCT I-STAT, CHEM 8
Calcium, Ion: 1.14 mmol/L (ref 1.12–1.32)
Creatinine, Ser: 0.8 mg/dL (ref 0.50–1.35)
Glucose, Bld: 92 mg/dL (ref 70–99)
Hemoglobin: 15.6 g/dL (ref 13.0–17.0)
Sodium: 140 mEq/L (ref 135–145)
TCO2: 24 mmol/L (ref 0–100)

## 2011-05-22 MED ORDER — POTASSIUM CHLORIDE CRYS ER 20 MEQ PO TBCR
20.0000 meq | EXTENDED_RELEASE_TABLET | Freq: Once | ORAL | Status: AC
  Administered 2011-05-22: 20 meq via ORAL
  Filled 2011-05-22: qty 1

## 2011-05-22 MED ORDER — ONDANSETRON 8 MG PO TBDP
8.0000 mg | ORAL_TABLET | Freq: Once | ORAL | Status: AC
  Administered 2011-05-22: 8 mg via ORAL
  Filled 2011-05-22: qty 1

## 2011-05-22 MED ORDER — ONDANSETRON HCL 8 MG PO TABS: 8.0000 mg | ORAL_TABLET | Freq: Three times a day (TID) | ORAL | Status: AC | PRN

## 2011-05-22 NOTE — Discharge Instructions (Signed)
Take zofran as need for nausea. Drink plenty of fluids. May try imodium as need for diarrhea. Your potassium level is slightly low (3.4) - eat plenty of fruits and vegetables. Follow up with primary care doctor in next couple days if symptoms fail to improve/resolve. Return to ER if worse, severe abdominal pain, persistent vomiting, high fevers, other concern.    Viral Gastroenteritis Viral gastroenteritis is also known as stomach flu. This condition affects the stomach and intestinal tract. It can cause sudden diarrhea and vomiting. The illness typically lasts 3 to 8 days. Most people develop an immune response that eventually gets rid of the virus. While this natural response develops, the virus can make you quite ill. CAUSES  Many different viruses can cause gastroenteritis, such as rotavirus or noroviruses. You can catch one of these viruses by consuming contaminated food or water. You may also catch a virus by sharing utensils or other personal items with an infected person or by touching a contaminated surface. SYMPTOMS  The most common symptoms are diarrhea and vomiting. These problems can cause a severe loss of body fluids (dehydration) and a body salt (electrolyte) imbalance. Other symptoms may include:  Fever.   Headache.   Fatigue.   Abdominal pain.  DIAGNOSIS  Your caregiver can usually diagnose viral gastroenteritis based on your symptoms and a physical exam. A stool sample may also be taken to test for the presence of viruses or other infections. TREATMENT  This illness typically goes away on its own. Treatments are aimed at rehydration. The most serious cases of viral gastroenteritis involve vomiting so severely that you are not able to keep fluids down. In these cases, fluids must be given through an intravenous line (IV). HOME CARE INSTRUCTIONS   Drink enough fluids to keep your urine clear or pale yellow. Drink small amounts of fluids frequently and increase the amounts  as tolerated.   Ask your caregiver for specific rehydration instructions.   Avoid:   Foods high in sugar.   Alcohol.   Carbonated drinks.   Tobacco.   Juice.   Caffeine drinks.   Extremely hot or cold fluids.   Fatty, greasy foods.   Too much intake of anything at one time.   Dairy products until 24 to 48 hours after diarrhea stops.   You may consume probiotics. Probiotics are active cultures of beneficial bacteria. They may lessen the amount and number of diarrheal stools in adults. Probiotics can be found in yogurt with active cultures and in supplements.   Wash your hands well to avoid spreading the virus.   Only take over-the-counter or prescription medicines for pain, discomfort, or fever as directed by your caregiver. Do not give aspirin to children. Antidiarrheal medicines are not recommended.   Ask your caregiver if you should continue to take your regular prescribed and over-the-counter medicines.   Keep all follow-up appointments as directed by your caregiver.  SEEK IMMEDIATE MEDICAL CARE IF:   You are unable to keep fluids down.   You do not urinate at least once every 6 to 8 hours.   You develop shortness of breath.   You notice blood in your stool or vomit. This may look like coffee grounds.   You have abdominal pain that increases or is concentrated in one small area (localized).   You have persistent vomiting or diarrhea.   You have a fever.   The patient is a child younger than 3 months, and he or she has a fever.  The patient is a child older than 3 months, and he or she has a fever and persistent symptoms.   The patient is a child older than 3 months, and he or she has a fever and symptoms suddenly get worse.   The patient is a baby, and he or she has no tears when crying.  MAKE SURE YOU:   Understand these instructions.   Will watch your condition.   Will get help right away if you are not doing well or get worse.  Document  Released: 03/04/2005 Document Revised: 02/21/2011 Document Reviewed: 12/19/2010 [employer] Patient Information 2012 [employer],    Nausea and Vomiting Nausea is a sick feeling that often comes before throwing up (vomiting). Vomiting is a reflex where stomach contents come out of your mouth. Vomiting can cause severe loss of body fluids (dehydration). Children and elderly adults can become dehydrated quickly, especially if they also have diarrhea. Nausea and vomiting are symptoms of a condition or disease. It is important to find the cause of your symptoms. CAUSES  Direct irritation of the stomach lining. This irritation can result from increased acid production (gastroesophageal reflux disease), infection, food poisoning, taking certain medicines (such as nonsteroidal anti-inflammatory drugs), alcohol use, or tobacco use.  Signals from the brain.These signals could be caused by a headache, heat exposure, an inner ear disturbance, increased pressure in the brain from injury, infection, a tumor, or a concussion, pain, emotional stimulus, or metabolic problems.  An obstruction in the gastrointestinal tract (bowel obstruction).  Illnesses such as diabetes, hepatitis, gallbladder problems, appendicitis, kidney problems, cancer, sepsis, atypical symptoms of a heart attack, or eating disorders.  Medical treatments such as chemotherapy and radiation.  Receiving medicine that makes you sleep (general anesthetic) during surgery.  DIAGNOSIS Your caregiver may ask for tests to be done if the problems do not improve after a few days. Tests may also be done if symptoms are severe or if the reason for the nausea and vomiting is not clear. Tests may include: Urine tests.  Blood tests.  Stool tests.  Cultures (to look for evidence of infection).  X-rays or other imaging studies.  Test results can help your caregiver make decisions about treatment or the need for additional tests. TREATMENT You need to stay well  hydrated. Drink frequently but in small amounts.You may wish to drink water, sports drinks, clear broth, or eat frozen ice pops or gelatin dessert to help stay hydrated.When you eat, eating slowly may help prevent nausea.There are also some antinausea medicines that may help prevent nausea. HOME CARE INSTRUCTIONS  Take all medicine as directed by your caregiver.  If you do not have an appetite, do not force yourself to eat. However, you must continue to drink fluids.  If you have an appetite, eat a normal diet unless your caregiver tells you differently.  Eat a variety of complex carbohydrates (rice, wheat, potatoes, bread), lean meats, yogurt, fruits, and vegetables.  Avoid high-fat foods because they are more difficult to digest.  Drink enough water and fluids to keep your urine clear or pale yellow.  If you are dehydrated, ask your caregiver for specific rehydration instructions. Signs of dehydration may include:  Severe thirst.  Dry lips and mouth.  Dizziness.  Dark urine.  Decreasing urine frequency and amount.  Confusion.  Rapid breathing or pulse.  SEEK IMMEDIATE MEDICAL CARE IF:  You have blood or brown flecks (like coffee grounds) in your vomit.  You have black or bloody stools.  You have  a severe headache or stiff neck.  You are confused.  You have severe abdominal pain.  You have chest pain or trouble breathing.  You do not urinate at least once every 8 hours.  You develop cold or clammy skin.  You continue to vomit for longer than 24 to 48 hours.  You have a fever.  MAKE SURE YOU:  Understand these instructions.  Will watch your condition.  Will get help right away if you are not doing well or get worse.  Document Released: 03/04/2005 Document Revised: 02/21/2011 Document Reviewed: 08/01/2010 [facility] Patient Information 2012 [place], [place].     Diarrhea Infections caused by germs (bacterial) or a virus commonly cause diarrhea. Your caregiver has determined that  with time, rest and fluids, the diarrhea should improve. In general, eat normally while drinking more water than usual. Although water may prevent dehydration, it does not contain salt and minerals (electrolytes). Broths, weak tea without caffeine and oral rehydration solutions (ORS) replace fluids and electrolytes. Small amounts of fluids should be taken frequently. Large amounts at one time may not be tolerated. Plain water may be harmful in infants and the elderly. Oral rehydrating solutions (ORS) are available at pharmacies and grocery stores. ORS replace water and important electrolytes in proper proportions. Sports drinks are not as effective as ORS and may be harmful due to sugars worsening diarrhea.  ORS is especially recommended for use in children with diarrhea. As a general guideline for children, replace any new fluid losses from diarrhea and/or vomiting with ORS as follows:   If your child weighs 22 pounds or under (10 kg or less), give 60-120 mL ( -  cup or 2 - 4 ounces) of ORS for each episode of diarrheal stool or vomiting episode.   If your child weighs more than 22 pounds (more than 10 kgs), give 120-240 mL ( - 1 cup or 4 - 8 ounces) of ORS for each diarrheal stool or episode of vomiting.   While correcting for dehydration, children should eat normally. However, foods high in sugar should be avoided because this may worsen diarrhea. Large amounts of carbonated soft drinks, juice, gelatin desserts and other highly sugared drinks should be avoided.   After correction of dehydration, other liquids that are appealing to the child may be added. Children should drink small amounts of fluids frequently and fluids should be increased as tolerated. Children should drink enough fluids to keep urine clear or pale yellow.   Adults should eat normally while drinking more fluids than usual. Drink small amounts of fluids frequently and increase as tolerated. Drink enough fluids to keep urine clear  or pale yellow. Broths, weak decaffeinated tea, lemon lime soft drinks (allowed to go flat) and ORS replace fluids and electrolytes.   Avoid:   Carbonated drinks.   Juice.   Extremely hot or cold fluids.   Caffeine drinks.   Fatty, greasy foods.   Alcohol.   Tobacco.   Too much intake of anything at one time.   Gelatin desserts.   Probiotics are active cultures of beneficial bacteria. They may lessen the amount and number of diarrheal stools in adults. Probiotics can be found in yogurt with active cultures and in supplements.   Wash hands well to avoid spreading bacteria and virus.   Anti-diarrheal medications are not recommended for infants and children.   Only take over-the-counter or prescription medicines for pain, discomfort or fever as directed by your caregiver. Do not give aspirin to children because it  may cause Reye's Syndrome.   For adults, ask your caregiver if you should continue all prescribed and over-the-counter medicines.   If your caregiver has given you a follow-up appointment, it is very important to keep that appointment. Not keeping the appointment could result in a chronic or permanent injury, and disability. If there is any problem keeping the appointment, you must call back to this facility for assistance.  SEEK IMMEDIATE MEDICAL CARE IF:   You or your child is unable to keep fluids down or other symptoms or problems become worse in spite of treatment.   Vomiting or diarrhea develops and becomes persistent.   There is vomiting of blood or bile (green material).   There is blood in the stool or the stools are black and tarry.   There is no urine output in 6-8 hours or there is only a small amount of very dark urine.   Abdominal pain develops, increases or localizes.   You have a fever.   Your baby is older than 3 months with a rectal temperature of 102 F (38.9 C) or higher.   Your baby is 66 months old or younger with a rectal temperature of  100.4 F (38 C) or higher.   You or your child develops excessive weakness, dizziness, fainting or extreme thirst.   You or your child develops a rash, stiff neck, severe headache or become irritable or sleepy and difficult to awaken.  MAKE SURE YOU:   Understand these instructions.   Will watch your condition.   Will get help right away if you are not doing well or get worse.  Document Released: 02/22/2002 Document Revised: 02/21/2011 Document Reviewed: 01/09/2009 [facility] Patient Information 2012 [place], [place].

## 2011-05-22 NOTE — ED Notes (Signed)
Pt presents with no acute distress. Pt assisted with his cane to stretcher.  Pt alert and active with care- no active N/V at present

## 2011-05-22 NOTE — ED Provider Notes (Addendum)
History     CSN: [id number]  Arrival date & time 05/22/11  1306   First MD Initiated Contact with Patient 05/22/11 1325      Chief Complaint  Patient presents with  . Nausea  . Emesis  . Dizziness    (Consider location/radiation/quality/duration/timing/severity/associated sxs/prior treatment) Patient is a 31 y.o. male presenting with vomiting. The history is provided by the patient.  Emesis  Associated symptoms include diarrhea. Pertinent negatives include no cough, no fever and no headaches.  pt with nvd for past 2 days. States ate chinese prior to symptom onset. No known ill contacts. Emesis clear to slightly yellow in color. No bloody emesis. No greenish emesis. Diarrhea loose to watery stools. Not bloody. No recent abx use. occ abd cramping that comes and goes, no constant/focal abd pain. No gu c/o. No fever or chills. No faintness or dizziness.   Past Medical History  Diagnosis Date  . Meningitis   . Encephalitis   . Hypertension   . Depression     Past Surgical History  Procedure Date  . Knee surgery   . Joint replacement     No family history on file.  History  Substance Use Topics  . Smoking status: Current Everyday Smoker -- 0.5 packs/day  . Smokeless tobacco: Not on file  . Alcohol Use: No      Review of Systems  Constitutional: Negative for fever.  HENT: Negative for neck pain.   Eyes: Negative for redness.  Respiratory: Negative for cough and shortness of breath.   Cardiovascular: Negative for chest pain.  Gastrointestinal: Positive for vomiting and diarrhea.  Genitourinary: Negative for flank pain.  Musculoskeletal: Negative for back pain.  Skin: Negative for rash.  Neurological: Negative for headaches.  Hematological: Does not bruise/bleed easily.  Psychiatric/Behavioral: Negative for confusion.    Allergies  Ciprofloxacin; Hydrocodone-acetaminophen; Ibuprofen; Propoxyphene n-acetaminophen; and Tramadol hcl  Home Medications   Current  Outpatient Rx  Name Route Sig Dispense Refill  . THERA M PLUS PO TABS Oral Take 1 tablet by mouth daily.      . AMPHETAMINE-DEXTROAMPHETAMINE 10 MG PO TABS Oral Take 10 mg by mouth daily.    Marland Kitchen FAMOTIDINE 20 MG PO TABS Oral Take 1 tablet (20 mg total) by mouth 2 (two) times daily as needed for heartburn (Upset stomach). 14 tablet 0    BP 120/83  Pulse 107  Temp(Src) 98 F (36.7 C) (Oral)  Resp 18  Ht 6\' 2"  (1.88 m)  Wt 160 lb (72.576 kg)  BMI 20.54 kg/m2  SpO2 99%  Physical Exam  Nursing note and vitals reviewed. Constitutional: He is oriented to person, place, and time. He appears well-developed and well-nourished. No distress.  HENT:  Head: Atraumatic.  Eyes: Pupils are equal, round, and reactive to light.  Neck: Neck supple. No tracheal deviation present.  Cardiovascular: Normal rate, regular rhythm, normal heart sounds and intact distal pulses.   Pulmonary/Chest: Effort normal and breath sounds normal. No accessory muscle usage. No respiratory distress.  Abdominal: Soft. Bowel sounds are normal. He exhibits no distension and no mass. There is no tenderness. There is no rebound and no guarding.  Musculoskeletal: Normal range of motion. He exhibits no edema.  Neurological: He is alert and oriented to person, place, and time.  Skin: Skin is warm and dry.  Psychiatric: He has a normal mood and affect.    ED Course  Procedures (including critical care time)  Results for orders placed during the hospital encounter of 05/22/11  POCT I-STAT, CHEM 8      Component Value Range   Sodium 140  135 - 145 (mEq/L)   Potassium 3.4 (*) 3.5 - 5.1 (mEq/L)   Chloride 105  96 - 112 (mEq/L)   BUN 20  6 - 23 (mg/dL)   Creatinine, Ser 1.61  0.50 - 1.35 (mg/dL)   Glucose, Bld 92  70 - 99 (mg/dL)   Calcium, Ion 0.96  0.45 - 1.32 (mmol/L)   TCO2 24  0 - 100 (mmol/L)   Hemoglobin 15.6  13.0 - 17.0 (g/dL)   HCT 40.9  81.1 - 91.4 (%)       MDM  zofran po. Po fluids.   kcl po. No nv. abd  soft nt.    Recheck abd soft nt. No nvd in ed. Discussed labs.    Suzi Roots, MD 05/22/11 1408  Suzi Roots, MD 05/22/11 [phone]

## 2011-11-08 ENCOUNTER — Encounter (HOSPITAL_COMMUNITY): Payer: Self-pay | Admitting: *Deleted

## 2011-11-08 ENCOUNTER — Emergency Department (HOSPITAL_COMMUNITY)
Admission: EM | Admit: 2011-11-08 | Discharge: 2011-11-08 | Disposition: A | Discharge status: Home / Self Care | Payer: Medicaid Other | Source: Home / Self Care | Attending: Emergency Medicine | Admitting: Emergency Medicine

## 2011-11-08 DIAGNOSIS — L089 Local infection of the skin and subcutaneous tissue, unspecified: Secondary | ICD-10-CM

## 2011-11-08 HISTORY — DX: Cerebral infarction, unspecified: I63.9

## 2011-11-08 MED ORDER — CEPHALEXIN 500 MG PO CAPS: 500.0000 mg | ORAL_CAPSULE | Freq: Four times a day (QID) | ORAL | Status: AC

## 2011-11-08 MED ORDER — DOXYCYCLINE HYCLATE 100 MG PO CAPS: 100.0000 mg | ORAL_CAPSULE | Freq: Two times a day (BID) | ORAL | Status: AC

## 2011-11-08 MED ORDER — OXYCODONE-ACETAMINOPHEN 5-325 MG PO TABS: 2.0000 | ORAL_TABLET | ORAL | Status: AC | PRN

## 2011-11-08 NOTE — ED Notes (Signed)
Pt  Arrived  Via  A  Wheelchair  He   Reports  Pain r  Lower  Leg  For  Several  Days  He   Denied  Any  specefic  Injury      He  Ambulates  With a  Cane           He  Is  Otherwise  Alert  And  Oriented            He  Has  Residual  Neurological         deficiet

## 2011-11-08 NOTE — ED Provider Notes (Signed)
History     CSN: [id number]  Arrival date & time 11/08/11  1831   First MD Initiated Contact with Patient 11/08/11 1933      Chief Complaint  Patient presents with  . Leg Pain    (Consider location/radiation/quality/duration/timing/severity/associated sxs/prior treatment) Patient is a 31 y.o. male presenting with leg pain. The history is provided by the patient. No language interpreter was used.  Leg Pain  The incident occurred more than 2 days ago. The incident occurred at home. There was no injury mechanism. The quality of the pain is described as aching. The pain is at a severity of 6/10. The pain is moderate. Pertinent negatives include no numbness. He reports no foreign bodies present. He has tried nothing for the symptoms.  Pt complains of pain to right leg.   Pt has a history of infections and problems with left leg  Past Medical History  Diagnosis Date  . Meningitis   . Encephalitis   . Hypertension   . Depression   . Stroke     Past Surgical History  Procedure Date  . Knee surgery   . Joint replacement     No family history on file.  History  Substance Use Topics  . Smoking status: Current Everyday Smoker -- 0.5 packs/day  . Smokeless tobacco: Not on file  . Alcohol Use: No      Review of Systems  Neurological: Negative for numbness.  All other systems reviewed and are negative.    Allergies  Ciprofloxacin; Hydrocodone-acetaminophen; Ibuprofen; Propoxyphene-acetaminophen; and Tramadol hcl  Home Medications   Current Outpatient Rx  Name Route Sig Dispense Refill  . CYCLOBENZAPRINE HCL 10 MG PO TABS Oral Take 10 mg by mouth 3 (three) times daily as needed.    . BC HEADACHE POWDER PO Oral Take 1 packet by mouth 2 (two) times daily as needed. For pain.    . CEPHALEXIN 500 MG PO CAPS Oral Take 1 capsule (500 mg total) by mouth 4 (four) times daily. 40 capsule 0  . DOXYCYCLINE HYCLATE 100 MG PO CAPS Oral Take 1 capsule (100 mg total) by mouth 2 (two)  times daily. 20 capsule 0  . ADULT MULTIVITAMIN W/MINERALS CH Oral Take 1 tablet by mouth daily.    . OXYCODONE-ACETAMINOPHEN 5-325 MG PO TABS Oral Take 2 tablets by mouth every 4 (four) hours as needed for pain. 15 tablet 0    BP 110/76  Pulse 80  Temp 98.7 F (37.1 C) (Oral)  Resp 18  SpO2 100%  Physical Exam  Nursing note and vitals reviewed. Constitutional: He is oriented to person, place, and time. He appears well-developed and well-nourished.  Musculoskeletal: He exhibits tenderness.       Warm right leg,  Tender to palpation  Neurological: He is alert and oriented to person, place, and time.  Skin: Skin is warm.  Psychiatric: He has a normal mood and affect.    ED Course  Procedures (including critical care time)  Labs Reviewed - No data to display No results found.   1. Skin infection       MDM    Keflex and doxycycline.   Pt given rx for percocet      Lonia Skinner [place], [place] 11/08/11 [phone]

## 2011-11-10 NOTE — ED Provider Notes (Signed)
Medical screening examination/treatment/procedure(s) were performed by non-physician practitioner and as supervising physician I was immediately available for consultation/collaboration.  Raynald Blend, MD 11/10/11 1046

## 2012-02-23 ENCOUNTER — Encounter (HOSPITAL_COMMUNITY): Payer: Self-pay | Admitting: *Deleted

## 2012-02-23 ENCOUNTER — Emergency Department (INDEPENDENT_AMBULATORY_CARE_PROVIDER_SITE_OTHER)
Admission: EM | Admit: 2012-02-23 | Discharge: 2012-02-23 | Disposition: A | Discharge status: Home / Self Care | Payer: Medicaid Other | Source: Home / Self Care | Attending: Emergency Medicine | Admitting: Emergency Medicine

## 2012-02-23 ENCOUNTER — Emergency Department (INDEPENDENT_AMBULATORY_CARE_PROVIDER_SITE_OTHER): Payer: Medicaid Other

## 2012-02-23 DIAGNOSIS — A499 Bacterial infection, unspecified: Secondary | ICD-10-CM

## 2012-02-23 DIAGNOSIS — M7150 Other bursitis, not elsewhere classified, unspecified site: Secondary | ICD-10-CM

## 2012-02-23 DIAGNOSIS — M7110 Other infective bursitis, unspecified site: Secondary | ICD-10-CM

## 2012-02-23 HISTORY — DX: Paralytic syndrome, unspecified: G83.9

## 2012-02-23 LAB — CBC WITH DIFFERENTIAL/PLATELET
Basophils Absolute: 0.1 10*3/uL (ref 0.0–0.1)
Basophils Relative: 1 % (ref 0–1)
Eosinophils Absolute: 0.4 10*3/uL (ref 0.0–0.7)
MCH: 29 pg (ref 26.0–34.0)
MCHC: 35 g/dL (ref 30.0–36.0)
Neutrophils Relative %: 50 % (ref 43–77)
Platelets: 513 10*3/uL — ABNORMAL HIGH (ref 150–400)
RBC: 5.35 MIL/uL (ref 4.22–5.81)
RDW: 13.7 % (ref 11.5–15.5)

## 2012-02-23 MED ORDER — HYDROCODONE-ACETAMINOPHEN 5-325 MG PO TABS: ORAL_TABLET | ORAL | Status: DC

## 2012-02-23 MED ORDER — DICLOFENAC SODIUM 75 MG PO TBEC: 75.0000 mg | DELAYED_RELEASE_TABLET | Freq: Two times a day (BID) | ORAL | Status: DC

## 2012-02-23 MED ORDER — CEPHALEXIN 500 MG PO CAPS: 500.0000 mg | ORAL_CAPSULE | Freq: Three times a day (TID) | ORAL | Status: DC

## 2012-02-23 NOTE — ED Notes (Signed)
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 No injury or known trauma.

## 2012-02-23 NOTE — ED Provider Notes (Signed)
Chief Complaint  Patient presents with  . Knee Pain    History of Present Illness:   The patient is a 31 year old male who presents with a one-day history of left knee pain. He has had problems with the knee for about 10 years. In that time he's had about 10 surgeries, 5 of these were done at [facility] and 5 at [place]. He had a septic prepatellar bursitis. This was initially operated on then became infected again and had to be operated on 9 subsequent times. He has a nonhealing ulcer on his knee that is crusted over. There is been a small amount of purulent drainage recently. The knee is swollen. He has a full range of motion the knee it hurts him to move. He denies any other joint pain.  Review of Systems:  Other than noted above, the patient denies any of the following symptoms: Systemic:  No fevers, chills, sweats, or aches.  No fatigue or tiredness. Musculoskeletal:  No joint pain, arthritis, bursitis, swelling, back pain, or neck pain. Neurological:  No muscular weakness, paresthesias, headache, or trouble with speech or coordination.  No dizziness.  PMFSH:  Past medical history, family history, social history, meds, and allergies were reviewed.  Physical Exam:   Vital signs:  BP 157/81  Pulse 90  Temp 98.5 F (36.9 C) (Oral)  Resp 19  SpO2 100% Gen:  Alert and oriented times 3.  In no distress. Musculoskeletal:  There is chronic swelling of the prepatellar bursa which feels firm, like scar tissue. He has a crusted ulcer overlying this with a small amount of pus draining from it. There is no heat, erythema, or induration. The knee itself has a full range of motion with no pain in the knee joint itself, but pain referred to the prepatellar area. Otherwise, all joints had a full a ROM with no swelling, bruising or deformity.  No edema, pulses full. Extremities were warm and pink.  Capillary refill was brisk.  Skin:  Clear, warm and dry.  No rash. Neuro:  Alert and oriented  times 3.  Muscle strength was normal.  Sensation was intact to light touch.   Results for orders placed during the hospital encounter of 02/23/12  CBC WITH DIFFERENTIAL      Component Value Range   WBC 7.5  4.0 - 10.5 K/uL   RBC 5.35  4.22 - 5.81 MIL/uL   Hemoglobin 15.5  13.0 - 17.0 g/dL   HCT 84.6  96.2 - 95.2 %   MCV 82.8  78.0 - 100.0 fL   MCH 29.0  26.0 - 34.0 pg   MCHC 35.0  30.0 - 36.0 g/dL   RDW 84.1  32.4 - 40.1 %   Platelets 513 (*) 150 - 400 K/uL   Neutrophils Relative 50  43 - 77 %   Neutro Abs 3.8  1.7 - 7.7 K/uL   Lymphocytes Relative 27  12 - 46 %   Lymphs Abs 2.0  0.7 - 4.0 K/uL   Monocytes Relative 18 (*) 3 - 12 %   Monocytes Absolute 1.3 (*) 0.1 - 1.0 K/uL   Eosinophils Relative 5  0 - 5 %   Eosinophils Absolute 0.4  0.0 - 0.7 K/uL   Basophils Relative 1  0 - 1 %   Basophils Absolute 0.1  0.0 - 0.1 K/uL    Radiology:  Dg Knee Complete 4 Views Left  02/23/2012  *RADIOLOGY REPORT*  Clinical Data: 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 Symptoms have been worse over the last 2 days.  LEFT KNEE - COMPLETE 4+ VIEW  Comparison: Two-view left knee 08/02/2010.  Findings: Soft tissue swelling anterior to the tibial tuberosity is similar to the prior exam.  There is some chronic underlying irregularity of the tibial tuberosity.  No significant joint effusion is present.  The osseous structures are intact.  IMPRESSION:  1.  Prominent soft tissue swelling anterior to the tibial tuberosity is similar to the prior exam and likely related to chronic inflammation. 2.  No acute osseous abnormality.   Original Report Authenticated By: Marin Roberts, M.D.    I reviewed the images independently and personally and concur with the radiologist's findings.  Assessment:  The encounter diagnosis was Septic bursitis.  Plan:   1.  The following meds were prescribed:   New Prescriptions   CEPHALEXIN (KEFLEX) 500 MG CAPSULE    Take 1 capsule (500 mg total) by mouth 3 (three)  times daily.   DICLOFENAC (VOLTAREN) 75 MG EC TABLET    Take 1 tablet (75 mg total) by mouth 2 (two) times daily.   HYDROCODONE-ACETAMINOPHEN (NORCO/VICODIN) 5-325 MG PER TABLET    1 to 2 tabs every 4 to 6 hours as needed for pain.   2.  The patient was instructed in symptomatic care, including rest and activity, elevation, application of ice and compression.  Appropriate handouts were given. 3.  The patient was told to return if becoming worse in any way, if no better in 3 or 4 days, and given some red flag symptoms that would indicate earlier return.   4.  The patient was told to follow up with his doctor at [facility] next week.    Reuben Likes, MD 02/23/12 2113

## 2012-02-26 LAB — WOUND CULTURE: Gram Stain: NONE SEEN

## 2012-03-02 ENCOUNTER — Emergency Department (HOSPITAL_COMMUNITY)
Admission: EM | Admit: 2012-03-02 | Discharge: 2012-03-02 | Disposition: A | Discharge status: Home / Self Care | Payer: Medicaid Other | Attending: Emergency Medicine | Admitting: Emergency Medicine

## 2012-03-02 DIAGNOSIS — Z8739 Personal history of other diseases of the musculoskeletal system and connective tissue: Secondary | ICD-10-CM | POA: Insufficient documentation

## 2012-03-02 DIAGNOSIS — G839 Paralytic syndrome, unspecified: Secondary | ICD-10-CM | POA: Insufficient documentation

## 2012-03-02 DIAGNOSIS — F17200 Nicotine dependence, unspecified, uncomplicated: Secondary | ICD-10-CM | POA: Insufficient documentation

## 2012-03-02 DIAGNOSIS — Z8673 Personal history of transient ischemic attack (TIA), and cerebral infarction without residual deficits: Secondary | ICD-10-CM | POA: Insufficient documentation

## 2012-03-02 DIAGNOSIS — L0291 Cutaneous abscess, unspecified: Secondary | ICD-10-CM

## 2012-03-02 DIAGNOSIS — F329 Major depressive disorder, single episode, unspecified: Secondary | ICD-10-CM | POA: Insufficient documentation

## 2012-03-02 DIAGNOSIS — L02419 Cutaneous abscess of limb, unspecified: Secondary | ICD-10-CM | POA: Insufficient documentation

## 2012-03-02 DIAGNOSIS — Z79899 Other long term (current) drug therapy: Secondary | ICD-10-CM | POA: Insufficient documentation

## 2012-03-02 DIAGNOSIS — F3289 Other specified depressive episodes: Secondary | ICD-10-CM | POA: Insufficient documentation

## 2012-03-02 DIAGNOSIS — Z8661 Personal history of infections of the central nervous system: Secondary | ICD-10-CM | POA: Insufficient documentation

## 2012-03-02 MED ORDER — SULFAMETHOXAZOLE-TRIMETHOPRIM 800-160 MG PO TABS: 1.0000 | ORAL_TABLET | Freq: Two times a day (BID) | ORAL | Status: DC

## 2012-03-02 MED ORDER — OXYCODONE-ACETAMINOPHEN 5-325 MG PO TABS: 2.0000 | ORAL_TABLET | ORAL | Status: DC | PRN

## 2012-03-02 NOTE — ED Notes (Signed)
Per EMS, pt c/o left knee pain x 2 days, has had multiple sx on same knee, denies any new injuries.

## 2012-03-02 NOTE — ED Notes (Signed)
Pt states he has been in so much pain he has lost his appetite

## 2012-03-02 NOTE — ED Provider Notes (Signed)
History  This chart was scribed for Emilia Beck, a non-physician practitioner, working with Lyanne Co, MD by Bennett Scrape, ED Scribe. This patient was seen in room WTR6/WTR6 and the patient's care was started at 2:30 PM.  CSN: [id number]  Arrival date & time 03/02/12  1427   First MD Initiated Contact with Patient 03/02/12 1430      Chief Complaint  Patient presents with  . Knee Pain     The history is provided by the patient. No language interpreter was used.   [name] is a 31 y.o. male brought in by ambulance, who presents to the Emergency Department complaining of approximately 9 days of gradual onset, gradually worsening, constant left knee pain described as sharp and stabbing with associated swelling and weakness in the left leg that became suddenly worse 2 days ago when he woke up. He reports that he fell due to the pain yesterday and has had a decreased appetite as well. He was seen at an urgent care one week ago for the same and was discharged with pain medication and antibiotics. He states that neither are improving his symptoms. He reports that he has experienced similar problems with his knee over the past 10 years and he has a h/o septic prepatellar bursitis. He denies joint pain in other extremities, HA, nausea, SOB, numbness or tingling as associated symptoms. He has a h/o CVA, depression and HTN. He is a current everyday smoker but denies alcohol use.  PCP is [facility] and he denies contacting them before coming here for evaluation.   Past Medical History  Diagnosis Date  . Meningitis   . Encephalitis   . Hypertension   . Depression   . Stroke   . 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 Knee surgery   . Joint replacement     Family History  Problem Relation Age of Onset  . Family history unknown: Yes    History  Substance Use Topics  .  Smoking status: Current Every Day Smoker -- 0.5 packs/day  . Smokeless tobacco: Not on file  . Alcohol Use: No      Review of Systems  Constitutional: Negative for fever and chills.  HENT: Negative for congestion.   Respiratory: Negative for cough and shortness of breath.   Gastrointestinal: Negative for nausea, vomiting and diarrhea.  Musculoskeletal: Negative for back pain.       Positive for left knee pain  Neurological: Positive for weakness. Negative for numbness and headaches.  All other systems reviewed and are negative.    Allergies  Ciprofloxacin; Hydrocodone-acetaminophen; Ibuprofen; Propoxyphene-acetaminophen; and Tramadol hcl  Home Medications   Current Outpatient Rx  Name  Route  Sig  Dispense  Refill  . BC HEADACHE POWDER PO   Oral   Take 1 packet by mouth 2 (two) times daily as needed. For pain.         . CEPHALEXIN 500 MG PO CAPS   Oral   Take 1 capsule (500 mg total) by mouth 3 (three) times daily.   30 capsule   0   . CYCLOBENZAPRINE HCL 10 MG PO TABS   Oral   Take 10 mg by mouth 3 (three) times daily as needed.         Marland Kitchen DICLOFENAC SODIUM 75 MG PO TBEC   Oral   Take 1 tablet (75 mg total)  by mouth 2 (two) times daily.   20 tablet   0   . HYDROCODONE-ACETAMINOPHEN 5-325 MG PO TABS      1 to 2 tabs every 4 to 6 hours as needed for pain.   20 tablet   0   . ADULT MULTIVITAMIN W/MINERALS CH   Oral   Take 1 tablet by mouth daily.           Triage Vitals: BP 113/69  Pulse 92  Temp 97.9 F (36.6 C) (Oral)  Resp 16  SpO2 100%  Physical Exam  Nursing note and vitals reviewed. Constitutional: He is oriented to person, place, and time. He appears well-developed and well-nourished. No distress.  HENT:  Head: Normocephalic and atraumatic.  Eyes: Conjunctivae normal and EOM are normal.  Neck: Neck supple. No tracheal deviation present.  Cardiovascular: Normal rate and regular rhythm.   Pulmonary/Chest: Effort normal and breath sounds  normal. No respiratory distress.  Abdominal: Soft. There is no tenderness.  Musculoskeletal:       Left knee ROM is limited due to pain, tenderness to palpation over the anterior knee distal to the patella  Neurological: He is alert and oriented to person, place, and time.       Sensation is equal and intact bilaterally  Skin: Skin is warm and dry.  Psychiatric: He has a normal mood and affect. His behavior is normal.    ED Course  Procedures (including critical care time)  INCISION AND DRAINAGE Performed by: Emilia Beck Consent: Verbal consent obtained. Risks and benefits: risks, benefits and alternatives were discussed Type: abscess  Body area: left knee  Anesthesia: local infiltration  Incision was made with an 11 blade scalpel.  Local anesthetic: lidocaine 2% with epinephrine  Anesthetic total: 2 ml  Complexity: complex Blunt dissection to break up loculations  Drainage: purulent and serosanguinous  Drainage amount: 5mL  Packing material: 1/4 in iodoform gauze  Patient tolerance: Patient tolerated the procedure well with no immediate complications.     DIAGNOSTIC STUDIES: Oxygen Saturation is 100% on room air, normal by my interpretation.    COORDINATION OF CARE: 2:58 PM- Discussed treatment plan which includes draining abscess and taking PO antibiotics as directed with pt at bedside and pt agreed to plan.   Labs Reviewed - No data to display No results found.   1. Abscess       MDM  3:40 PM Infection drained without complication. Patient is a febrile and non toxic appearing. I will prescribe the patient Bactrim and Percocet for pain. Patient instructed to remove gauze in 3 days. Patient instructed to return with worsening or concerning symptoms.     I personally performed the services described in this documentation, which was scribed in my presence. The recorded information has been reviewed and is accurate.    Emilia Beck,  PA-C 03/02/12 1545

## 2012-03-02 NOTE — ED Notes (Signed)
Pt was seen at urgent care on 02/23/12 for cultures on knee wound to check for staff or MRSA pt has not called to find out results. Pt states his left knee has been hurting more severely the past 2 days that his friends had to carry him up the stairs to his apartment

## 2012-03-03 NOTE — ED Provider Notes (Signed)
Medical screening examination/treatment/procedure(s) were performed by non-physician practitioner and as supervising physician I was immediately available for consultation/collaboration.   Lyanne Co, MD 03/03/12 [phone]

## 2012-03-09 NOTE — ED Notes (Signed)
Wound culture L knee: Abundant Staph. Aureus. Pt. treated with Keflex.  Not on sensitivity.  Message sent to Dr. Lorenz Coaster.  He said treatment should be adequate because it is sensitive to Oxacillin.  He also said the ED switched him to Septra which should also be adequate. Vassie Moselle 03/09/2012

## 2012-03-30 ENCOUNTER — Encounter (HOSPITAL_BASED_OUTPATIENT_CLINIC_OR_DEPARTMENT_OTHER): Payer: Medicaid Other | Attending: General Surgery

## 2012-03-30 DIAGNOSIS — A4901 Methicillin susceptible Staphylococcus aureus infection, unspecified site: Secondary | ICD-10-CM | POA: Insufficient documentation

## 2012-03-30 DIAGNOSIS — S81009A Unspecified open wound, unspecified knee, initial encounter: Secondary | ICD-10-CM | POA: Insufficient documentation

## 2012-03-30 DIAGNOSIS — W19XXXA Unspecified fall, initial encounter: Secondary | ICD-10-CM | POA: Insufficient documentation

## 2012-03-31 NOTE — Progress Notes (Signed)
[facility]  NAME:  TERRI, MALERBA NO.:  [id number]  MEDICAL RECORD NO.:  [id number]      DATE OF BIRTH:  [birth year]  PHYSICIAN:  Ardath Sax, M.D.           VISIT DATE:                                  OFFICE VISIT   This is a 32 year old male, who was been here before because of a wound on his left knee.  This man has a history of a fall with a wound over his patella, which later developed into an infected patellar bursitis. He has had it operated on before where they drained purulent material and put him on long-term antibiotics.  He returns now with a deep draining wound, it goes right down to the patella with chronic granulation tissue in it.  The x-ray of the knee so soft swelling, but no osseous evidence of osteomyelitis.  The cultures showed Staph aureus and this patient has been seen by Dr. Mardene Speak in the past and on looking at this wound, I really think the only way is going to heal is have this hold inflammatory area going from the skin and it has tracked down to the prepatellar bursa and excise it and perhaps even close.  So, I have called and made an appointment with Dr. Mardene Speak for him to see this patient again.  He is afebrile now with normal vital signs.  His weight is 190 pounds, he is 6 feet and 2 inches.  His blood pressure is 133/71.  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  We will see him here if and when Dr. Mardene Speak sees him and makes further plans for him.     Ardath Sax, M.D.     PP/MEDQ  D:  03/30/2012  T:  03/31/2012  Job:  [id number]

## 2012-07-29 ENCOUNTER — Emergency Department (HOSPITAL_COMMUNITY)
Admission: EM | Admit: 2012-07-29 | Discharge: 2012-07-30 | Disposition: A | Discharge status: Home / Self Care | Payer: Medicaid Other | Attending: Emergency Medicine | Admitting: Emergency Medicine

## 2012-07-29 ENCOUNTER — Encounter (HOSPITAL_COMMUNITY): Payer: Self-pay | Admitting: *Deleted

## 2012-07-29 DIAGNOSIS — F3289 Other specified depressive episodes: Secondary | ICD-10-CM | POA: Insufficient documentation

## 2012-07-29 DIAGNOSIS — Z8679 Personal history of other diseases of the circulatory system: Secondary | ICD-10-CM | POA: Insufficient documentation

## 2012-07-29 DIAGNOSIS — I10 Essential (primary) hypertension: Secondary | ICD-10-CM | POA: Insufficient documentation

## 2012-07-29 DIAGNOSIS — Z8673 Personal history of transient ischemic attack (TIA), and cerebral infarction without residual deficits: Secondary | ICD-10-CM | POA: Insufficient documentation

## 2012-07-29 DIAGNOSIS — Z79899 Other long term (current) drug therapy: Secondary | ICD-10-CM | POA: Insufficient documentation

## 2012-07-29 DIAGNOSIS — Z881 Allergy status to other antibiotic agents status: Secondary | ICD-10-CM | POA: Insufficient documentation

## 2012-07-29 DIAGNOSIS — L02419 Cutaneous abscess of limb, unspecified: Secondary | ICD-10-CM | POA: Insufficient documentation

## 2012-07-29 DIAGNOSIS — Z888 Allergy status to other drugs, medicaments and biological substances status: Secondary | ICD-10-CM | POA: Insufficient documentation

## 2012-07-29 DIAGNOSIS — L02416 Cutaneous abscess of left lower limb: Secondary | ICD-10-CM

## 2012-07-29 DIAGNOSIS — F329 Major depressive disorder, single episode, unspecified: Secondary | ICD-10-CM | POA: Insufficient documentation

## 2012-07-29 DIAGNOSIS — F17200 Nicotine dependence, unspecified, uncomplicated: Secondary | ICD-10-CM | POA: Insufficient documentation

## 2012-07-29 NOTE — ED Notes (Signed)
Pt with large abscess to left knee with draining. Pt reports that knee has been hurting for 3 days and today noticed draining. Pt reports he has had chills but on exam pt meets no SIRS criteria.

## 2012-07-30 MED ORDER — SULFAMETHOXAZOLE-TRIMETHOPRIM 400-80 MG PO TABS: 1.0000 | ORAL_TABLET | Freq: Two times a day (BID) | ORAL | Status: DC

## 2012-07-30 MED ORDER — SULFAMETHOXAZOLE-TRIMETHOPRIM 400-80 MG PO TABS
1.0000 | ORAL_TABLET | Freq: Once | ORAL | Status: AC
  Administered 2012-07-30: 1 via ORAL
  Filled 2012-07-30: qty 1

## 2012-07-30 NOTE — ED Provider Notes (Signed)
Medical screening examination/treatment/procedure(s) were performed by non-physician practitioner and as supervising physician I was immediately available for consultation/collaboration.  Sunnie Nielsen, MD 07/30/12 [phone]

## 2012-07-30 NOTE — ED Provider Notes (Signed)
History     CSN: [id number]  Arrival date & time 07/29/12  2316   First MD Initiated Contact with Patient 07/30/12 0000      Chief Complaint  Patient presents with  . Knee Pain    left  . Abscess    (Consider location/radiation/quality/duration/timing/severity/associated sxs/prior treatment) HPI Comments: Mr. Rejeana Brock is a 32 year old, African American gentleman, with a history of recurrent abscesses, to the anterior portion of his left knee.  His last abscess was in December of last year.  He cleared up after antibiotics, but noticed 3 days ago, that it was again, sore today.  He, states while in the shower.  It spontaneously ruptured and it draining clear fluid has minimal pain with extreme flexion of his knee  Patient is a 32 y.o. male presenting with knee pain and abscess. The history is provided by the patient.  Knee Pain Location:  Knee Time since incident:  3 days Knee location:  L knee Pain details:    Quality:  Aching   Radiates to:  Does not radiate   Severity:  Mild   Timing:  Constant Chronicity:  Recurrent Dislocation: no   Foreign body present:  No foreign bodies Associated symptoms: no fever   Abscess Associated symptoms: no fever     Past Medical History  Diagnosis Date  . Meningitis   . Encephalitis   . Hypertension   . Depression   . Stroke   . 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 Knee surgery    . Joint replacement      History reviewed. No pertinent family history.  History  Substance Use Topics  . Smoking status: Current Every Day Smoker -- 0.50 packs/day  . Smokeless tobacco: Not on file  . Alcohol Use: No      Review of Systems  Constitutional: Negative for fever and chills.  Musculoskeletal: Negative for joint swelling.  Skin: Positive for wound.  All other systems reviewed and are negative.    Allergies  Ciprofloxacin;  Hydrocodone-acetaminophen; Ibuprofen; Propoxyphene-acetaminophen; and Tramadol hcl  Home Medications   Current Outpatient Rx  Name  Route  Sig  Dispense  Refill  . Aspirin-Salicylamide-Caffeine (BC HEADACHE POWDER PO)   Oral   Take 1 packet by mouth 2 (two) times daily as needed. For pain.         . cyclobenzaprine (FLEXERIL) 10 MG tablet   Oral   Take 10 mg by mouth 3 (three) times daily as needed. For muscle spasms         . diclofenac (VOLTAREN) 75 MG EC tablet   Oral   Take 75 mg by mouth 2 (two) times daily.         Marland Kitchen HYDROcodone-acetaminophen (NORCO/VICODIN) 5-325 MG per tablet   Oral   Take 1 tablet by mouth every 4 (four) hours as needed. For pain         . Multiple Vitamin (MULITIVITAMIN WITH MINERALS) TABS   Oral   Take 1 tablet by mouth daily.         . naproxen sodium (ANAPROX) 220 MG tablet   Oral   Take 440 mg by mouth 2 (two) times daily as needed. For pain         . oxyCODONE-acetaminophen (PERCOCET/ROXICET) 5-325 MG per tablet   Oral   Take 2 tablets by mouth every 4 (four) hours as needed for  pain.   6 tablet   0   . sulfamethoxazole-trimethoprim (BACTRIM,SEPTRA) 400-80 MG per tablet   Oral   Take 1 tablet by mouth 2 (two) times daily.   13 tablet   0   . sulfamethoxazole-trimethoprim (SEPTRA DS) 800-160 MG per tablet   Oral   Take 1 tablet by mouth every 12 (twelve) hours.   20 tablet   0     BP 153/86  Pulse 72  Temp(Src) 98.9 F (37.2 C) (Oral)  Resp 20  SpO2 95%  Physical Exam  Nursing note and vitals reviewed. Constitutional: He is oriented to person, place, and time. He appears well-developed.  HENT:  Head: Normocephalic.  Eyes: Pupils are equal, round, and reactive to light.  Cardiovascular: Normal rate.   Pulmonary/Chest: Effort normal.  Musculoskeletal: He exhibits tenderness. He exhibits no edema.       Left knee: He exhibits normal range of motion, no swelling, no effusion, no laceration and no bony tenderness.        Legs: 2 cm, round, chronically irritated area with central 1 cm draining site  Neurological: He is alert and oriented to person, place, and time.  Skin: Skin is warm. No erythema.    ED Course  Procedures (including critical care time)  Labs Reviewed - No data to display No results found.   1. Abscess of knee, left       MDM  Recurrent abscess of the anterior left knee, with discharge home with prescription for Bactrim instructed patient to followup with Dr. Concepcion Elk at [facility] clinic         Arman Filter, NP 07/30/12 [phone]

## 2013-01-05 ENCOUNTER — Encounter (HOSPITAL_COMMUNITY): Payer: Self-pay | Admitting: Emergency Medicine

## 2013-01-05 ENCOUNTER — Emergency Department (HOSPITAL_COMMUNITY)
Admission: EM | Admit: 2013-01-05 | Discharge: 2013-01-05 | Disposition: A | Discharge status: Home / Self Care | Payer: Medicaid Other | Attending: Emergency Medicine | Admitting: Emergency Medicine

## 2013-01-05 ENCOUNTER — Emergency Department (HOSPITAL_COMMUNITY): Payer: Medicaid Other

## 2013-01-05 DIAGNOSIS — Z79899 Other long term (current) drug therapy: Secondary | ICD-10-CM | POA: Insufficient documentation

## 2013-01-05 DIAGNOSIS — R5381 Other malaise: Secondary | ICD-10-CM | POA: Insufficient documentation

## 2013-01-05 DIAGNOSIS — R42 Dizziness and giddiness: Secondary | ICD-10-CM | POA: Insufficient documentation

## 2013-01-05 DIAGNOSIS — M79606 Pain in leg, unspecified: Secondary | ICD-10-CM | POA: Diagnosis present

## 2013-01-05 DIAGNOSIS — F17200 Nicotine dependence, unspecified, uncomplicated: Secondary | ICD-10-CM | POA: Insufficient documentation

## 2013-01-05 DIAGNOSIS — Z8669 Personal history of other diseases of the nervous system and sense organs: Secondary | ICD-10-CM | POA: Insufficient documentation

## 2013-01-05 DIAGNOSIS — M25579 Pain in unspecified ankle and joints of unspecified foot: Secondary | ICD-10-CM | POA: Insufficient documentation

## 2013-01-05 DIAGNOSIS — Z8659 Personal history of other mental and behavioral disorders: Secondary | ICD-10-CM | POA: Insufficient documentation

## 2013-01-05 DIAGNOSIS — I10 Essential (primary) hypertension: Secondary | ICD-10-CM | POA: Insufficient documentation

## 2013-01-05 DIAGNOSIS — M25569 Pain in unspecified knee: Secondary | ICD-10-CM | POA: Insufficient documentation

## 2013-01-05 DIAGNOSIS — L84 Corns and callosities: Secondary | ICD-10-CM | POA: Insufficient documentation

## 2013-01-05 DIAGNOSIS — R51 Headache: Secondary | ICD-10-CM | POA: Insufficient documentation

## 2013-01-05 DIAGNOSIS — M79605 Pain in left leg: Secondary | ICD-10-CM

## 2013-01-05 DIAGNOSIS — Z8673 Personal history of transient ischemic attack (TIA), and cerebral infarction without residual deficits: Secondary | ICD-10-CM | POA: Insufficient documentation

## 2013-01-05 LAB — POCT I-STAT, CHEM 8
BUN: 19 mg/dL (ref 6–23)
Chloride: 102 mEq/L (ref 96–112)
Creatinine, Ser: 1.1 mg/dL (ref 0.50–1.35)
Glucose, Bld: 98 mg/dL (ref 70–99)
Potassium: 4 mEq/L (ref 3.5–5.1)
Sodium: 144 mEq/L (ref 135–145)

## 2013-01-05 MED ORDER — OXYCODONE-ACETAMINOPHEN 5-325 MG PO TABS
1.0000 | ORAL_TABLET | Freq: Once | ORAL | Status: AC
  Administered 2013-01-05: 1 via ORAL
  Filled 2013-01-05: qty 1

## 2013-01-05 MED ORDER — SODIUM CHLORIDE 0.9 % IV BOLUS (SEPSIS)
1000.0000 mL | INTRAVENOUS | Status: AC
  Administered 2013-01-05: 1000 mL via INTRAVENOUS

## 2013-01-05 MED ORDER — OXYCODONE-ACETAMINOPHEN 5-325 MG PO TABS: 1.0000 | ORAL_TABLET | Freq: Four times a day (QID) | ORAL | Status: DC | PRN

## 2013-01-05 MED ORDER — METOCLOPRAMIDE HCL 5 MG/ML IJ SOLN
10.0000 mg | Freq: Once | INTRAMUSCULAR | Status: AC
  Administered 2013-01-05: 10 mg via INTRAVENOUS
  Filled 2013-01-05: qty 2

## 2013-01-05 MED ORDER — DEXAMETHASONE SODIUM PHOSPHATE 10 MG/ML IJ SOLN
10.0000 mg | Freq: Once | INTRAMUSCULAR | Status: AC
  Administered 2013-01-05: 10 mg via INTRAVENOUS
  Filled 2013-01-05: qty 1

## 2013-01-05 MED ORDER — DIPHENHYDRAMINE HCL 50 MG/ML IJ SOLN
25.0000 mg | Freq: Once | INTRAMUSCULAR | Status: AC
  Administered 2013-01-05: 25 mg via INTRAVENOUS
  Filled 2013-01-05: qty 1

## 2013-01-05 NOTE — ED Notes (Signed)
Pt c/o of dizziness, weakness that start yesterday. Also c/o left leg pain increases when walking. Pain 10/10.Denies n/v/d.

## 2013-01-05 NOTE — ED Provider Notes (Addendum)
CSN: [id number]     Arrival date & time 01/05/13  1549 History   First MD Initiated Contact with Patient 01/05/13 1613     Chief Complaint  Patient presents with  . Dizziness  . Weakness  . 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 The patient notes he developed mild dizziness while singing in the choir at church 2 days ago. He states that later that day he developed a gradual onset right-sided parietal dull headache. He states that the headache has been waxing and waning since then. He currently rates it a 6/10 in intensity. He notes that the dizziness has persisted as well and is worse with ambulation. He also notes generalized weakness which began around the same time but denies any focal weakness. He hasn't denies any fevers or numbness or tingling. He states that at baseline he has some mild altered sensation to the left side of his body and face as well as some left-sided weakness which is unchanged from baseline. PSA he has had some mild diarrhea for the past 2 days. He also complains of left knee and left foot pain which began yesterday. This pain is aching in nature. The knee pain is a 6/10 and the foot pain is a 9/10. He denies any injuries. He has had similar symptoms in his left lower extremity before. The patient has not tried anything to relieve his symptoms thus far.  Patient is a 32 y.o. male presenting with weakness and leg pain. The history is provided by the patient.  Weakness Associated symptoms include headaches. Pertinent negatives include no chest pain, no abdominal pain and no shortness of breath.  Leg Pain Associated symptoms: no fever and no neck pain     Past Medical History  Diagnosis Date   . Meningitis   . Encephalitis   . Hypertension   . Depression   . Stroke   . 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 Knee surgery    . Joint replacement     History reviewed. No pertinent family history. History  Substance Use Topics  . Smoking status: Current Every Day Smoker -- 0.50 packs/day  . Smokeless tobacco: Not on file  . Alcohol Use: No    Review of Systems  Constitutional: Negative for fever.  HENT: Negative for drooling and rhinorrhea.   Eyes: Negative for pain.  Respiratory: Negative for cough and shortness of breath.   Cardiovascular: Negative for chest pain and leg swelling.  Gastrointestinal: Negative for nausea, vomiting, abdominal pain and diarrhea.  Genitourinary: Negative for dysuria and hematuria.       Left knee and left foot pain.   Musculoskeletal: Negative for gait problem and neck pain.  Skin: Negative for color change.  Neurological: Positive for dizziness, weakness (generalized) and headaches. Negative for numbness.  Hematological: Negative for adenopathy.  Psychiatric/Behavioral: Negative for behavioral problems.  All other systems reviewed and are negative.    Allergies  Ciprofloxacin; Hydrocodone-acetaminophen; Ibuprofen; Propoxyphene-acetaminophen; and Tramadol hcl  Home Medications   Current Outpatient Rx  Name  Route  Sig  Dispense  Refill  . cyclobenzaprine (FLEXERIL) 10 MG tablet   Oral   Take 10 mg by mouth 3 (three) times daily  as needed. For muscle spasms         . naproxen sodium (ANAPROX) 220 MG tablet   Oral   Take 440 mg by mouth 2 (two) times daily as needed. For pain          BP 137/96  Pulse 89  Temp(Src) 98.4 F (36.9 C) (Oral)  Resp 18  SpO2 100% Physical Exam  Nursing note and vitals reviewed. Constitutional: He is oriented to person, place, and time. He appears well-developed and well-nourished.  HENT:   Head: Normocephalic and atraumatic.  Right Ear: External ear normal.  Left Ear: External ear normal.  Nose: Nose normal.  Mouth/Throat: Oropharynx is clear and moist. No oropharyngeal exudate.  Eyes: Conjunctivae and EOM are normal. Pupils are equal, round, and reactive to light.  Neck: Normal range of motion. Neck supple.  Cardiovascular: Normal rate, regular rhythm, normal heart sounds and intact distal pulses.  Exam reveals no gallop and no friction rub.   No murmur heard. Pulmonary/Chest: Effort normal and breath sounds normal. No respiratory distress. He has no wheezes.  Abdominal: Soft. Bowel sounds are normal. He exhibits no distension. There is no tenderness. There is no rebound and no guarding.  Musculoskeletal: Normal range of motion. He exhibits no edema and no tenderness.  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 No fluctuance noted. No erythema noted in this area.   Normal visual appearance of left knee without focal tenderness or obvious effusion. Mild reproduction of symptoms with flexion and extension of the left knee.  A small callus is noted on the distal medial plantar surface of the left foot. The patient has tenderness to palpation of this callus.  2+ distal pulses.   Neurological: He is alert and oriented to person, place, and time. He has normal strength. A cranial nerve deficit (altered sensation to light touch on left side of face, unchanged from baseline per pt) is present. No sensory deficit.  Mild difficulty w/ ambulation d/t pain in the left knee and left foot. He uses a cane at baseline. He is able to bare weight. No truncal ataxia noted. No significant increased in dizziness w/ ambulation.   Skin: Skin is warm and dry.  Psychiatric: He has a normal mood and affect. His behavior is normal.    ED Course  Procedures (including critical care time) Labs Review Labs Reviewed  GLUCOSE, CAPILLARY  POCT I-STAT,  CHEM 8   Imaging Review Dg Knee Complete 4 Views Left  01/05/2013   CLINICAL DATA:  History of left knee pain. No history of trauma.  EXAM: LEFT KNEE - COMPLETE 4+ VIEW  COMPARISON:  None.  FINDINGS: There is no evidence of joint effusion. Alignment is normal. Joint spaces are preserved. No fracture or dislocation is evident. No soft tissue lesions are seen.  IMPRESSION: No abnormality is evident. .   Electronically Signed   By: Onalee Hua  Call M.D.   On: 01/05/2013 17:02   Dg Foot Complete Left  01/05/2013   CLINICAL DATA:  Pain  EXAM: LEFT FOOT - COMPLETE 3+ VIEW  COMPARISON:  07/06/2007  FINDINGS: Large ossicle dorsal to the distal navicular is unchanged from the prior study.  Negative for fracture. Degenerative change in subluxation of the 1st metatarsal filling healed joint. No erosion.  IMPRESSION: No acute bony abnormality.   Electronically Signed   By: Marlan Palau M.D.   On: 01/05/2013 17:06     Date: 01/05/2013  Rate: 76  Rhythm:  normal sinus rhythm  QRS Axis: normal  Intervals: normal  ST/T Wave abnormalities: ST elevations inferiorly  Conduction Disutrbances:none  Narrative Interpretation: ST elevations inferiorly which are likely early repolarization, they are also seen on previous ecg on Apr 15, 2009  Old EKG Reviewed: unchanged   MDM   1. Dizziness   2. Leg pain, left   3. Headache    5:15 PM the patient is afebrile and vital signs are unremarkable here. Low suspicion for septic joint based on exam and symptoms. He has had similar pain in his left knee before. Denies injury. Will get screening imaging, IVF, migraine cocktail for HA. Pt has close f/u w/ orthopedics.   6:42 PM: HA now gone. Orthostatics normal. Pt feeling much better. Imaging non-contrib. Unknown cause of pt's worsening knee/foot pain. Will provide small Rx for percocet. I have discussed the diagnosis/risks/treatment options with the patient and believe the pt to be eligible for discharge home to follow-up  with his orthopedist as scheduled next week. We also discussed returning to the ED immediately if new or worsening sx occur. We discussed the sx which are most concerning (e.g., worsening dizziness, fever, redness of LLE, inc pain) that necessitate immediate return. Any new prescriptions provided to the patient are listed below.  New Prescriptions   OXYCODONE-ACETAMINOPHEN (PERCOCET) 5-325 MG PER TABLET    Take 1 tablet by mouth every 6 (six) hours as needed for pain.       Junius Argyle, MD 01/05/13 1845  Junius Argyle, MD 01/05/13 1610

## 2013-01-21 ENCOUNTER — Other Ambulatory Visit: Payer: Self-pay

## 2013-03-01 ENCOUNTER — Emergency Department (HOSPITAL_COMMUNITY)
Admission: EM | Admit: 2013-03-01 | Discharge: 2013-03-01 | Disposition: A | Discharge status: Home / Self Care | Payer: Medicaid Other | Source: Home / Self Care

## 2013-03-05 ENCOUNTER — Emergency Department (HOSPITAL_COMMUNITY)
Admission: EM | Admit: 2013-03-05 | Discharge: 2013-03-05 | Disposition: A | Discharge status: Home / Self Care | Payer: Medicaid Other | Attending: Emergency Medicine | Admitting: Emergency Medicine

## 2013-03-05 ENCOUNTER — Encounter (HOSPITAL_COMMUNITY): Payer: Self-pay | Admitting: Emergency Medicine

## 2013-03-05 DIAGNOSIS — R197 Diarrhea, unspecified: Secondary | ICD-10-CM | POA: Insufficient documentation

## 2013-03-05 DIAGNOSIS — Z8659 Personal history of other mental and behavioral disorders: Secondary | ICD-10-CM | POA: Insufficient documentation

## 2013-03-05 DIAGNOSIS — R0602 Shortness of breath: Secondary | ICD-10-CM | POA: Insufficient documentation

## 2013-03-05 DIAGNOSIS — I10 Essential (primary) hypertension: Secondary | ICD-10-CM | POA: Insufficient documentation

## 2013-03-05 DIAGNOSIS — R059 Cough, unspecified: Secondary | ICD-10-CM | POA: Insufficient documentation

## 2013-03-05 DIAGNOSIS — R42 Dizziness and giddiness: Secondary | ICD-10-CM | POA: Insufficient documentation

## 2013-03-05 DIAGNOSIS — H53149 Visual discomfort, unspecified: Secondary | ICD-10-CM | POA: Insufficient documentation

## 2013-03-05 DIAGNOSIS — Z8669 Personal history of other diseases of the nervous system and sense organs: Secondary | ICD-10-CM | POA: Insufficient documentation

## 2013-03-05 DIAGNOSIS — Z8673 Personal history of transient ischemic attack (TIA), and cerebral infarction without residual deficits: Secondary | ICD-10-CM | POA: Insufficient documentation

## 2013-03-05 DIAGNOSIS — R5381 Other malaise: Secondary | ICD-10-CM | POA: Insufficient documentation

## 2013-03-05 DIAGNOSIS — R05 Cough: Secondary | ICD-10-CM | POA: Insufficient documentation

## 2013-03-05 DIAGNOSIS — R51 Headache: Secondary | ICD-10-CM | POA: Insufficient documentation

## 2013-03-05 DIAGNOSIS — F17200 Nicotine dependence, unspecified, uncomplicated: Secondary | ICD-10-CM | POA: Insufficient documentation

## 2013-03-05 LAB — CBC WITH DIFFERENTIAL/PLATELET
Eosinophils Absolute: 0 10*3/uL (ref 0.0–0.7)
Eosinophils Relative: 1 % (ref 0–5)
Hemoglobin: 15.7 g/dL (ref 13.0–17.0)
Lymphocytes Relative: 23 % (ref 12–46)
Lymphs Abs: 2 10*3/uL (ref 0.7–4.0)
MCH: 28.5 pg (ref 26.0–34.0)
MCV: 82.4 fL (ref 78.0–100.0)
Monocytes Absolute: 1.6 10*3/uL — ABNORMAL HIGH (ref 0.1–1.0)
Monocytes Relative: 19 % — ABNORMAL HIGH (ref 3–12)
RBC: 5.5 MIL/uL (ref 4.22–5.81)
WBC: 8.5 10*3/uL (ref 4.0–10.5)

## 2013-03-05 LAB — BASIC METABOLIC PANEL
BUN: 17 mg/dL (ref 6–23)
CO2: 27 mEq/L (ref 19–32)
Calcium: 9.4 mg/dL (ref 8.4–10.5)
GFR calc non Af Amer: >90 mL/min (ref >90)
Glucose, Bld: 96 mg/dL (ref 70–99)
Potassium: 4.6 mEq/L (ref 3.5–5.1)

## 2013-03-05 MED ORDER — METOCLOPRAMIDE HCL 5 MG/ML IJ SOLN
10.0000 mg | Freq: Once | INTRAMUSCULAR | Status: AC
  Administered 2013-03-05: 10 mg via INTRAVENOUS
  Filled 2013-03-05: qty 2

## 2013-03-05 MED ORDER — MECLIZINE HCL 25 MG PO TABS
25.0000 mg | ORAL_TABLET | Freq: Once | ORAL | Status: AC
  Administered 2013-03-05: 25 mg via ORAL
  Filled 2013-03-05: qty 1

## 2013-03-05 MED ORDER — PROCHLORPERAZINE EDISYLATE 5 MG/ML IJ SOLN
10.0000 mg | Freq: Once | INTRAMUSCULAR | Status: AC
  Administered 2013-03-05: 10 mg via INTRAVENOUS
  Filled 2013-03-05: qty 2

## 2013-03-05 MED ORDER — DIPHENHYDRAMINE HCL 50 MG/ML IJ SOLN
25.0000 mg | Freq: Once | INTRAMUSCULAR | Status: AC
  Administered 2013-03-05: 25 mg via INTRAVENOUS
  Filled 2013-03-05: qty 1

## 2013-03-05 MED ORDER — SODIUM CHLORIDE 0.9 % IV BOLUS (SEPSIS)
1000.0000 mL | Freq: Once | INTRAVENOUS | Status: AC
  Administered 2013-03-05: 1000 mL via INTRAVENOUS

## 2013-03-05 MED ORDER — DIAZEPAM 5 MG PO TABS
5.0000 mg | ORAL_TABLET | Freq: Once | ORAL | Status: AC
  Administered 2013-03-05: 5 mg via ORAL
  Filled 2013-03-05: qty 1

## 2013-03-05 MED ORDER — MECLIZINE HCL 25 MG PO TABS: 25.0000 mg | ORAL_TABLET | Freq: Four times a day (QID) | ORAL | Status: DC

## 2013-03-05 NOTE — ED Provider Notes (Signed)
CSN: [id number]     Arrival date & time 03/05/13  1425 History   First MD Initiated Contact with Patient 03/05/13 1510     Chief Complaint  Patient presents with  . Fatigue  . Headache   (Consider location/radiation/quality/duration/timing/severity/associated sxs/prior Treatment) HPI Comments: Patient is a 32 year old male with history of meningitis, encephalitis, hypertension, depression, stroke who presents today with 4 days of worsening headache, dizziness, and shortness of breath. He was at urgent care on Monday waiting to be seen for his vertigo. This has been an issue that has been gradually worsening for the past year. Rapid movements trigger his vertigo. He describes it as the room spinning around him. Closing his eyes improves his symptoms. He does not feel dizzy right now. He left urgent care because it was a 2.5 hour wait time. When he was at urgent care he "felt like it all hit him". He has a right sided sharp stabbing pain in the center of his head and to the right. This has been constant since Monday. It is the worst it has been right now. He has associated photophobia. No visual disturbance including double vision, blurry vision. He developed one episode of shortness of breath yesterday. He put a hot rag on his face which improved his shortness of breath and it has not returned since that time. He developed a wet cough today. He had one episode of nonbloody diarrhea today. He denies any fevers, chills, nausea, vomiting, abdominal pain, chest pain, pleuritic pain.  The history is provided by the patient. No language interpreter was used.    Past Medical History  Diagnosis Date  . Meningitis   . Encephalitis   . Hypertension   . Depression   . Stroke   . 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 Knee surgery    . Joint replacement     No family history on file. History   Substance Use Topics  . Smoking status: Current Every Day Smoker -- 0.50 packs/day  . Smokeless tobacco: Never Used  . Alcohol Use: No    Review of Systems  Constitutional: Positive for fatigue. Negative for fever and chills.  Eyes: Positive for photophobia. Negative for visual disturbance.  Respiratory: Positive for cough and shortness of breath.   Cardiovascular: Negative for chest pain.  Gastrointestinal: Positive for diarrhea. Negative for nausea, vomiting and abdominal pain.  Skin: Negative for rash.  All other systems reviewed and are negative.    Allergies  Ciprofloxacin; Hydrocodone-acetaminophen; Ibuprofen; Propoxyphene-acetaminophen; and Tramadol hcl  Home Medications   Current Outpatient Rx  Name  Route  Sig  Dispense  Refill  . naproxen sodium (ANAPROX) 220 MG tablet   Oral   Take 440 mg by mouth 2 (two) times daily as needed. For pain          BP 126/88  Pulse 106  Temp(Src) 98.4 F (36.9 C) (Oral)  Resp 18  SpO2 98% Physical Exam  Nursing note and vitals reviewed. Constitutional: He is oriented to person, place, and time. He appears well-developed and well-nourished. No distress.  HENT:  Head: Normocephalic and atraumatic.  Right Ear: Tympanic membrane, external ear and ear canal normal.  Left Ear: Tympanic membrane, external ear and ear canal normal.  Nose: Nose normal.  Mouth/Throat: Uvula is midline and oropharynx is clear and moist.  No temporal artery tenderness  Eyes: Conjunctivae and  EOM are normal. Pupils are equal, round, and reactive to light.  Neck: Normal range of motion. No tracheal deviation present.  No nuchal rigidity or meningeal signs  Cardiovascular: Normal rate, regular rhythm and normal heart sounds.   Pulmonary/Chest: Effort normal and breath sounds normal. No stridor.  Abdominal: Soft. He exhibits no distension. There is no tenderness.  Musculoskeletal: Normal range of motion.  Neurological: He is alert and oriented to  person, place, and time. He has normal strength.  Strength 5/5 in extremities bilaterally.   Skin: Skin is warm and dry. He is not diaphoretic.  Psychiatric: He has a normal mood and affect. His behavior is normal.  Speech is stuttered and slow.     ED Course  Procedures (including critical care time) Labs Review Labs Reviewed  CBC WITH DIFFERENTIAL - Abnormal; Notable for the following:    Monocytes Relative 19 (*)    Monocytes Absolute 1.6 (*)    All other components within normal limits  BASIC METABOLIC PANEL   Imaging Review No results found.  EKG Interpretation   None       MDM   1. Headache   2. Vertigo    Pt HA treated and improved while in ED.  Presentation is like pts typical HA and non concerning for [facility], [facility], Meningitis, or temporal arteritis. Pt is afebrile with no focal neuro deficits, nuchal rigidity, or change in vision. Pt is to follow up with PCP to discuss prophylactic medication. Pt given resource guide to establish care with new PCP. Pt verbalizes understanding and is agreeable with plan to dc. Dr. Gwendolyn Grant evaluated patient and agrees with plan.     Mora Bellman, PA-C 03/05/13 [phone]

## 2013-03-05 NOTE — ED Provider Notes (Signed)
Medical screening examination/treatment/procedure(s) were conducted as a shared visit with non-physician practitioner(s) and myself.  I personally evaluated the patient during the encounter.  EKG Interpretation   None       Patient with headache. Also evidence of mild dizziness. Concern for possible vertigo. 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 He is is neurologic baseline. On my initial exam, he states his headache and dizziness are improved after her headache cocktail. Patient is doing well and is stable for discharge  Dagmar Hait, MD 03/05/13 2244

## 2013-03-05 NOTE — ED Notes (Signed)
Per EMS-Patient reports that he had SOB and weakness 4 days ago and went to an UC and left when not seen . Patient states when he got out of school today his symptoms increased and also began having a headache.

## 2013-06-28 ENCOUNTER — Inpatient Hospital Stay (HOSPITAL_COMMUNITY): Payer: Medicaid Other

## 2013-06-28 ENCOUNTER — Emergency Department (HOSPITAL_COMMUNITY): Payer: Medicaid Other

## 2013-06-28 ENCOUNTER — Encounter (HOSPITAL_COMMUNITY): Payer: Self-pay | Admitting: Emergency Medicine

## 2013-06-28 ENCOUNTER — Inpatient Hospital Stay (HOSPITAL_COMMUNITY)
Admission: EM | Admit: 2013-06-28 | Discharge: 2013-06-30 | DRG: 871 | Disposition: A | Discharge status: Home / Self Care | Payer: Medicaid Other | Attending: Internal Medicine | Admitting: Internal Medicine

## 2013-06-28 DIAGNOSIS — F3289 Other specified depressive episodes: Secondary | ICD-10-CM

## 2013-06-28 DIAGNOSIS — R0789 Other chest pain: Secondary | ICD-10-CM | POA: Diagnosis present

## 2013-06-28 DIAGNOSIS — F1210 Cannabis abuse, uncomplicated: Secondary | ICD-10-CM | POA: Diagnosis present

## 2013-06-28 DIAGNOSIS — IMO0002 Reserved for concepts with insufficient information to code with codable children: Secondary | ICD-10-CM

## 2013-06-28 DIAGNOSIS — I10 Essential (primary) hypertension: Secondary | ICD-10-CM | POA: Diagnosis present

## 2013-06-28 DIAGNOSIS — F329 Major depressive disorder, single episode, unspecified: Secondary | ICD-10-CM

## 2013-06-28 DIAGNOSIS — F17200 Nicotine dependence, unspecified, uncomplicated: Secondary | ICD-10-CM

## 2013-06-28 DIAGNOSIS — E871 Hypo-osmolality and hyponatremia: Secondary | ICD-10-CM | POA: Diagnosis present

## 2013-06-28 DIAGNOSIS — Z8661 Personal history of infections of the central nervous system: Secondary | ICD-10-CM

## 2013-06-28 DIAGNOSIS — J189 Pneumonia, unspecified organism: Secondary | ICD-10-CM | POA: Diagnosis present

## 2013-06-28 DIAGNOSIS — Z66 Do not resuscitate: Secondary | ICD-10-CM | POA: Diagnosis present

## 2013-06-28 DIAGNOSIS — A419 Sepsis, unspecified organism: Principal | ICD-10-CM | POA: Diagnosis present

## 2013-06-28 DIAGNOSIS — R6510 Systemic inflammatory response syndrome (SIRS) of non-infectious origin without acute organ dysfunction: Secondary | ICD-10-CM

## 2013-06-28 DIAGNOSIS — Z8673 Personal history of transient ischemic attack (TIA), and cerebral infarction without residual deficits: Secondary | ICD-10-CM

## 2013-06-28 DIAGNOSIS — K5900 Constipation, unspecified: Secondary | ICD-10-CM | POA: Diagnosis present

## 2013-06-28 LAB — BASIC METABOLIC PANEL
BUN: 18 mg/dL (ref 6–23)
CHLORIDE: 96 meq/L (ref 96–112)
CO2: 25 mEq/L (ref 19–32)
Calcium: 9.1 mg/dL (ref 8.4–10.5)
Creatinine, Ser: 0.73 mg/dL (ref 0.50–1.35)
GFR calc non Af Amer: >90 mL/min (ref >90)
Glucose, Bld: 93 mg/dL (ref 70–99)
POTASSIUM: 4.7 meq/L (ref 3.7–5.3)
Sodium: 135 mEq/L — ABNORMAL LOW (ref 137–147)

## 2013-06-28 LAB — HEPATIC FUNCTION PANEL
ALT: 20 U/L (ref 0–53)
AST: 30 U/L (ref 0–37)
Albumin: 3.7 g/dL (ref 3.5–5.2)
Alkaline Phosphatase: 71 U/L (ref 39–117)
Bilirubin, Direct: <0.2 mg/dL (ref 0.0–0.3)
TOTAL PROTEIN: 7.6 g/dL (ref 6.0–8.3)
Total Bilirubin: 1 mg/dL (ref 0.3–1.2)

## 2013-06-28 LAB — PROCALCITONIN

## 2013-06-28 LAB — URINALYSIS, ROUTINE W REFLEX MICROSCOPIC
BILIRUBIN URINE: NEGATIVE
Glucose, UA: NEGATIVE mg/dL
HGB URINE DIPSTICK: NEGATIVE
KETONES UR: NEGATIVE mg/dL
LEUKOCYTES UA: NEGATIVE
Nitrite: NEGATIVE
PROTEIN: 30 mg/dL — AB
Specific Gravity, Urine: 1.027 (ref 1.005–1.030)
Urobilinogen, UA: 1 mg/dL (ref 0.0–1.0)
pH: 7 (ref 5.0–8.0)

## 2013-06-28 LAB — PRO B NATRIURETIC PEPTIDE: PRO B NATRI PEPTIDE: 9.5 pg/mL (ref 0–125)

## 2013-06-28 LAB — URINE MICROSCOPIC-ADD ON

## 2013-06-28 LAB — CBC
HEMATOCRIT: 37.3 % — AB (ref 39.0–52.0)
Hemoglobin: 12.9 g/dL — ABNORMAL LOW (ref 13.0–17.0)
MCH: 28.7 pg (ref 26.0–34.0)
MCHC: 34.6 g/dL (ref 30.0–36.0)
MCV: 83.1 fL (ref 78.0–100.0)
Platelets: 433 10*3/uL — ABNORMAL HIGH (ref 150–400)
RBC: 4.49 MIL/uL (ref 4.22–5.81)
RDW: 13 % (ref 11.5–15.5)
WBC: 18.4 10*3/uL — AB (ref 4.0–10.5)

## 2013-06-28 LAB — LACTIC ACID, PLASMA: Lactic Acid, Venous: 0.6 mmol/L (ref 0.5–2.2)

## 2013-06-28 LAB — I-STAT TROPONIN, ED: Troponin i, poc: 0.02 ng/mL (ref 0.00–0.08)

## 2013-06-28 MED ORDER — SODIUM CHLORIDE 0.9 % IV SOLN
INTRAVENOUS | Status: DC
  Administered 2013-06-28 – 2013-06-29 (×2): via INTRAVENOUS

## 2013-06-28 MED ORDER — GUAIFENESIN-DM 100-10 MG/5ML PO SYRP: 5.0000 mL | ORAL_SOLUTION | ORAL | Status: DC | PRN

## 2013-06-28 MED ORDER — CEFEPIME HCL 1 G IJ SOLR
1.0000 g | Freq: Three times a day (TID) | INTRAMUSCULAR | Status: DC
  Administered 2013-06-29 – 2013-06-30 (×3): 1 g via INTRAVENOUS
  Filled 2013-06-28 (×7): qty 1

## 2013-06-28 MED ORDER — NAPROXEN 250 MG PO TABS
250.0000 mg | ORAL_TABLET | Freq: Two times a day (BID) | ORAL | Status: DC | PRN
  Filled 2013-06-28: qty 1

## 2013-06-28 MED ORDER — SODIUM CHLORIDE 0.9 % IV BOLUS (SEPSIS)
1000.0000 mL | Freq: Once | INTRAVENOUS | Status: AC
  Administered 2013-06-28: 1000 mL via INTRAVENOUS

## 2013-06-28 MED ORDER — NAPROXEN SODIUM 220 MG PO TABS: 220.0000 mg | ORAL_TABLET | Freq: Two times a day (BID) | ORAL | Status: DC | PRN

## 2013-06-28 MED ORDER — VANCOMYCIN HCL IN DEXTROSE 1-5 GM/200ML-% IV SOLN
1000.0000 mg | Freq: Three times a day (TID) | INTRAVENOUS | Status: DC
  Administered 2013-06-29 – 2013-06-30 (×3): 1000 mg via INTRAVENOUS
  Filled 2013-06-28 (×7): qty 200

## 2013-06-28 MED ORDER — IOHEXOL 350 MG/ML SOLN
100.0000 mL | Freq: Once | INTRAVENOUS | Status: AC | PRN
  Administered 2013-06-28: 100 mL via INTRAVENOUS

## 2013-06-28 MED ORDER — ENOXAPARIN SODIUM 40 MG/0.4ML ~~LOC~~ SOLN
40.0000 mg | Freq: Every day | SUBCUTANEOUS | Status: DC
  Administered 2013-06-28 – 2013-06-29 (×2): 40 mg via SUBCUTANEOUS
  Filled 2013-06-28 (×3): qty 0.4

## 2013-06-28 MED ORDER — PIPERACILLIN-TAZOBACTAM 3.375 G IVPB 30 MIN
3.3750 g | Freq: Once | INTRAVENOUS | Status: DC
  Filled 2013-06-28: qty 50

## 2013-06-28 MED ORDER — BENZONATATE 100 MG PO CAPS
100.0000 mg | ORAL_CAPSULE | Freq: Three times a day (TID) | ORAL | Status: DC | PRN
  Administered 2013-06-28: 100 mg via ORAL
  Filled 2013-06-28: qty 1

## 2013-06-28 MED ORDER — DOCUSATE SODIUM 100 MG PO CAPS
100.0000 mg | ORAL_CAPSULE | Freq: Two times a day (BID) | ORAL | Status: DC
  Administered 2013-06-28: 100 mg via ORAL
  Filled 2013-06-28 (×5): qty 1

## 2013-06-28 MED ORDER — VANCOMYCIN HCL IN DEXTROSE 1-5 GM/200ML-% IV SOLN
1000.0000 mg | Freq: Once | INTRAVENOUS | Status: AC
  Administered 2013-06-28: 1000 mg via INTRAVENOUS

## 2013-06-28 MED ORDER — DEXTROSE 5 % IV SOLN: 500.0000 mg | Freq: Once | INTRAVENOUS | Status: DC

## 2013-06-28 MED ORDER — DEXTROSE 5 % IV SOLN
1.0000 g | Freq: Three times a day (TID) | INTRAVENOUS | Status: DC
  Administered 2013-06-28: 1 g via INTRAVENOUS
  Filled 2013-06-28 (×2): qty 1

## 2013-06-28 MED ORDER — POLYETHYLENE GLYCOL 3350 17 G PO PACK
17.0000 g | PACK | Freq: Every day | ORAL | Status: DC | PRN
  Filled 2013-06-28: qty 1

## 2013-06-28 MED ORDER — LEVALBUTEROL HCL 0.63 MG/3ML IN NEBU: 0.6300 mg | INHALATION_SOLUTION | Freq: Four times a day (QID) | RESPIRATORY_TRACT | Status: DC | PRN

## 2013-06-28 MED ORDER — ACETAMINOPHEN 325 MG PO TABS
650.0000 mg | ORAL_TABLET | Freq: Once | ORAL | Status: AC
  Administered 2013-06-28: 650 mg via ORAL
  Filled 2013-06-28: qty 2

## 2013-06-28 NOTE — ED Provider Notes (Signed)
CSN: [id number]     Arrival date & time 06/28/13  1744 History   First MD Initiated Contact with Patient 06/28/13 1808     Chief Complaint  Patient presents with  . Shortness of Breath  . Chest Pain     (Consider location/radiation/quality/duration/timing/severity/associated sxs/prior Treatment) The history is provided by the patient. No language interpreter was used.  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 Patient recently had surgery performed last Monday for incision and drainage of abscess to the left knee. Patient reports that 2 days after the surgery he started to experience chest pain localize the left side of his chest described as a sharp intermittent pain lasting anywhere from 5-10 minutes with associated shortness of breath. Reported that he is short of breath with exertion and even at rest. Stated that he's been having a productive cough mainly of phlegm. Reported that he's been feeling nauseous-denied episodes of emesis. Stated that he has not had a bowel movement since Saturday when he normally has a bowel movement daily. Patient also reports he's been experiencing right shoulder pain described as "bricks" on his shoulder. Denied weakness, syncope, vomiting, sore throat, difficulty swallowing, abdominal pain, dizziness, blurred vision, sudden loss of vision, neck pain, neck stiffness, negative pain, knee swelling, active drainage or bleeding to the site. PCP Dr. Concepcion ElkAvbuere Surgeon Dr. Nance PearLawrence Earl [facility]ners [facility]  Past Medical History  Diagnosis Date  . Meningitis   . Encephalitis   . Hypertension   . Depression   . Stroke   . 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 Knee surgery    .  Joint replacement     History reviewed. No pertinent family history. History  Substance Use Topics  . Smoking status: Current Every Day Smoker -- 0.50 packs/day  . Smokeless tobacco: Never Used  . Alcohol Use: No    Review of Systems  Constitutional: Positive for fever. Negative for chills.  Respiratory: Positive for cough and shortness of breath. Negative for chest tightness.   Cardiovascular: Positive for chest pain.  Gastrointestinal: Positive for nausea and constipation. Negative for vomiting, abdominal pain, diarrhea and blood in stool.  Genitourinary: Negative for dysuria, hematuria and decreased urine volume.  Musculoskeletal: Negative for back pain, neck pain and neck stiffness.  Neurological: Positive for dizziness. Negative for weakness.  All other systems reviewed and are negative.     Allergies  Ciprofloxacin; Hydrocodone-acetaminophen; Ibuprofen; Propoxyphene n-acetaminophen; and Tramadol hcl  Home Medications   No current outpatient prescriptions on file. BP 127/90  Pulse 83  Temp(Src) 99.2 F (37.3 C) (Oral)  Resp 22  Ht 6\' 2"  (1.88 m)  Wt 165 lb 5.5 oz (75 kg)  BMI 21.22 kg/m2  SpO2 99% Physical Exam  Nursing note and vitals reviewed. Constitutional: He is oriented to person, place, and time. He appears well-developed and well-nourished. No distress.  HENT:  Head: Normocephalic and atraumatic.  Mouth/Throat: No oropharyngeal exudate.  Dry mucous membranes  Eyes: Conjunctivae and EOM are normal. Pupils are equal, round, and reactive to light. Right eye exhibits no discharge. Left eye exhibits no discharge.  Neck: Normal range of motion. Neck supple. No tracheal deviation present.  Negative neck stiffness Negative nuchal rigidity Negative cervical lymphadenopathy Negative meningeal  signs  Cardiovascular: Regular rhythm and normal heart sounds.  Tachycardia present.   Pulses:      Radial pulses are 2+ on the right side, and 2+ on the left side.        Dorsalis pedis pulses are 2+ on the right side, and 2+ on the left side.  Pulmonary/Chest: No respiratory distress. He has no wheezes. He has no rales.  Decreased breath sounds bilateral lower lobes Patient is able to speak in full sentences without difficulty Negative use of accessory muscles Negative stridor No sign of respiratory distress  Abdominal: Soft. Bowel sounds are normal. There is no tenderness. There is no guarding.  Musculoskeletal: Normal range of motion.  Full ROM to upper and lower extremities without difficulty noted, negative ataxia noted.  Sutures identified to the anterior aspect of the left knee with negative swelling, erythema, formation, lesions, sores, drainage, bleeding noted. Negative warmth upon palpation. Negative cellulitic findings. Negative effusion noted to the knee. Full range of motion to left knee.  Lymphadenopathy:    He has no cervical adenopathy.  Neurological: He is alert and oriented to person, place, and time. No cranial nerve deficit. He exhibits normal muscle tone. Coordination normal.  Cranial nerves III-XII grossly intact Strength 5+/5+ to upper and lower extremities bilaterally with resistance applied, equal distribution noted  Skin: Skin is warm and dry. No rash noted. He is not diaphoretic. No erythema.  Please the musculoskeletal  Psychiatric: He has a normal mood and affect. His behavior is normal. Thought content normal.    ED Course  Procedures (including critical care time)  7:48 PM This provider spoke with Dr. Rod Can - discussed case, history, labs, imaging, PE in great detail - patient to be admitted to the hospital for pneumonia and concern regarding history. Admitting physician recommended patient to be started on HCAP medications vanco and cefepime. Blood cultures and urine cultures have already been ordered.   Results for orders placed during the hospital encounter of 06/28/13  CBC      Result Value Ref Range   WBC 18.4 (*) 4.0  - 10.5 K/uL   RBC 4.49  4.22 - 5.81 MIL/uL   Hemoglobin 12.9 (*) 13.0 - 17.0 g/dL   HCT 16.1 (*) 09.6 - 04.5 %   MCV 83.1  78.0 - 100.0 fL   MCH 28.7  26.0 - 34.0 pg   MCHC 34.6  30.0 - 36.0 g/dL   RDW 40.9  81.1 - 91.4 %   Platelets 433 (*) 150 - 400 K/uL  BASIC METABOLIC PANEL      Result Value Ref Range   Sodium 135 (*) 137 - 147 mEq/L   Potassium 4.7  3.7 - 5.3 mEq/L   Chloride 96  96 - 112 mEq/L   CO2 25  19 - 32 mEq/L   Glucose, Bld 93  70 - 99 mg/dL   BUN 18  6 - 23 mg/dL   Creatinine, Ser 7.82  0.50 - 1.35 mg/dL   Calcium 9.1  8.4 - 95.6 mg/dL   GFR calc non Af Amer >90  >90 mL/min   GFR calc Af Amer >90  >90 mL/min  PRO B NATRIURETIC PEPTIDE      Result Value Ref Range   Pro B Natriuretic peptide (BNP) 9.5  0 - 125 pg/mL  HEPATIC FUNCTION PANEL      Result Value Ref Range   Total Protein 7.6  6.0 - 8.3 g/dL   Albumin 3.7  3.5 -  5.2 g/dL   AST 30  0 - 37 U/L   ALT 20  0 - 53 U/L   Alkaline Phosphatase 71  39 - 117 U/L   Total Bilirubin 1.0  0.3 - 1.2 mg/dL   Bilirubin, Direct <1.6<0.2  0.0 - 0.3 mg/dL   Indirect Bilirubin NOT CALCULATED  0.3 - 0.9 mg/dL  URINALYSIS, ROUTINE W REFLEX MICROSCOPIC      Result Value Ref Range   Color, Urine AMBER (*) YELLOW   APPearance CLEAR  CLEAR   Specific Gravity, Urine 1.027  1.005 - 1.030   pH 7.0  5.0 - 8.0   Glucose, UA NEGATIVE  NEGATIVE mg/dL   Hgb urine dipstick NEGATIVE  NEGATIVE   Bilirubin Urine NEGATIVE  NEGATIVE   Ketones, ur NEGATIVE  NEGATIVE mg/dL   Protein, ur 30 (*) NEGATIVE mg/dL   Urobilinogen, UA 1.0  0.0 - 1.0 mg/dL   Nitrite NEGATIVE  NEGATIVE   Leukocytes, UA NEGATIVE  NEGATIVE  URINE MICROSCOPIC-ADD ON      Result Value Ref Range   WBC, UA 0-2  <3 WBC/hpf   RBC / HPF 0-2  <3 RBC/hpf   Bacteria, UA RARE  RARE   Urine-Other SPERM PRESENT    PROCALCITONIN      Result Value Ref Range   Procalcitonin <0.10    LACTIC ACID, PLASMA      Result Value Ref Range   Lactic Acid, Venous 0.6  0.5 - 2.2 mmol/L   TROPONIN I      Result Value Ref Range   Troponin I <0.30  <0.30 ng/mL  CK TOTAL AND CKMB      Result Value Ref Range   Total CK 245 (*) 7 - 232 U/L   CK, MB 1.8  0.3 - 4.0 ng/mL   Relative Index 0.7  0.0 - 2.5  I-STAT TROPOININ, ED      Result Value Ref Range   Troponin i, poc 0.02  0.00 - 0.08 ng/mL   Comment 3             Labs Review Labs Reviewed  CBC - Abnormal; Notable for the following:    WBC 18.4 (*)    Hemoglobin 12.9 (*)    HCT 37.3 (*)    Platelets 433 (*)    All other components within normal limits  BASIC METABOLIC PANEL - Abnormal; Notable for the following:    Sodium 135 (*)    All other components within normal limits  URINALYSIS, ROUTINE W REFLEX MICROSCOPIC - Abnormal; Notable for the following:    Color, Urine AMBER (*)    Protein, ur 30 (*)    All other components within normal limits  CK TOTAL AND CKMB - Abnormal; Notable for the following:    Total CK 245 (*)    All other components within normal limits  URINE CULTURE  CULTURE, BLOOD (ROUTINE X 2)  CULTURE, BLOOD (ROUTINE X 2)  CULTURE, EXPECTORATED SPUTUM-ASSESSMENT  GRAM STAIN  MRSA PCR SCREENING  PRO B NATRIURETIC PEPTIDE  HEPATIC FUNCTION PANEL  URINE MICROSCOPIC-ADD ON  PROCALCITONIN  LACTIC ACID, PLASMA  TROPONIN I  HIV ANTIBODY (ROUTINE TESTING)  LEGIONELLA ANTIGEN, URINE  STREP PNEUMONIAE URINARY ANTIGEN  BASIC METABOLIC PANEL  CBC  TROPONIN I  TROPONIN I  CK TOTAL AND CKMB  CK TOTAL AND CKMB  I-STAT TROPOININ, ED   Imaging Review Dg Chest 2 View (if Patient Has Fever And/or Copd)  06/28/2013   CLINICAL DATA:  Shortness  of breath chest pain.  EXAM: CHEST - 2 VIEW  COMPARISON:  DG CHEST 2 VIEW dated 09/03/2009  FINDINGS: New airspace disease localize is to knee right lung and is felt to be mostly within the right middle lobe. Findings are suspicious for acute pneumonia. The heart size and mediastinal contours are within normal limits. No edema, pleural fluid, nodule or  pneumothorax. The visualized skeletal structures are unremarkable.  IMPRESSION: Right middle lobe infiltrate consistent with pneumonia.   Electronically Signed   By: Irish Lack M.D.   On: 06/28/2013 18:37   Ct Angio Chest Pe W/cm &/or Wo Cm  06/28/2013   CLINICAL DATA:  Chest pain and shortness of breath. Recent knee surgery.  EXAM: CT ANGIOGRAPHY CHEST WITH CONTRAST  TECHNIQUE: Multidetector CT imaging of the chest was performed using the standard protocol during bolus administration of intravenous contrast. Multiplanar CT image reconstructions and MIPs were obtained to evaluate the vascular anatomy.  CONTRAST:  OMNIPAQUE IOHEXOL 350 MG/ML SOLN  COMPARISON:  DG CHEST 2 VIEW dated 06/28/2013  FINDINGS: There is no evidence of pulmonary embolism. Acute appearing infiltrate present in the right middle lobe and posterior left lower lobe. Additional focus of nodule infiltrate is also present in the medial aspect of the right lower lobe. No edema or pleural fluid is identified. The heart size is normal. No pericardial fluid is seen. No masses or enlarged lymph nodes. The thoracic aorta is of normal caliber. No bony abnormalities are seen.  Review of the MIP images confirms the above findings.  IMPRESSION: 1. No evidence of pulmonary embolism. 2. Acute pneumonia predominately of the right middle lobe and left lower lobe. There also is focal infiltrate in the right lower lobe.   Electronically Signed   By: Irish Lack M.D.   On: 06/28/2013 20:50     EKG Interpretation   Date/Time:  Monday June 28 2013 17:58:01 EDT Ventricular Rate:  107 PR Interval:  158 QRS Duration: 89 QT Interval:  305 QTC Calculation: 407 R Axis:   50 Text Interpretation:  Sinus tachycardia Ventricular bigeminy RSR' in V1 or  V2, probably normal variant Inferior infarct, acute (LCx) Lateral leads  are also involved      MDM   Final diagnoses:  HCAP (healthcare-associated pneumonia)  Sepsis   Medications  0.9  %  sodium chloride infusion ( Intravenous New Bag/Given 06/28/13 2233)  enoxaparin (LOVENOX) injection 40 mg (40 mg Subcutaneous Given 06/28/13 2313)  ceFEPIme (MAXIPIME) 1 g in dextrose 5 % 50 mL IVPB (not administered)  levalbuterol (XOPENEX) nebulizer solution 0.63 mg (not administered)  guaiFENesin-dextromethorphan (ROBITUSSIN DM) 100-10 MG/5ML syrup 5 mL (not administered)  benzonatate (TESSALON) capsule 100 mg (100 mg Oral Given 06/28/13 2314)  docusate sodium (COLACE) capsule 100 mg (100 mg Oral Given 06/28/13 2315)  polyethylene glycol (MIRALAX / GLYCOLAX) packet 17 g (not administered)  vancomycin (VANCOCIN) IVPB 1000 mg/200 mL premix (not administered)  naproxen (NAPROSYN) tablet 250 mg (not administered)  sodium chloride 0.9 % bolus 1,000 mL (1,000 mLs Intravenous New Bag/Given 06/28/13 1842)  acetaminophen (TYLENOL) tablet 650 mg (650 mg Oral Given 06/28/13 1842)  iohexol (OMNIPAQUE) 350 MG/ML injection 100 mL (100 mLs Intravenous Contrast Given 06/28/13 2029)  vancomycin (VANCOCIN) IVPB 1000 mg/200 mL premix (1,000 mg Intravenous Given 06/28/13 2314)   Filed Vitals:   06/28/13 1834 06/28/13 2057 06/28/13 2200 06/29/13 0000  BP:   127/90   Pulse:   100 83  Temp: 102.7 F (39.3 C)  98.8  F (37.1 C) 99.2 F (37.3 C)  TempSrc: Rectal  Oral Oral  Resp:   11 22  Height:   6\' 2"  (1.88 m)   Weight:  167 lb 4 oz (75.864 kg) 165 lb 5.5 oz (75 kg)   SpO2:   98% 99%    Patient presenting to the ED with chest pain, shortness of breath, productive cough that started last week approximately 2 days after patient had surgery of I&D to abscess of the left knee. Patient reported that he's been feeling feverish. Reported cough is productive, phlegm. Reported chest pain localized left-sided the chest is intermittent lasting approximate 5-10 minutes without radiation. Patient has history of encephalitis meningitis with tracheostomy, history of paralysis. Alert and oriented. GCS 15. Heart rate and  rhythm normal. Decreased breath sounds to lower lungs bilaterally. Good lung expansion. Patient stable to speak in full sentences without difficulty. Negative use of accessory muscles-negative stridor. Cap refill less than 3 seconds. Radial and DP pulses 2+ bilaterally. Incisions identified to the anterior aspect of the left knee with negative swelling, erythema, active drainage, dehiscence, warmth upon palpation-negative findings of cellulitic infection. Negative findings of effusion. EKG noted sinus tachycardia with a heart rate of 170s per minute. First troponin negative elevation. CBC noted elevated white blood cell count 18.4. BMP noted mild low sodium of 135-kidneys functioning well. Hepatic function panel unremarkable. BMP negative findings. Chest x-ray identified right middle lobe infiltrate consistent with pneumonia. Blood cultures pending. Urine cultures pending.  Patient presenting to the ED with pneumonia after I&D of abscess to the left knee that was drained last week. Patient has an elevated WBC of 18.4 with fever and tachycardia. Due to patient's history of encephalitis, meningitis with paralysis - patient to be admitted. Fluids and tylenol given in ED setting. Patient to be started on IV antibiotics for HCAP. Discussed case with Dr. Isidoro Donning who is to admit the patient to [facility], as inpatient. Discussed plan of admission with patient who agreed to plan of care. Patient stable for transfer.   Raymon Mutton, PA-C 06/29/13 [phone]

## 2013-06-28 NOTE — ED Notes (Signed)
Pt holding in ED until RN is available to take pt in ICU stepdown.

## 2013-06-28 NOTE — H&P (Signed)
History and Physical       Hospital Admission Note Date: 06/28/2013  Patient name: [name] Medical record number: [id number] Date of birth: 1980/12/30 Age: 33 y.o. 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 The patient reported that he had left knee surgery done a week ago on 06/21/13 for incision and [facility] of the [facility]. Per patient 2 days after the surgery he started experiencing chest pain, left-sided, sharp, intermittent with associated shortness of breath. He states that shortness of breath is with exertion and at rest. Subsequently he started having productive cough and nausea, today he developed fevers, 102.7 in ED with a tachycardia. He states that chest pain at this time is resolved. He also reports constipation. ER workup showed troponin 0.02, BNP 9.5, WBC 18.4K sodium 135 Chest x-ray positive for right middle lobe infiltrates  Review of Systems:  Constitutional: ++ fever, chills, diaphoresis, poor appetite and fatigue.  HEENT: Denies photophobia, eye pain, redness, hearing loss, ear pain, congestion, sore throat, rhinorrhea, sneezing, mouth sores, trouble swallowing, neck pain, neck stiffness and tinnitus.   Respiratory: Please see history of present illness Cardiovascular: Please see history of present illness for chest pain, denies palpitations and leg swelling.  Gastrointestinal: Denies nausea, vomiting, abdominal pain, diarrhea, constipation, blood in stool and abdominal distention.  Genitourinary: Denies dysuria, urgency, frequency, hematuria, flank pain and  difficulty urinating.  Musculoskeletal: Denies myalgias, back pain, joint swelling, arthralgias and gait problem.  patient recently has left knee surgery, is currently ambulating on the left leg Skin: Denies pallor, rash and wound, has sutures in the left knee.  Neurological: Denies dizziness, seizures, syncope, weakness, light-headedness, numbness and headaches.  Hematological: Denies adenopathy. Easy bruising, personal or family bleeding history  Psychiatric/Behavioral: Denies suicidal ideation, mood changes, confusion, nervousness, sleep disturbance and agitation  Past Medical History: Past Medical History  Diagnosis Date  . Meningitis   . Encephalitis   . Hypertension   . Depression   . Stroke   . 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 Knee surgery    . Joint replacement      Medications: Prior to Admission medications   Medication Sig Start Date End Date Taking? Authorizing Provider  naproxen sodium (ANAPROX) 220 MG tablet Take 440 mg by mouth 2 (two) times daily as needed. For pain   Yes Historical Provider, MD    Allergies:   Allergies  Allergen Reactions  . Ciprofloxacin Other (See Comments)    Reaction=hypertension  . Hydrocodone-Acetaminophen Hives  . Ibuprofen Hives  . Propoxyphene N-Acetaminophen Hives  . Tramadol Hcl Hives    Social History: Patient reports that he quit smoking 3 weeks ago. He reports that he uses illicit drugs (Marijuana). He reports that he does not drink alcohol.  Family History: History reviewed. No pertinent family history.  Physical Exam: Blood pressure 130/85, pulse 102, temperature 102.7 F (39.3 C), temperature source Rectal, resp. rate 22, SpO2 96.00%. General: Alert, awake, oriented x3, in no acute distress. HEENT: normocephalic, atraumatic, anicteric sclera, pink conjunctiva,  pupils equal and reactive to light and accomodation,  oropharynx clear, dry mucosal membranes Neck: supple, no masses or lymphadenopathy, no goiter, no bruits  Heart: Tachycardia Regular rate and rhythm, without murmurs, rubs or gallops. Lungs: Decreased breath sounds bilateral lower lobes Abdomen: Soft, nontender, nondistended, positive bowel sounds, no masses. Extremities: No clubbing, cyanosis or edema with positive pedal pulses. Full range of movement in the upper and lower extremities. Sutures in the left knee, no swelling, erythema, drainage or any tenderness. No cellulitis noticed Neuro: Grossly intact, no focal neurological deficits, strength 5/5 upper and lower extremities bilaterally Psych: alert and oriented x 3, normal mood and affect Skin: no rashes or lesions, warm and dry   LABS on Admission:  Basic Metabolic Panel:  Recent Labs Lab 06/28/13 1815  NA 135*  K 4.7  CL 96  CO2 25  GLUCOSE 93  BUN 18  CREATININE 0.73  CALCIUM 9.1   Liver Function Tests:  Recent Labs Lab 06/28/13 1815  AST 30  ALT 20  ALKPHOS 71  BILITOT 1.0  PROT 7.6  ALBUMIN 3.7   No results found for this basename: LIPASE, AMYLASE,  in the last 168 hours No results found for this basename: AMMONIA,  in the last 168 hours CBC:  Recent Labs Lab 06/28/13 1815  WBC 18.4*  HGB 12.9*  HCT 37.3*  MCV 83.1  PLT 433*   Cardiac Enzymes: No results found for this basename: CKTOTAL, CKMB, CKMBINDEX, TROPONINI,  in the last 168 hours BNP: No components found with this basename: POCBNP,  CBG: No results found for this basename: GLUCAP,  in the last 168 hours   Radiological Exams on Admission: Dg Chest 2 View (if Patient Has Fever And/or Copd)  06/28/2013   CLINICAL DATA:  Shortness of breath chest pain.  EXAM: CHEST - 2 VIEW  COMPARISON:  DG CHEST 2 VIEW dated 09/03/2009  FINDINGS: New airspace disease localize is to knee right lung and is felt to be mostly within the right middle lobe. Findings are suspicious for acute pneumonia. The heart  size and mediastinal contours are within normal limits. No edema, pleural fluid, nodule or pneumothorax. The visualized skeletal structures are unremarkable.  IMPRESSION: Right middle lobe infiltrate consistent with pneumonia.   Electronically Signed   By: Irish Lack M.D.   On: 06/28/2013 18:37    Assessment/Plan Principal Problem:   SIRS (systemic inflammatory response syndrome): Likely due to HCAP, patient also had recent left knee surgery presenting with chest pain and shortness of breath, persistent tachycardia, rule out PE, leukocytosis with white count 18.4K, tachycardia, fever 102.7 -  Admit to step down, gentle hydration, obtain lactic acid and procalcitonin, legionella antigen, urine strep antigen, sputum culture, blood cultures - Placed on IV vancomycin and cefepime for HCAP, per patient he had been on antibiotics for the left knee abscess   Active Problems:   HCAP (healthcare-associated pneumonia): Has #1    Chest pain, atypical: Resolved - Completely resolved, patient has pneumonia in the right lung however he complains of chest pain left-sided - First set of troponin is negative, will obtain serial cardiac enzymes, CTA to rule out PE - EKG reviewed 107sinus tachycardia with Q waves in inferior leads, similar to the previous EKGs in 12/2012, bigeminy. If troponins positive or recurrent chest pain, will obtain 2-D echo for further workup    slight  Hyponatremia: Na135 - Somewhat dehydrated, placed on IV fluid hydration,   DVT prophylaxis:  LOVENOX  CODE STATUS:  I  discussed in detail with the patient and he opts for DO NOT RESUSCITATE status   Family Communication: Admission, patients condition and plan of care including tests being ordered have been discussed with the patient who indicates understanding and agree with the plan and Code Status   Further plan will depend as patient's clinical course evolves and further radiologic and laboratory data become available.    Time Spent on Admission: 1 hour  Amyria Komar K Rylin Saez M.Jenna Luo. [facility] 06/28/2013, 8:14 PM Pager: [phone]  If 7PM-7AM, please contact night-coverage [email or web address] Password [email or web address]  **Disclaimer: This note was dictated with voice recognition software. Similar sounding words can inadvertently be transcribed and this note may contain transcription errors which may not have been corrected upon publication of note.**

## 2013-06-28 NOTE — ED Notes (Addendum)
Pt states he [facility] Monday [facility]. Pt states states he began to have cp and sob since Wednesday. Pt states he has vertigo as well. Sob with exertion. Lungs clear.

## 2013-06-28 NOTE — Progress Notes (Signed)
ANTIBIOTIC CONSULT NOTE - INITIAL  Pharmacy Consult for vancomycin/cefepime Indication: rule out pneumonia  Allergies  Allergen Reactions  . Ciprofloxacin Other (See Comments)    Reaction=hypertension  . Hydrocodone-Acetaminophen Hives  . Ibuprofen Hives  . Propoxyphene N-Acetaminophen Hives  . Tramadol Hcl Hives    Patient Measurements:   Adjusted Body Weight:   Vital Signs: Temp: 102.7 F (39.3 C) (04/13 1834) Temp src: Rectal (04/13 1834) BP: 130/85 mmHg (04/13 1800) Pulse Rate: 102 (04/13 1758) Intake/Output from previous day:   Intake/Output from this shift:    Labs:  Recent Labs  06/28/13 1815  WBC 18.4*  HGB 12.9*  PLT 433*  CREATININE 0.73   The CrCl is unknown because both a height and weight (above a minimum accepted value) are required for this calculation. No results found for this basename: VANCOTROUGH, VANCOPEAK, VANCORANDOM, GENTTROUGH, GENTPEAK, GENTRANDOM, TOBRATROUGH, TOBRAPEAK, TOBRARND, AMIKACINPEAK, AMIKACINTROU, AMIKACIN,  in the last 72 hours   Microbiology: No results found for this or any previous visit (from the past 720 hour(s)).  Medical History: Past Medical History  Diagnosis Date  . Meningitis   . Encephalitis   . Hypertension   . Depression   . Stroke   . 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  CXR is consistent with RML infiltrate. Patient [facility].  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    4/13 >> vancomycin  >> 4/13 >> cefepime  >>    Tmax: 102.7 WBCs:18.4 Renal: Scr  = 0.73 for estimated CrCl > 100 ml/min  4/13 blood: pending 4/13 urine: pending / sputum: ordered 4/13: s. pneumo Ag: 4/13: Legionella Ag:   Goal of Therapy:  Vancomycin trough level 15-20 mcg/ml  Plan:   Vancomycin 1gm IV  q8h  Monitor renal function and Check vancomycin trough at steady state  Cefepime 1gm IV q8h  Juliette Alcideustin Zeigler, PharmD, BCPS.   Pager: [phone]  06/28/2013,8:18 PM

## 2013-06-29 DIAGNOSIS — F3289 Other specified depressive episodes: Secondary | ICD-10-CM

## 2013-06-29 DIAGNOSIS — F329 Major depressive disorder, single episode, unspecified: Secondary | ICD-10-CM

## 2013-06-29 LAB — BASIC METABOLIC PANEL WITH GFR
BUN: 11 mg/dL (ref 6–23)
CO2: 24 meq/L (ref 19–32)
Calcium: 8.5 mg/dL (ref 8.4–10.5)
Chloride: 101 meq/L (ref 96–112)
Creatinine, Ser: 0.71 mg/dL (ref 0.50–1.35)
GFR calc Af Amer: >90 mL/min
GFR calc non Af Amer: >90 mL/min
Glucose, Bld: 121 mg/dL — ABNORMAL HIGH (ref 70–99)
Potassium: 3.6 meq/L — ABNORMAL LOW (ref 3.7–5.3)
Sodium: 136 meq/L — ABNORMAL LOW (ref 137–147)

## 2013-06-29 LAB — CBC
HCT: 34.9 % — ABNORMAL LOW (ref 39.0–52.0)
Hemoglobin: 11.9 g/dL — ABNORMAL LOW (ref 13.0–17.0)
MCH: 28.3 pg (ref 26.0–34.0)
MCHC: 34.1 g/dL (ref 30.0–36.0)
MCV: 83.1 fL (ref 78.0–100.0)
PLATELETS: 405 10*3/uL — AB (ref 150–400)
RBC: 4.2 MIL/uL — AB (ref 4.22–5.81)
RDW: 13.1 % (ref 11.5–15.5)
WBC: 17.5 10*3/uL — ABNORMAL HIGH (ref 4.0–10.5)

## 2013-06-29 LAB — URINE CULTURE
COLONY COUNT: NO GROWTH
Culture: NO GROWTH
Special Requests: NORMAL

## 2013-06-29 LAB — LEGIONELLA ANTIGEN, URINE: Legionella Antigen, Urine: NEGATIVE

## 2013-06-29 LAB — CK TOTAL AND CKMB (NOT AT ARMC)
CK TOTAL: 201 U/L (ref 7–232)
CK TOTAL: 245 U/L — AB (ref 7–232)
CK, MB: 1.8 ng/mL (ref 0.3–4.0)
CK, MB: 1.5 ng/mL (ref 0.3–4.0)
CK, MB: 1.8 ng/mL (ref 0.3–4.0)
RELATIVE INDEX: 0.7 (ref 0.0–2.5)
Relative Index: 0.7 (ref 0.0–2.5)
Relative Index: 0.9 (ref 0.0–2.5)
Total CK: 196 U/L (ref 7–232)

## 2013-06-29 LAB — TROPONIN I
Troponin I: <0.3 ng/mL
Troponin I: <0.3 ng/mL
Troponin I: <0.3 ng/mL (ref <0.30)

## 2013-06-29 LAB — MRSA PCR SCREENING: MRSA by PCR: NEGATIVE

## 2013-06-29 LAB — STREP PNEUMONIAE URINARY ANTIGEN: Strep Pneumo Urinary Antigen: NEGATIVE

## 2013-06-29 LAB — HIV ANTIBODY (ROUTINE TESTING W REFLEX): HIV 1&2 Ab, 4th Generation: NONREACTIVE

## 2013-06-29 MED ORDER — ADULT MULTIVITAMIN W/MINERALS CH
1.0000 | ORAL_TABLET | Freq: Every day | ORAL | Status: DC
  Administered 2013-06-29: 1 via ORAL
  Filled 2013-06-29 (×2): qty 1

## 2013-06-29 MED ORDER — NICOTINE 14 MG/24HR TD PT24
14.0000 mg | MEDICATED_PATCH | Freq: Every day | TRANSDERMAL | Status: DC
  Administered 2013-06-29: 14 mg via TRANSDERMAL
  Filled 2013-06-29 (×2): qty 1

## 2013-06-29 NOTE — Progress Notes (Signed)
CARE MANAGEMENT NOTE 06/29/2013  Patient:  [name],[name]   Account Number:  [id number]  Date Initiated:  06/29/2013  Documentation initiated by:  DAVIS,RHONDA  Subjective/Objective Assessment:   pna with sepsis, temp,hypotensive fld volume given in ed     Action/Plan:   home when stable   Anticipated DC Date:  07/02/2013   Anticipated DC Plan:  HOME/SELF CARE  In-house referral  NA      DC Planning Services  NA      [facility]   Choice offered to / List presented to:  NA   DME arranged  NA      DME agency  NA     HH arranged  NA      HH agency  NA   Status of service:  In process, will continue to follow Medicare Important Message given?  NA - LOS <3 / Initial given by admissions (If response is "NO", the following Medicare IM given date fields will be blank) Date Medicare IM given:   Date Additional Medicare IM given:    Discharge Disposition:    Per UR Regulation:  Reviewed for med. necessity/level of care/duration of stay  If discussed at Long Length of Stay Meetings, dates discussed:    Comments:  [id number]/Rhonda Stark JockDavis, RN, BSN, [place] [phone] Chart Reviewed for discharge and hospital needs. Discharge needs at time of review: None present will follow for needs. Review of patient progress due on [id number].

## 2013-06-29 NOTE — ED Provider Notes (Signed)
Medical screening examination/treatment/procedure(s) were performed by non-physician practitioner and as supervising physician I was immediately available for consultation/collaboration.   EKG Interpretation   Date/Time:  Monday June 28 2013 17:58:01 EDT Ventricular Rate:  107 PR Interval:  158 QRS Duration: 89 QT Interval:  305 QTC Calculation: 407 R Axis:   50 Text Interpretation:  Sinus tachycardia Ventricular bigeminy RSR' in V1 or  V2, probably normal variant Inferior infarct, acute (LCx) Lateral leads  are also involved        Gwyneth SproutWhitney Mateusz Neilan, MD 06/29/13 2356

## 2013-06-29 NOTE — Evaluation (Signed)
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  Pt able to ambulate in hallway with no report of SOB or fatigue, slightly HR elevated.  Pt has gait abnormalities however reported to be at baseline and had no LOB during activities.  Pt hopes to discharge tomorrow and reports good family support.  All education has been completed and the patient has no further questions.  See below for any follow-up PT or equipment needs.  PT is signing off.  Thank you for this referral.    Follow Up Recommendations No PT follow up    Equipment Recommendations  None recommended by PT    Recommendations for Other Services       Precautions / Restrictions Precautions Precautions: None Restrictions Weight Bearing Restrictions: No      Mobility  Bed Mobility Overal bed mobility: Needs Assistance Bed Mobility: Supine to Sit;Sit to Supine     Supine to sit: Supervision Sit to supine: Supervision   General bed mobility comments: verbal cues for safety  Transfers Overall transfer level: Needs assistance Equipment used: None Transfers: Sit to/from Stand Sit to Stand: Supervision         General transfer comment: verbal cues for safety; pt able to perform sit to stand multiple times throughout examination  Ambulation/Gait Ambulation/Gait assistance: Supervision Ambulation Distance (Feet): 200 Feet Assistive device: None Gait Pattern/deviations: Step-through pattern;Decreased step length - right;Decreased stance time - left     General  Gait Details: verbal cues for safety; pt reports at baseline gait pattern, did not report SOB or fatigue  Stairs            Wheelchair Mobility    Modified Rankin (Stroke Patients Only)       Balance Overall balance assessment: No apparent balance deficits (not formally assessed) Sitting-balance support: No upper extremity supported;Feet supported Sitting balance-Leahy Scale: Normal Sitting balance - Comments: pt able to bend forward and put on both shoes without LOB   Standing balance support: No upper extremity supported;During functional activity Standing balance-Leahy Scale: Good Standing balance comment: had no LOB or unsteadiness during gait, however L side weaker due to hx of CVA                             Pertinent Vitals/Pain HR monitored during ambulation, up to 112 bpm.  Activity to tolerance.  No complaints of pain.    Home Living Family/patient expects to be discharged to:: Private residence Living Arrangements: Alone Available Help at Discharge: Family;Neighbor (reports that neighbor typically assists him daily, grandmother, father, and siblings also available to help) Type of Home: Apartment Home Access: Stairs to enter Entrance Stairs-Rails: Right Entrance Stairs-Number of Steps: 2 flights Home Layout: One level Home Equipment: Cane - single point      Prior Function Level of Independence: Independent               Hand Dominance        Extremity/Trunk Assessment   Upper Extremity Assessment: LUE deficits/detail  LUE Deficits / Details: weaker than R side due to hx of CVA   Lower Extremity Assessment: LLE deficits/detail   LLE Deficits / Details: weaker than R side due to hx of CVA  Cervical / Trunk Assessment: Normal  Communication   Communication: No difficulties  Cognition Arousal/Alertness: Awake/alert Behavior During Therapy: WFL for tasks assessed/performed Overall Cognitive Status: Within Functional  Limits for tasks assessed                      General Comments      Exercises        Assessment/Plan    PT Assessment Patent does not need any further PT services  PT Diagnosis     PT Problem List    PT Treatment Interventions     PT Goals (Current goals can be found in the Care Plan section) Acute Rehab PT Goals PT Goal Formulation: No goals set, d/c therapy    Frequency     Barriers to discharge        Co-evaluation               End of Session Equipment Utilized During Treatment: Gait belt Activity Tolerance: Patient tolerated treatment well Patient left: in bed;with call bell/phone within reach Nurse Communication: Mobility status         Time: [id number] PT Time Calculation (min): 15 min   Charges:   PT Evaluation $Initial PT Evaluation Tier I: 1 Procedure PT Treatments $Gait Training: 8-22 mins   PT G CodesBufford [name]:          [name] 06/29/2013, 11:25 AM Gary LopeLaura Carilyn [name] SPT 06/29/2013

## 2013-06-29 NOTE — Progress Notes (Signed)
Nutrition Brief Note  Patient identified on the Malnutrition Screening Tool (MST) Report  Wt Readings from Last 15 Encounters:  06/28/13 165 lb 5.5 oz (75 kg)  05/22/11 160 lb (72.576 kg)  07/28/07 165 lb 2.1 oz (74.903 kg)  06/10/07 164 lb 8 oz (74.617 kg)    Body mass index is 21.22 kg/(m^2). Patient meets criteria for normal weight based on current BMI.   Current diet order is heart healthy, patient is consuming approximately 100% of meals at this time. Labs and medications reviewed. Potassium low. Admitted with chest pain and shortness of breath with fever x 2 days. 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  Met with pt who reports excellent appetite at home, 5 meals/day. Sample breakfast would be pancakes, eggs, and sausage. Pt said he loves to eat. Reports weight goes up and down however usual weight is 166 pounds. Walks daily for exercise. C/o some nausea from CT scan but otherwise no complaints of nausea. Will order daily multivitamin.   No further nutrition interventions warranted at this time. If nutrition issues arise, please consult RD.   Mikey College MS, [place], [place] Pager [phone] After Hours Pager

## 2013-06-29 NOTE — Evaluation (Signed)
I have reviewed this note and agree with all findings. Kati Georgene Kopper, PT, DPT Pager: [phone]   

## 2013-06-29 NOTE — Progress Notes (Signed)
[facility] PROGRESS NOTE  Gary MornRamon A [name] ZOX:[id number] DOB: 20-Oct-1980 DOA: 06/28/2013 PCP: Dorrene GermanAVBUERE,EDWIN A, MD  Assessment/Plan: HCAP -Continue vanc/cefepime pending cx data. -Flu PCR pending. -Clinically improved today.  SIRS -Parameters improved with treatment of PNA.  Chest Pain -Likely related to PNA. -Resolved. -Troponins all negative.  Code Status: Full Code Family Communication: Patient only  Disposition Plan: Transfer to floor; likely home in 24-48 hours if continued clinical improvement.   Consultants:  None   Antibiotics:  None   Subjective: Feels better. No SOB.  Objective: Filed Vitals:   06/28/13 2200 06/29/13 0000 06/29/13 0400 06/29/13 0800  BP: 127/90  120/68 124/68  Pulse: 100 83 91 90  Temp: 98.8 F (37.1 C) 99.2 F (37.3 C) 98.6 F (37 C) 98.1 F (36.7 C)  TempSrc: Oral Oral Oral Oral  Resp: 11 22 25 24   Height: 6\' 2"  (1.88 m)     Weight: 75 kg (165 lb 5.5 oz)     SpO2: 98% 99% 100% 100%    Intake/Output Summary (Last 24 hours) at 06/29/13 1526 Last data filed at 06/29/13 1507  Gross per 24 hour  Intake   2635 ml  Output   1050 ml  Net   1585 ml   Filed Weights   06/28/13 2057 06/28/13 2200  Weight: 75.864 kg (167 lb 4 oz) 75 kg (165 lb 5.5 oz)    Exam:   General:  AA Ox3  Cardiovascular: RRR  Respiratory: CTA B, decreased BD nases  Abdomen: S/NT/ND/+BS  Extremities: no C/C/E   Neurologic:  Non-focal  Data Reviewed: Basic Metabolic Panel:  Recent Labs Lab 06/28/13 1815 06/29/13 0515  NA 135* 136*  K 4.7 3.6*  CL 96 101  CO2 25 24  GLUCOSE 93 121*  BUN 18 11  CREATININE 0.73 0.71  CALCIUM 9.1 8.5   Liver Function Tests:  Recent Labs Lab 06/28/13 1815  AST 30  ALT 20  ALKPHOS 71  BILITOT 1.0  PROT 7.6  ALBUMIN 3.7   No results found for this basename: LIPASE, AMYLASE,  in the last 168 hours No results found for this basename: AMMONIA,  in the last 168 hours CBC:  Recent  Labs Lab 06/28/13 1815 06/29/13 0515  WBC 18.4* 17.5*  HGB 12.9* 11.9*  HCT 37.3* 34.9*  MCV 83.1 83.1  PLT 433* 405*   Cardiac Enzymes:  Recent Labs Lab 06/28/13 2338 06/29/13 0515 06/29/13 1130  CKTOTAL 245* 201 196  CKMB 1.8 1.5 1.8  TROPONINI <0.30 <0.30 <0.30   BNP (last 3 results)  Recent Labs  06/28/13 1814  PROBNP 9.5   CBG: No results found for this basename: GLUCAP,  in the last 168 hours  Recent Results (from the past 240 hour(s))  MRSA PCR SCREENING     Status: None   Collection Time    06/29/13 12:46 AM      Result Value Ref Range Status   MRSA by PCR NEGATIVE  NEGATIVE Final   Comment:            The GeneXpert MRSA Assay (FDA     approved for NASAL specimens     only), is one component of a     comprehensive MRSA colonization     surveillance program. It is not     intended to diagnose MRSA     infection nor to guide or     monitor treatment for     MRSA infections.     Studies:  Dg Chest 2 View (if Patient Has Fever And/or Copd)  06/28/2013   CLINICAL DATA:  Shortness of breath chest pain.  EXAM: CHEST - 2 VIEW  COMPARISON:  DG CHEST 2 VIEW dated 09/03/2009  FINDINGS: New airspace disease localize is to knee right lung and is felt to be mostly within the right middle lobe. Findings are suspicious for acute pneumonia. The heart size and mediastinal contours are within normal limits. No edema, pleural fluid, nodule or pneumothorax. The visualized skeletal structures are unremarkable.  IMPRESSION: Right middle lobe infiltrate consistent with pneumonia.   Electronically Signed   By: Irish LackGlenn  Yamagata M.D.   On: 06/28/2013 18:37   Ct Angio Chest Pe W/cm &/or Wo Cm  06/28/2013   CLINICAL DATA:  Chest pain and shortness of breath. Recent knee surgery.  EXAM: CT ANGIOGRAPHY CHEST WITH CONTRAST  TECHNIQUE: Multidetector CT imaging of the chest was performed using the standard protocol during bolus administration of intravenous contrast. Multiplanar CT image  reconstructions and MIPs were obtained to evaluate the vascular anatomy.  CONTRAST:  100mL OMNIPAQUE IOHEXOL 350 MG/ML SOLN  COMPARISON:  DG CHEST 2 VIEW dated 06/28/2013  FINDINGS: There is no evidence of pulmonary embolism. Acute appearing infiltrate present in the right middle lobe and posterior left lower lobe. Additional focus of nodule infiltrate is also present in the medial aspect of the right lower lobe. No edema or pleural fluid is identified. The heart size is normal. No pericardial fluid is seen. No masses or enlarged lymph nodes. The thoracic aorta is of normal caliber. No bony abnormalities are seen.  Review of the MIP images confirms the above findings.  IMPRESSION: 1. No evidence of pulmonary embolism. 2. Acute pneumonia predominately of the right middle lobe and left lower lobe. There also is focal infiltrate in the right lower lobe.   Electronically Signed   By: Irish LackGlenn  Yamagata M.D.   On: 06/28/2013 20:50    Scheduled Meds: . ceFEPime (MAXIPIME) IV  1 g Intravenous 3 times per day  . docusate sodium  100 mg Oral BID  . enoxaparin (LOVENOX) injection  40 mg Subcutaneous QHS  . multivitamin with minerals  1 tablet Oral Daily  . nicotine  14 mg Transdermal Daily  . vancomycin  1,000 mg Intravenous Q8H   Continuous Infusions: . sodium chloride 100 mL/hr at 06/28/13 2233    Principal Problem:   SIRS (systemic inflammatory response syndrome) Active Problems:   HCAP (healthcare-associated pneumonia)   Chest pain, atypical   Hyponatremia    Time spent: 35 minutes. Greater than 50% of this time was spent in direct contact with the patient coordinating care.    Henderson Cloudstela Y Hernandez Acosta  [facility] Pager [phone]  If 7PM-7AM, please contact night-coverage at [email or web address], password [facility] 06/29/2013, 3:26 PM  LOS: 1 day

## 2013-06-30 DIAGNOSIS — F17200 Nicotine dependence, unspecified, uncomplicated: Secondary | ICD-10-CM

## 2013-06-30 LAB — RESPIRATORY VIRUS PANEL
Adenovirus: NOT DETECTED
INFLUENZA A H1: NOT DETECTED
INFLUENZA A H3: NOT DETECTED
INFLUENZA A: NOT DETECTED
INFLUENZA B 1: NOT DETECTED
Metapneumovirus: NOT DETECTED
PARAINFLUENZA 1 A: NOT DETECTED
Parainfluenza 2: NOT DETECTED
Parainfluenza 3: NOT DETECTED
RESPIRATORY SYNCYTIAL VIRUS A: NOT DETECTED
RESPIRATORY SYNCYTIAL VIRUS B: NOT DETECTED
Rhinovirus: NOT DETECTED

## 2013-06-30 LAB — CBC
HEMATOCRIT: 35.2 % — AB (ref 39.0–52.0)
HEMOGLOBIN: 12 g/dL — AB (ref 13.0–17.0)
MCH: 28.6 pg (ref 26.0–34.0)
MCHC: 34.1 g/dL (ref 30.0–36.0)
MCV: 83.8 fL (ref 78.0–100.0)
Platelets: 475 10*3/uL — ABNORMAL HIGH (ref 150–400)
RBC: 4.2 MIL/uL — AB (ref 4.22–5.81)
RDW: 13.1 % (ref 11.5–15.5)
WBC: 13.1 10*3/uL — AB (ref 4.0–10.5)

## 2013-06-30 LAB — BASIC METABOLIC PANEL
BUN: 9 mg/dL (ref 6–23)
CHLORIDE: 105 meq/L (ref 96–112)
CO2: 24 mEq/L (ref 19–32)
Calcium: 8.7 mg/dL (ref 8.4–10.5)
Creatinine, Ser: 0.7 mg/dL (ref 0.50–1.35)
Glucose, Bld: 103 mg/dL — ABNORMAL HIGH (ref 70–99)
POTASSIUM: 4.3 meq/L (ref 3.7–5.3)
SODIUM: 140 meq/L (ref 137–147)

## 2013-06-30 LAB — PROCALCITONIN

## 2013-06-30 MED ORDER — NICOTINE 14 MG/24HR TD PT24: 14.0000 mg | MEDICATED_PATCH | Freq: Every day | TRANSDERMAL | Status: DC

## 2013-06-30 MED ORDER — LEVOFLOXACIN 750 MG PO TABS: 750.0000 mg | ORAL_TABLET | Freq: Every day | ORAL | Status: AC

## 2013-06-30 NOTE — Discharge Summary (Signed)
Physician Discharge Summary  Gary MawRamon A Windle ZOX:[id number] DOB: 1980/08/14 DOA: 06/28/2013  PCP: Dorrene GermanAVBUERE,EDWIN A, MD  Admit date: 06/28/2013 Discharge date: 06/30/2013  Time spent: 25 minutes  Recommendations for Outpatient Follow-up:  1. New medications: Levaquin 750 mg by mouth x5 days for total of 7 days of therapy 2. New medications: Nicotine patch 3. Patient is to follow up with his primary care physician at [facility] in the next few weeks  Discharge Diagnoses:  Principal Problem:   SIRS (systemic inflammatory response syndrome) Active Problems:   HCAP (healthcare-associated pneumonia)   Chest pain, atypical   Hyponatremia   Discharge Condition: Improved, being discharged home  Diet recommendation: Regular  Filed Weights   06/28/13 2057 06/28/13 2200 06/29/13 1200  Weight: 75.864 kg (167 lb 4 oz) 75 kg (165 lb 5.5 oz) 74.7 kg (164 lb 10.9 oz)    History of present illness:  33 year old African American male with past medical history hypertension and CVA who presented to the emergency room on 4/13 with complaints of shortness of breath and fever x2 days. Patient is status post a [facility] for drainage of an abscess one week prior. He was noted in the emergency room to have a white count of 18,000, temperature 102  Hospital Course:  Principal Problem:   SIRS (systemic inflammatory response syndrome): Given patient's temperature, white count on admission, this is consistent with SIRS secondary to pneumonia. See below. Blood cultures remained negative. Active Problems:   HCAP (healthcare-associated pneumonia): Given recent surgery, treated as healthcare associated pneumonia. On IV cefepime and vancomycin. Patient's white blood cell count improved and by 4/15 was down to 13.5. At this point, patient was feeling much better and absolutely insistent that he be discharged otherwise would leave AGAINST MEDICAL ADVICE. Given that his oxygen saturations  were stable on room air, patient was discharged on 5 more days of Levaquin for 7 days of therapy. Will follow up with his PCP.    Chest pain, atypical: Troponins negative. Felt to be secondary to pneumonia. CT angiogram done in emergency room confirmed no evidence of pulmonary embolus, but did note pneumonia.    Hyponatremia: Mild on admission, 135. Improved with hydration.  Procedures:  None  Consultations:  None  Discharge Exam: Filed Vitals:   06/30/13 0554  BP: 111/71  Pulse: 73  Temp: 98.4 F (36.9 C)  Resp: 20    General: Alert and oriented x3, no acute distress Cardiovascular: Regular rate and rhythm, S1-S2 Respiratory: Clear to auscultation bilaterally  Discharge Instructions You were cared for by a hospitalist during your hospital stay. If you have any questions about your discharge medications or the care you received while you were in the hospital after you are discharged, you can call the unit and asked to speak with the hospitalist on call if the hospitalist that took care of you is not available. Once you are discharged, your primary care physician will handle any further medical issues. Please note that NO REFILLS for any discharge medications will be authorized once you are discharged, as it is imperative that you return to your primary care physician (or establish a relationship with a primary care physician if you do not have one) for your aftercare needs so that they can reassess your need for medications and monitor your lab values.     Medication List         levofloxacin 750 MG tablet  Commonly known as:  LEVAQUIN  Take 1 tablet (  750 mg total) by mouth daily.     naproxen sodium 220 MG tablet  Commonly known as:  ANAPROX  Take 440 mg by mouth 2 (two) times daily as needed. For pain     nicotine 14 mg/24hr patch  Commonly known as:  NICODERM CQ - dosed in mg/24 hours  Place 1 patch (14 mg total) onto the skin daily.       Allergies  Allergen  Reactions  . Ciprofloxacin Other (See Comments)    Reaction=hypertension  . Hydrocodone-Acetaminophen Hives  . Ibuprofen Hives  . Propoxyphene N-Acetaminophen Hives  . Tramadol Hcl Hives       Follow-up Information   Follow up with AVBUERE,EDWIN A, MD. Schedule an appointment as soon as possible for a visit in 2 weeks.   Specialty:  Internal Medicine   Contact information:   [address] [place] [address] [phone]        The results of significant diagnostics from this hospitalization (including imaging, microbiology, ancillary and laboratory) are listed below for reference.    Significant Diagnostic Studies: Dg Chest 2 View (if Patient Has Fever And/or Copd)  06/28/2013   CLINICAL DATA:  Shortness of breath chest pain.  EXAM: CHEST - 2 VIEW  COMPARISON:  DG CHEST 2 VIEW dated 09/03/2009  FINDINGS: New airspace disease localize is to knee right lung and is felt to be mostly within the right middle lobe. Findings are suspicious for acute pneumonia. The heart size and mediastinal contours are within normal limits. No edema, pleural fluid, nodule or pneumothorax. The visualized skeletal structures are unremarkable.  IMPRESSION: Right middle lobe infiltrate consistent with pneumonia.   Electronically Signed   By: Irish Lack M.D.   On: 06/28/2013 18:37   Ct Angio Chest Pe W/cm &/or Wo Cm  06/28/2013   CLINICAL DATA:  Chest pain and shortness of breath. Recent knee surgery.  EXAM: CT ANGIOGRAPHY CHEST WITH CONTRAST  TECHNIQUE: Multidetector CT imaging of the chest was performed using the standard protocol during bolus administration of intravenous contrast. Multiplanar CT image reconstructions and MIPs were obtained to evaluate the vascular anatomy.  CONTRAST:  OMNIPAQUE IOHEXOL 350 MG/ML SOLN  COMPARISON:  DG CHEST 2 VIEW dated 06/28/2013  FINDINGS: There is no evidence of pulmonary embolism. Acute appearing infiltrate present in the right middle lobe and posterior left  lower lobe. Additional focus of nodule infiltrate is also present in the medial aspect of the right lower lobe. No edema or pleural fluid is identified. The heart size is normal. No pericardial fluid is seen. No masses or enlarged lymph nodes. The thoracic aorta is of normal caliber. No bony abnormalities are seen.  Review of the MIP images confirms the above findings.  IMPRESSION: 1. No evidence of pulmonary embolism. 2. Acute pneumonia predominately of the right middle lobe and left lower lobe. There also is focal infiltrate in the right lower lobe.   Electronically Signed   By: Irish Lack M.D.   On: 06/28/2013 20:50    Microbiology: Recent Results (from the past 240 hour(s))  URINE CULTURE     Status: None   Collection Time    06/28/13  7:09 PM      Result Value Ref Range Status   Specimen Description URINE, CLEAN CATCH   Final   Special Requests Normal   Final   Culture  Setup Time     Final   Value: 06/29/2013 02:02     Performed at [facility]  Colony Count     Final   Value: NO GROWTH     Performed at [facility]   Culture     Final   Value: NO GROWTH     Performed at [facility]   Report Status 06/29/2013 FINAL   Final  CULTURE, BLOOD (ROUTINE X 2)     Status: None   Collection Time    06/28/13  8:10 PM      Result Value Ref Range Status   Specimen Description BLOOD L ARM   Final   Special Requests BOTTLES DRAWN AEROBIC AND ANAEROBIC EACH   Final   Culture  Setup Time     Final   Value: 06/29/2013 01:08     Performed at [facility]   Culture     Final   Value:        BLOOD CULTURE RECEIVED NO GROWTH TO DATE CULTURE WILL BE HELD FOR 5 DAYS BEFORE ISSUING A FINAL NEGATIVE REPORT     Performed at [facility]   Report Status PENDING   Incomplete  CULTURE, BLOOD (ROUTINE X 2)     Status: None   Collection Time    06/28/13  8:15 PM      Result Value Ref Range Status   Specimen Description BLOOD RIGHT ANTECUBITAL   Final    Special Requests BOTTLES DRAWN AEROBIC AND ANAEROBIC 3 MLS EACH   Final   Culture  Setup Time     Final   Value: 06/29/2013 01:08     Performed at [facility]   Culture     Final   Value:        BLOOD CULTURE RECEIVED NO GROWTH TO DATE CULTURE WILL BE HELD FOR 5 DAYS BEFORE ISSUING A FINAL NEGATIVE REPORT     Performed at [facility]   Report Status PENDING   Incomplete  MRSA PCR SCREENING     Status: None   Collection Time    06/29/13 12:46 AM      Result Value Ref Range Status   MRSA by PCR NEGATIVE  NEGATIVE Final   Comment:            The GeneXpert MRSA Assay (FDA     approved for NASAL specimens     only), is one component of a     comprehensive MRSA colonization     surveillance program. It is not     intended to diagnose MRSA     infection nor to guide or     monitor treatment for     MRSA infections.     Labs: Basic Metabolic Panel:  Recent Labs Lab 06/28/13 1815 06/29/13 0515 06/30/13 0320  NA 135* 136* 140  K 4.7 3.6* 4.3  CL 96 101 105  CO2 25 24 24   GLUCOSE 93 121* 103*  BUN 18 11 9   CREATININE 0.73 0.71 0.70  CALCIUM 9.1 8.5 8.7   Liver Function Tests:  Recent Labs Lab 06/28/13 1815  AST 30  ALT 20  ALKPHOS 71  BILITOT 1.0  PROT 7.6  ALBUMIN 3.7   No results found for this basename: LIPASE, AMYLASE,  in the last 168 hours No results found for this basename: AMMONIA,  in the last 168 hours CBC:  Recent Labs Lab 06/28/13 1815 06/29/13 0515 06/30/13 0320  WBC 18.4* 17.5* 13.1*  HGB 12.9* 11.9* 12.0*  HCT 37.3* 34.9* 35.2*  MCV 83.1 83.1 83.8  PLT 433*  405* 475*   Cardiac Enzymes:  Recent Labs Lab 06/28/13 2338 06/29/13 0515 06/29/13 1130  CKTOTAL 245* 201 196  CKMB 1.8 1.5 1.8  TROPONINI <0.30 <0.30 <0.30   BNP: BNP (last 3 results)  Recent Labs  06/28/13 1814  PROBNP 9.5   CBG: No results found for this basename: GLUCAP,  in the last 168 hours     Signed:  Hollice EspySendil K Cruise Baumgardner  [facility] 06/30/2013, 4:33 PM

## 2013-06-30 NOTE — Progress Notes (Signed)
Patient received discharge instructions and verbalized understanding. Patient follow up appointments and medications discussed. Patient prescriptions called into the pharmacy. Patient to ambulate downstairs to be discharged home.

## 2013-06-30 NOTE — Discharge Instructions (Signed)

## 2013-07-05 LAB — CULTURE, BLOOD (ROUTINE X 2)
CULTURE: NO GROWTH
Culture: NO GROWTH

## 2013-07-30 ENCOUNTER — Emergency Department (INDEPENDENT_AMBULATORY_CARE_PROVIDER_SITE_OTHER)
Admission: EM | Admit: 2013-07-30 | Discharge: 2013-07-30 | Disposition: A | Discharge status: Home / Self Care | Payer: Medicaid Other | Source: Home / Self Care | Attending: Family Medicine | Admitting: Family Medicine

## 2013-07-30 ENCOUNTER — Encounter (HOSPITAL_COMMUNITY): Payer: Self-pay | Admitting: Emergency Medicine

## 2013-07-30 DIAGNOSIS — S8391XA Sprain of unspecified site of right knee, initial encounter: Secondary | ICD-10-CM

## 2013-07-30 DIAGNOSIS — Y9361 Activity, american tackle football: Secondary | ICD-10-CM

## 2013-07-30 DIAGNOSIS — X500XXA Overexertion from strenuous movement or load, initial encounter: Secondary | ICD-10-CM

## 2013-07-30 DIAGNOSIS — IMO0002 Reserved for concepts with insufficient information to code with codable children: Secondary | ICD-10-CM

## 2013-07-30 NOTE — Discharge Instructions (Signed)
Ice and wear brace as needed for soreness, see your orthopedist if further problems.

## 2013-07-30 NOTE — ED Notes (Signed)
Patient playing football with his son, jumped in the air to catch ball and landed awkwardly

## 2013-07-30 NOTE — ED Provider Notes (Signed)
CSN: [id number]     Arrival date & time 07/30/13  1817 History   First MD Initiated Contact with Patient 07/30/13 1903     Chief Complaint  Patient presents with  . Knee Pain   (Consider location/radiation/quality/duration/timing/severity/associated sxs/prior Treatment) Patient is a 33 y.o. male presenting with knee pain. The history is provided by the patient.  Knee Pain Location:  Knee Injury: yes   Mechanism of injury: fall   Mechanism of injury comment:  Playing football with son and twisted when he caught a ball. Fall:    Impact surface:  Grass   Point of impact:  Knees Knee location:  R knee Chronicity:  New Dislocation: no   Relieved by:  Elevation Worsened by:  Flexion Risk factors comment:  H/o left knee problems and surg.   Past Medical History  Diagnosis Date  . Meningitis   . Encephalitis   . Hypertension   . Depression   . Stroke   . 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 Knee surgery    . Joint replacement     No family history on file. History  Substance Use Topics  . Smoking status: Current Every Day Smoker -- 0.50 packs/day  . Smokeless tobacco: Never Used  . Alcohol Use: No    Review of Systems  Constitutional: Negative.   Musculoskeletal: Positive for gait problem. Negative for joint swelling and myalgias.  Skin: Negative.     Allergies  Ciprofloxacin; Hydrocodone-acetaminophen; Ibuprofen; Propoxyphene n-acetaminophen; and Tramadol hcl  Home Medications   Prior to Admission medications   Medication Sig Start Date End Date Taking? Authorizing Provider  naproxen sodium (ANAPROX) 220 MG tablet Take 440 mg by mouth 2 (two) times daily as needed. For pain    Historical Provider, MD  nicotine (NICODERM CQ - DOSED IN MG/24 HOURS) 14 mg/24hr patch Place 1 patch (14 mg total) onto the skin daily. 06/30/13   Hollice EspySendil K Krishnan, MD   BP 117/71  Pulse  100  Temp(Src) 98.1 F (36.7 C) (Oral)  Resp 14  SpO2 100% Physical Exam  Nursing note and vitals reviewed. Constitutional: He is oriented to person, place, and time. He appears well-developed and well-nourished.  Musculoskeletal: Normal range of motion. He exhibits tenderness.       Right knee: He exhibits normal range of motion, no swelling, no effusion, no deformity, no erythema, normal alignment, no LCL laxity, normal patellar mobility, no bony tenderness and no MCL laxity. Tenderness found. Medial joint line and lateral joint line tenderness noted. No MCL, no LCL and no patellar tendon tenderness noted.  Neurological: He is alert and oriented to person, place, and time.  Skin: Skin is warm and dry.    ED Course  Procedures (including critical care time) Labs Review Labs Reviewed - No data to display  Imaging Review No results found.   MDM   1. Right knee sprain        Linna HoffJames D Dwayne Bulkley, MD 07/30/13 [phone]

## 2013-08-08 ENCOUNTER — Emergency Department (HOSPITAL_COMMUNITY): Payer: Medicaid Other

## 2013-08-08 ENCOUNTER — Encounter (HOSPITAL_COMMUNITY): Payer: Self-pay | Admitting: Emergency Medicine

## 2013-08-08 ENCOUNTER — Emergency Department (INDEPENDENT_AMBULATORY_CARE_PROVIDER_SITE_OTHER)
Admission: EM | Admit: 2013-08-08 | Discharge: 2013-08-08 | Disposition: A | Discharge status: Nursing Home (Includes ICF) | Payer: Medicaid Other | Source: Home / Self Care | Attending: Emergency Medicine | Admitting: Emergency Medicine

## 2013-08-08 ENCOUNTER — Emergency Department (HOSPITAL_COMMUNITY)
Admission: EM | Admit: 2013-08-08 | Discharge: 2013-08-08 | Discharge status: Against Medical Advice | Payer: Medicaid Other | Attending: Emergency Medicine | Admitting: Emergency Medicine

## 2013-08-08 DIAGNOSIS — Z8673 Personal history of transient ischemic attack (TIA), and cerebral infarction without residual deficits: Secondary | ICD-10-CM | POA: Insufficient documentation

## 2013-08-08 DIAGNOSIS — Z8661 Personal history of infections of the central nervous system: Secondary | ICD-10-CM | POA: Insufficient documentation

## 2013-08-08 DIAGNOSIS — H5340 Unspecified visual field defects: Secondary | ICD-10-CM

## 2013-08-08 DIAGNOSIS — R51 Headache: Secondary | ICD-10-CM

## 2013-08-08 DIAGNOSIS — H538 Other visual disturbances: Secondary | ICD-10-CM | POA: Insufficient documentation

## 2013-08-08 DIAGNOSIS — F17200 Nicotine dependence, unspecified, uncomplicated: Secondary | ICD-10-CM | POA: Insufficient documentation

## 2013-08-08 DIAGNOSIS — R519 Headache, unspecified: Secondary | ICD-10-CM

## 2013-08-08 DIAGNOSIS — Z8659 Personal history of other mental and behavioral disorders: Secondary | ICD-10-CM | POA: Insufficient documentation

## 2013-08-08 DIAGNOSIS — H539 Unspecified visual disturbance: Secondary | ICD-10-CM

## 2013-08-08 DIAGNOSIS — I10 Essential (primary) hypertension: Secondary | ICD-10-CM | POA: Insufficient documentation

## 2013-08-08 NOTE — ED Notes (Signed)
Refuses ambulance transport

## 2013-08-08 NOTE — ED Notes (Signed)
Patient transported to CT 

## 2013-08-08 NOTE — ED Provider Notes (Signed)
CSN: [id number]     Arrival date & time 08/08/13  1739 History   First MD Initiated Contact with Patient 08/08/13 1819     Chief Complaint  Patient presents with  . Headache     (Consider location/radiation/quality/duration/timing/severity/associated sxs/prior Treatment) Patient is a 33 y.o. male presenting with neurologic complaint. The history is provided by the patient.  Neurologic Problem This is a new problem. The current episode started today. The problem occurs intermittently. The problem has been resolved. Associated symptoms include a visual change (with resolved loss of peripheral right visual field). Pertinent negatives include no anorexia, chest pain, coughing, fatigue, fever, nausea, neck pain, numbness or weakness. Nothing aggravates the symptoms. He has tried nothing for the symptoms. The treatment provided no relief.    Past Medical History  Diagnosis Date  . Meningitis   . Encephalitis   . Hypertension   . Depression   . Stroke   . 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 Knee surgery    . Joint replacement     No family history on file. History  Substance Use Topics  . Smoking status: Current Every Day Smoker -- 0.50 packs/day  . Smokeless tobacco: Never Used  . Alcohol Use: No    Review of Systems  Constitutional: Negative for fever and fatigue.  Respiratory: Negative for cough.   Cardiovascular: Negative for chest pain.  Gastrointestinal: Negative for nausea and anorexia.  Musculoskeletal: Negative for neck pain.  Neurological: Negative for weakness and numbness.  All other systems reviewed and are negative.     Allergies  Ciprofloxacin; Hydrocodone-acetaminophen; Ibuprofen; Propoxyphene n-acetaminophen; and Tramadol hcl  Home Medications   Prior to Admission medications   Medication Sig Start Date End Date Taking? Authorizing Provider  naproxen  sodium (ANAPROX) 220 MG tablet Take 440 mg by mouth 2 (two) times daily as needed. For pain   Yes Historical Provider, MD   BP 140/95  Pulse 94  Temp(Src) 98.5 F (36.9 C) (Oral)  Resp 18  SpO2 100% Physical Exam  Constitutional: He is oriented to person, place, and time. He appears well-developed and well-nourished. No distress.  HENT:  Head: Normocephalic and atraumatic.  Eyes: Conjunctivae are normal.  Neck: Neck supple. No tracheal deviation present.  Cardiovascular: Normal rate and regular rhythm.   Pulmonary/Chest: Effort normal. No respiratory distress.  Abdominal: Soft. He exhibits no distension.  Neurological: He is alert and oriented to person, place, and time. No cranial nerve deficit. Coordination normal.  Difficult to examine, has persistent stutter, no focal deficits, speech pattern is appropriate.  Skin: Skin is warm and dry.  Psychiatric: He has a normal mood and affect. His behavior is normal. Thought content normal. Cognition and memory are normal.    ED Course  Procedures (including critical care time) Labs Review Labs Reviewed - No data to display  Imaging Review Ct Head Wo Contrast  08/08/2013   CLINICAL DATA:  Headache intermittently for 1 week, today headache during church with episode of loss of peripheral vision in RIGHT eye  EXAM: CT HEAD WITHOUT CONTRAST  TECHNIQUE: Contiguous axial images were obtained from the base of the skull through the vertex without intravenous contrast.  COMPARISON:  08/11/2010  FINDINGS: Minimal atrophy.  Normal ventricular morphology.  No midline shift or mass effect.  Normal appearance of brain parenchyma.  No intracranial hemorrhage, mass lesion, or acute infarction.  Visualized paranasal  sinuses and mastoid air cells clear.  Bones unremarkable.  IMPRESSION: No acute intracranial abnormalities.   Electronically Signed   By: Ulyses SouthwardMark  Boles M.D.   On: 08/08/2013 19:09     EKG Interpretation None      MDM   Final diagnoses:   Headache  Transient visual disturbance, right   33 y.o. male presents with headache nightly for 8 days, presented to urgent care today after having a sudden onset of loss of right sided peripheral vision which was documented at the urgent care has been present. He no longer has this deficit here with history of encephalitis, prior stroke, and complicated neuro exam opted for neuroimaging today, discussed the need for involvement of neurology for possible TIA given resolution and need for monitoring with subsequent MRI for full evaluation and the patient declined this, signing out AGAINST MEDICAL ADVICE prior . I discussed the risks of leaving and missing a sentinel finding of potential stroke and the patient expressed understanding and appears to make decisions without any significant impairment.    Lyndal Pulleyaniel Hafiz Irion, MD 08/09/13 [phone]

## 2013-08-08 NOTE — ED Notes (Addendum)
Headache.  Headache for one week, intermittent headache that he has been treating with aleve and rest.  Today during church had an episode of loss peripheral vision, right.

## 2013-08-08 NOTE — ED Notes (Signed)
Patient requesting to leave to go get his child.  Instructed that we are waiting for the results of the CT scan.  Moves both legs well, left arm contracted from a previous stroke.

## 2013-08-08 NOTE — ED Notes (Signed)
Patient requesting to go.  Discharge inst given and told him to come back if he gets worse.  States he will.

## 2013-08-08 NOTE — ED Provider Notes (Signed)
Medical screening examination/treatment/procedure(s) were performed by non-physician practitioner and as supervising physician I was immediately available for consultation/collaboration.  Jaxston Chohan, M.D.  Mariesha Venturella C Jennise Both, MD 08/08/13 2235 

## 2013-08-08 NOTE — Discharge Instructions (Signed)

## 2013-08-08 NOTE — ED Provider Notes (Signed)
CSN: [id number]     Arrival date & time 08/08/13  1554 History   First MD Initiated Contact with Patient 08/08/13 1700     Chief Complaint  Patient presents with  . Headache   (Consider location/radiation/quality/duration/timing/severity/associated sxs/prior Treatment) HPI Comments: Patient reports a one week history of a bitemporal and frontal headache that has waxed and waned over the past 7 days. Using Aleve with little relief. Denies fever or recent head trauma. Does report remote hx of both meningitis and encephalitis at age 33 and surgical correction of cerebral aneurism at age 33.  Endorses that he suffers from chronic intermittent headache, but what prompted his visit today was a change in vision. States he was seated in church this morning when he developed loss of right sided peripheral vision. He discussed this with his fiancee who encouraged him to come for evaluation. Denies any episodes of vision change or loss with previous headaches.   Patient is a 33 y.o. male presenting with headaches. The history is provided by the patient.  Headache Pain location:  Frontal, L temporal and R temporal Associated symptoms: no dizziness, no numbness and no seizures     Past Medical History  Diagnosis Date  . Meningitis   . Encephalitis   . Hypertension   . Depression   . Stroke   . 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 Knee surgery    . Joint replacement     No family history on file. History  Substance Use Topics  . Smoking status: Current Every Day Smoker -- 0.50 packs/day  . Smokeless tobacco: Never Used  . Alcohol Use: No    Review of Systems  Constitutional: Negative.   Eyes: Positive for visual disturbance.  Respiratory: Negative.   Cardiovascular: Negative.   Gastrointestinal: Negative.   Endocrine: Negative.   Genitourinary: Negative.   Musculoskeletal: Negative.    Skin: Negative.   Neurological: Positive for headaches. Negative for dizziness, tremors, seizures, syncope, facial asymmetry, speech difficulty, weakness, light-headedness and numbness.  Psychiatric/Behavioral: Negative.     Allergies  Ciprofloxacin; Hydrocodone-acetaminophen; Ibuprofen; Propoxyphene n-acetaminophen; and Tramadol hcl  Home Medications   Prior to Admission medications   Medication Sig Start Date End Date Taking? Authorizing Provider  naproxen sodium (ANAPROX) 220 MG tablet Take 440 mg by mouth 2 (two) times daily as needed. For pain    Historical Provider, MD  nicotine (NICODERM CQ - DOSED IN MG/24 HOURS) 14 mg/24hr patch Place 1 patch (14 mg total) onto the skin daily. 06/30/13   Hollice EspySendil K Krishnan, MD   BP 119/73  Pulse 85  Temp(Src) 98.4 F (36.9 C) (Oral)  Resp 19  SpO2 100% Physical Exam  Nursing note and vitals reviewed. Constitutional: He is oriented to person, place, and time. He appears well-developed and well-nourished. He is active and cooperative. He does not appear ill. No distress.  HENT:  Head: Normocephalic and atraumatic.  Right Ear: Hearing normal.  Left Ear: Hearing normal.  Nose: Nose normal.  Eyes: Conjunctivae, EOM and lids are normal. Pupils are equal, round, and reactive to light. No scleral icterus.  Neck: Normal range of motion, full passive range of motion without pain and phonation normal.  Cardiovascular: Normal rate.   Pulmonary/Chest: Effort normal.  Abdominal: There is no tenderness.  Neurological: He is alert and oriented to person, place, and time. He has normal strength. No sensory deficit. Coordination  and gait normal. GCS eye subscore is 4. GCS verbal subscore is 5. GCS motor subscore is 6.  Visual fields tested by confrontation and left visual fields appear intact while patient reports he is unable to see finger movements of examiner in right peripheral field until approximately 45 degrees from lateral most periphery.   Skin:  Skin is warm, dry and intact. No rash noted.  Psychiatric: He has a normal mood and affect. His speech is normal and behavior is normal. Thought content normal. Cognition and memory are normal.    ED Course  Procedures (including critical care time) Labs Review Labs Reviewed - No data to display  Imaging Review No results found.   MDM   1. Headache   2. Visual field defect   33 y/o male with 7 day history of headache with new onset visual field deficit x 8 hours. Will transfer to [facility] ER for further evaluation. Patient refused transportation via EMS and asked if he could drive his own vehicle. I advised against driving himself and he was willing to compromise and ride to ED in [facility] shuttle Jinny Sanders [place], [place] 08/08/13 [phone]

## 2013-08-08 NOTE — ED Notes (Signed)
Pt states that he has had intermittent headaches. No neuro deficits noted.

## 2013-08-10 NOTE — ED Provider Notes (Signed)
I saw and evaluated the patient, reviewed the resident's note and I agree with the findings and plan.   EKG Interpretation None     Patient with visual field cut and headache. Had previous history of stroke. No neuro deficits ear, but has had documented neuro deficits. Patient is not willing to stay for further treatment. He is leaving the ER AMA  Harrold Donath R. Rubin Payor, MD 08/10/13 [phone]

## 2013-10-25 ENCOUNTER — Encounter (HOSPITAL_BASED_OUTPATIENT_CLINIC_OR_DEPARTMENT_OTHER): Payer: Medicaid Other | Attending: Plastic Surgery

## 2014-09-15 ENCOUNTER — Encounter (HOSPITAL_COMMUNITY): Payer: Self-pay

## 2014-09-15 ENCOUNTER — Emergency Department (HOSPITAL_COMMUNITY)
Admission: EM | Admit: 2014-09-15 | Discharge: 2014-09-16 | Disposition: A | Discharge status: Home / Self Care | Payer: Medicaid Other | Attending: Emergency Medicine | Admitting: Emergency Medicine

## 2014-09-15 DIAGNOSIS — I10 Essential (primary) hypertension: Secondary | ICD-10-CM | POA: Insufficient documentation

## 2014-09-15 DIAGNOSIS — Y999 Unspecified external cause status: Secondary | ICD-10-CM | POA: Insufficient documentation

## 2014-09-15 DIAGNOSIS — Z8669 Personal history of other diseases of the nervous system and sense organs: Secondary | ICD-10-CM | POA: Insufficient documentation

## 2014-09-15 DIAGNOSIS — Y939 Activity, unspecified: Secondary | ICD-10-CM | POA: Diagnosis not present

## 2014-09-15 DIAGNOSIS — L97929 Non-pressure chronic ulcer of unspecified part of left lower leg with unspecified severity: Secondary | ICD-10-CM | POA: Insufficient documentation

## 2014-09-15 DIAGNOSIS — Z8659 Personal history of other mental and behavioral disorders: Secondary | ICD-10-CM | POA: Insufficient documentation

## 2014-09-15 DIAGNOSIS — Z8673 Personal history of transient ischemic attack (TIA), and cerebral infarction without residual deficits: Secondary | ICD-10-CM | POA: Diagnosis not present

## 2014-09-15 DIAGNOSIS — Z720 Tobacco use: Secondary | ICD-10-CM | POA: Diagnosis not present

## 2014-09-15 DIAGNOSIS — X58XXXA Exposure to other specified factors, initial encounter: Secondary | ICD-10-CM | POA: Insufficient documentation

## 2014-09-15 DIAGNOSIS — S81002A Unspecified open wound, left knee, initial encounter: Secondary | ICD-10-CM | POA: Insufficient documentation

## 2014-09-15 DIAGNOSIS — Y929 Unspecified place or not applicable: Secondary | ICD-10-CM | POA: Insufficient documentation

## 2014-09-15 DIAGNOSIS — M25562 Pain in left knee: Secondary | ICD-10-CM | POA: Diagnosis present

## 2014-09-15 NOTE — ED Notes (Signed)
Pt presents with c/o left leg pain. Pt reports that he has had 13 surgeries on that leg, no new injuries.

## 2014-09-16 MED ORDER — BACITRACIN ZINC 500 UNIT/GM EX OINT
TOPICAL_OINTMENT | CUTANEOUS | Status: AC
  Administered 2014-09-16: 1
  Filled 2014-09-16: qty 0.9

## 2014-09-16 MED ORDER — CEPHALEXIN 500 MG PO CAPS
500.0000 mg | ORAL_CAPSULE | Freq: Once | ORAL | Status: AC
  Administered 2014-09-16: 500 mg via ORAL
  Filled 2014-09-16: qty 1

## 2014-09-16 MED ORDER — CEPHALEXIN 500 MG PO CAPS: 500.0000 mg | ORAL_CAPSULE | Freq: Four times a day (QID) | ORAL | Status: DC

## 2014-09-16 NOTE — Discharge Instructions (Signed)

## 2014-09-16 NOTE — ED Provider Notes (Signed)
CSN: [id number]     Arrival date & time 09/15/14  2258 History   First MD Initiated Contact with Patient 09/16/14 0009     Chief Complaint  Patient presents with  . Leg Pain     (Consider location/radiation/quality/duration/timing/severity/associated sxs/prior Treatment) Patient is a 34 y.o. male presenting with leg pain. The history is provided by the patient. No language interpreter was used.  Leg Pain Location:  Knee Injury: no   Knee location:  L knee Chronicity:  Recurrent Dislocation: no   Foreign body present:  No foreign bodies Tetanus status:  Out of date Associated symptoms: no fever   Associated symptoms comment:  Left anterior knee pain where a chronic ulceration reopened earlier this week (x 2 days). No fever. The wound drains.    Past Medical History  Diagnosis Date  . Meningitis   . Encephalitis   . Hypertension   . Depression   . Stroke   . 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 Knee surgery    . Joint replacement     No family history on file. History  Substance Use Topics  . Smoking status: Current Every Day Smoker -- 0.50 packs/day  . Smokeless tobacco: Never Used  . Alcohol Use: No    Review of Systems  Constitutional: Negative for fever and chills.  Musculoskeletal:       See HPI  Skin: Positive for wound.  Neurological: Negative.       Allergies  Ciprofloxacin; Hydrocodone-acetaminophen; Ibuprofen; Propoxyphene n-acetaminophen; and Tramadol hcl  Home Medications   Prior to Admission medications   Medication Sig Start Date End Date Taking? Authorizing Provider  naproxen sodium (ANAPROX) 220 MG tablet Take 440 mg by mouth 2 (two) times daily as needed. For pain    Historical Provider, MD   BP 108/72 mmHg  Pulse 87  Temp(Src) 97.7 F (36.5 C) (Oral)  Resp 16  SpO2 100% Physical Exam  Constitutional: He is oriented to person,  place, and time. He appears well-developed and well-nourished.  Neck: Normal range of motion.  Pulmonary/Chest: Effort normal.  Musculoskeletal:  Left anterior knee moderately tender surrounding chronic ulceration with central area of drainage. No redness. No calf or thigh tenderness. FROM knee joint.  Neurological: He is alert and oriented to person, place, and time.  Skin: Skin is warm and dry.  Psychiatric: He has a normal mood and affect.    ED Course  Procedures (including critical care time) Labs Review Labs Reviewed - No data to display  Imaging Review No results found.   EKG Interpretation None      MDM   Final diagnoses:  None    1. Left knee wound  Recurrent opening of chronic ulceration now draining and painful. No fever. No surrounding cellulitis. Will place on antibiotics. He has scheduled follow up with his doctor next week for recheck.     Elpidio AnisShari Sharif Rendell, PA-C 09/16/14 0102  Layla MawKristen N Ward, DO 09/16/14 [id number]

## 2014-09-16 NOTE — ED Notes (Signed)
Patient to restroom via wheelchair.

## 2014-09-16 NOTE — ED Notes (Signed)
Patient c/o left knee pain for a couple days, denies injuries. Sore noted to knee, patient states this is chronic.

## 2014-09-16 NOTE — ED Notes (Signed)
PA at bedside.

## 2014-09-16 NOTE — ED Notes (Signed)
Knee dressed with bacitracin, sterile guaze, secured with kling wrap.

## 2015-01-04 ENCOUNTER — Encounter (HOSPITAL_BASED_OUTPATIENT_CLINIC_OR_DEPARTMENT_OTHER): Payer: Medicaid Other

## 2015-01-31 ENCOUNTER — Other Ambulatory Visit: Payer: Self-pay

## 2015-01-31 ENCOUNTER — Encounter (HOSPITAL_BASED_OUTPATIENT_CLINIC_OR_DEPARTMENT_OTHER): Payer: Medicaid Other | Attending: General Surgery

## 2015-01-31 DIAGNOSIS — T8189XA Other complications of procedures, not elsewhere classified, initial encounter: Secondary | ICD-10-CM | POA: Diagnosis present

## 2015-01-31 DIAGNOSIS — J45909 Unspecified asthma, uncomplicated: Secondary | ICD-10-CM | POA: Insufficient documentation

## 2015-01-31 DIAGNOSIS — L84 Corns and callosities: Secondary | ICD-10-CM | POA: Insufficient documentation

## 2015-01-31 DIAGNOSIS — F17200 Nicotine dependence, unspecified, uncomplicated: Secondary | ICD-10-CM | POA: Insufficient documentation

## 2015-01-31 DIAGNOSIS — Z86718 Personal history of other venous thrombosis and embolism: Secondary | ICD-10-CM | POA: Diagnosis not present

## 2015-10-16 ENCOUNTER — Encounter (HOSPITAL_BASED_OUTPATIENT_CLINIC_OR_DEPARTMENT_OTHER): Payer: Medicaid Other | Attending: Internal Medicine

## 2015-10-16 DIAGNOSIS — M7052 Other bursitis of knee, left knee: Secondary | ICD-10-CM | POA: Insufficient documentation

## 2015-10-16 DIAGNOSIS — I10 Essential (primary) hypertension: Secondary | ICD-10-CM | POA: Insufficient documentation

## 2015-10-16 DIAGNOSIS — Z86718 Personal history of other venous thrombosis and embolism: Secondary | ICD-10-CM | POA: Insufficient documentation

## 2015-10-16 DIAGNOSIS — L97823 Non-pressure chronic ulcer of other part of left lower leg with necrosis of muscle: Secondary | ICD-10-CM | POA: Diagnosis present

## 2015-10-23 ENCOUNTER — Encounter (HOSPITAL_BASED_OUTPATIENT_CLINIC_OR_DEPARTMENT_OTHER): Payer: Medicaid Other | Attending: Internal Medicine

## 2015-11-06 ENCOUNTER — Encounter (HOSPITAL_COMMUNITY): Payer: Self-pay | Admitting: Nurse Practitioner

## 2015-11-06 ENCOUNTER — Emergency Department (HOSPITAL_COMMUNITY)
Admission: EM | Admit: 2015-11-06 | Discharge: 2015-11-06 | Disposition: A | Discharge status: Home / Self Care | Payer: Medicaid Other | Attending: Emergency Medicine | Admitting: Emergency Medicine

## 2015-11-06 ENCOUNTER — Emergency Department (HOSPITAL_COMMUNITY): Payer: Medicaid Other

## 2015-11-06 DIAGNOSIS — I10 Essential (primary) hypertension: Secondary | ICD-10-CM | POA: Diagnosis not present

## 2015-11-06 DIAGNOSIS — Y92000 Kitchen of unspecified non-institutional (private) residence as  the place of occurrence of the external cause: Secondary | ICD-10-CM | POA: Insufficient documentation

## 2015-11-06 DIAGNOSIS — F17200 Nicotine dependence, unspecified, uncomplicated: Secondary | ICD-10-CM | POA: Diagnosis not present

## 2015-11-06 DIAGNOSIS — Y999 Unspecified external cause status: Secondary | ICD-10-CM | POA: Insufficient documentation

## 2015-11-06 DIAGNOSIS — S8992XA Unspecified injury of left lower leg, initial encounter: Secondary | ICD-10-CM | POA: Diagnosis present

## 2015-11-06 DIAGNOSIS — Y9301 Activity, walking, marching and hiking: Secondary | ICD-10-CM | POA: Insufficient documentation

## 2015-11-06 DIAGNOSIS — W010XXA Fall on same level from slipping, tripping and stumbling without subsequent striking against object, initial encounter: Secondary | ICD-10-CM | POA: Diagnosis not present

## 2015-11-06 DIAGNOSIS — Z8673 Personal history of transient ischemic attack (TIA), and cerebral infarction without residual deficits: Secondary | ICD-10-CM | POA: Diagnosis not present

## 2015-11-06 DIAGNOSIS — S81002A Unspecified open wound, left knee, initial encounter: Secondary | ICD-10-CM | POA: Diagnosis not present

## 2015-11-06 DIAGNOSIS — R55 Syncope and collapse: Secondary | ICD-10-CM | POA: Diagnosis not present

## 2015-11-06 DIAGNOSIS — Z9660 Presence of unspecified orthopedic joint implant: Secondary | ICD-10-CM | POA: Insufficient documentation

## 2015-11-06 LAB — COMPREHENSIVE METABOLIC PANEL
ALBUMIN: 4.1 g/dL (ref 3.5–5.0)
ALT: 11 U/L — ABNORMAL LOW (ref 17–63)
AST: 15 U/L (ref 15–41)
Alkaline Phosphatase: 72 U/L (ref 38–126)
Anion gap: 5 (ref 5–15)
BUN: 16 mg/dL (ref 6–20)
CO2: 29 mmol/L (ref 22–32)
Calcium: 9.8 mg/dL (ref 8.9–10.3)
Chloride: 107 mmol/L (ref 101–111)
Creatinine, Ser: 0.92 mg/dL (ref 0.61–1.24)
GFR calc Af Amer: >60 mL/min (ref >60)
GFR calc non Af Amer: >60 mL/min (ref >60)
GLUCOSE: 97 mg/dL (ref 65–99)
POTASSIUM: 4.4 mmol/L (ref 3.5–5.1)
Sodium: 141 mmol/L (ref 135–145)
Total Bilirubin: 0.6 mg/dL (ref 0.3–1.2)
Total Protein: 7.2 g/dL (ref 6.5–8.1)

## 2015-11-06 LAB — CBC WITH DIFFERENTIAL/PLATELET
Basophils Absolute: 0.1 10*3/uL (ref 0.0–0.1)
Basophils Relative: 1 %
Eosinophils Absolute: 0.1 10*3/uL (ref 0.0–0.7)
Eosinophils Relative: 2 %
HCT: 42.8 % (ref 39.0–52.0)
Hemoglobin: 14.4 g/dL (ref 13.0–17.0)
LYMPHS ABS: 2.1 10*3/uL (ref 0.7–4.0)
Lymphocytes Relative: 27 %
MCH: 28.5 pg (ref 26.0–34.0)
MCHC: 33.6 g/dL (ref 30.0–36.0)
MCV: 84.8 fL (ref 78.0–100.0)
MONO ABS: 0.8 10*3/uL (ref 0.1–1.0)
MONOS PCT: 10 %
NEUTROS ABS: 4.8 10*3/uL (ref 1.7–7.7)
Neutrophils Relative %: 60 %
Platelets: 429 10*3/uL — ABNORMAL HIGH (ref 150–400)
RBC: 5.05 MIL/uL (ref 4.22–5.81)
RDW: 13.6 % (ref 11.5–15.5)
WBC: 7.9 10*3/uL (ref 4.0–10.5)

## 2015-11-06 LAB — I-STAT CG4 LACTIC ACID, ED
Lactic Acid, Venous: 1.03 mmol/L (ref 0.5–1.9)
Lactic Acid, Venous: 1.89 mmol/L (ref 0.5–1.9)

## 2015-11-06 LAB — I-STAT TROPONIN, ED: TROPONIN I, POC: 0 ng/mL (ref 0.00–0.08)

## 2015-11-06 MED ORDER — SULFAMETHOXAZOLE-TRIMETHOPRIM 800-160 MG PO TABS: 1.0000 | ORAL_TABLET | Freq: Two times a day (BID) | ORAL | 0 refills | Status: AC

## 2015-11-06 MED ORDER — SULFAMETHOXAZOLE-TRIMETHOPRIM 800-160 MG PO TABS
1.0000 | ORAL_TABLET | Freq: Once | ORAL | Status: AC
  Administered 2015-11-06: 1 via ORAL
  Filled 2015-11-06: qty 1

## 2015-11-06 MED ORDER — CEPHALEXIN 250 MG PO CAPS
500.0000 mg | ORAL_CAPSULE | Freq: Once | ORAL | Status: AC
  Administered 2015-11-06: 500 mg via ORAL
  Filled 2015-11-06: qty 2

## 2015-11-06 MED ORDER — SULFAMETHOXAZOLE-TRIMETHOPRIM 800-160 MG PO TABS: 1.0000 | ORAL_TABLET | Freq: Two times a day (BID) | ORAL | 0 refills | Status: DC

## 2015-11-06 MED ORDER — CEPHALEXIN 500 MG PO CAPS: 500.0000 mg | ORAL_CAPSULE | Freq: Four times a day (QID) | ORAL | 0 refills | Status: DC

## 2015-11-06 MED ORDER — CEPHALEXIN 500 MG PO CAPS
500.0000 mg | ORAL_CAPSULE | Freq: Four times a day (QID) | ORAL | 0 refills | Status: DC
Start: 2015-11-06 — End: 2015-11-06

## 2015-11-06 NOTE — Discharge Instructions (Signed)
Keep wound clean with soap and warm water at home.  Recommend dressing changes at least once daily. Take the antibiotics as directed. Follow-up with your orthopedist-- call to make appt. Return here for any new/worsening symptoms.

## 2015-11-06 NOTE — ED Triage Notes (Addendum)
Pt c/o 3 day history of painful wound to L knee. The wound started as  an abrasion after he fell onto the knee, and is now open with tunneling and purulent drainage. He has been taking advil with some pain relief. He is alert and breathing easily.

## 2015-11-06 NOTE — ED Notes (Signed)
Patient verbalized understanding of discharge instructions and denies any further needs or questions at this time. VS stable. Patient ambulatory with steady gait and use of cane.

## 2015-11-06 NOTE — ED Provider Notes (Signed)
[facility]-EMERGENCY DEPT Provider Note   CSN: [id number] Arrival date & time: 11/06/15  1628     History   Chief Complaint Chief Complaint  Patient presents with  . Wound Infection    HPI [name] is a 35 y.o. male.  The history is provided by the patient and medical records.    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  Patient states on Saturday morning he was walking through his kitchen and got dizzy causing him to fall onto his left knee.  He denies any head injury or LOC.  States he had a small "scrape" on the knee but it has gotten bigger and started draining over the past 2 days.  Denies fever but doed endorse some "cold" chills.  States he has had some continued pain in his left leg since the fall. He denies any further falls. His tetanus is up-to-date. He denies hx of diabetes, HIV, or MRSA.  [facility]. He states he called their office today, however they could not see him so he decided to come here instead. He has not tried any treatments at home.  Past Medical History:  Diagnosis Date  . Depression   . Encephalitis   . Hypertension   . Meningitis   . 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 Stroke [facility](HCC)     Patient Active Problem List   Diagnosis Date Noted  . HCAP (healthcare-associated pneumonia) 06/28/2013  . Chest pain, atypical 06/28/2013  . SIRS (systemic inflammatory response syndrome) (HCC) 06/28/2013  . Hyponatremia 06/28/2013  . Headache 01/05/2013  . Leg pain 01/05/2013  . Dizziness 01/05/2013  . 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 CIGARETTE SMOKER 06/10/2007  . DEPRESSION 06/10/2007  . 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 POSTTRAUMATIC WOUND INFECTION NEC 06/10/2007    Past  Surgical History:  Procedure Laterality Date  . JOINT REPLACEMENT    . KNEE SURGERY         Home Medications    Prior to Admission medications   Medication Sig Start Date End Date Taking? Authorizing Provider  cephALEXin (KEFLEX) 500 MG capsule Take 1 capsule (500 mg total) by mouth 4 (four) times daily. 09/16/14   Elpidio AnisShari Upstill, PA-C  naproxen sodium (ANAPROX) 220 MG tablet Take 440 mg by mouth 2 (two) times daily as needed. For pain    Historical Provider, MD    Family History History reviewed. No pertinent family history.  Social History Social History  Substance Use Topics  . Smoking status: Current Every Day Smoker    Packs/day: 0.50  . Smokeless tobacco: Never Used  . Alcohol use No     Allergies   Ciprofloxacin; Hydrocodone-acetaminophen; Ibuprofen; Propoxyphene n-acetaminophen; and Tramadol hcl   Review of Systems Review of Systems  Skin: Positive for wound.  All other systems reviewed and are negative.    Physical Exam Updated Vital Signs BP 131/95 (BP Location: Left Arm)   Pulse 69   Temp 99.7 F (37.6 C) (Oral)   Resp 18   Ht 6\' 1"  (1.854 m)   Wt 79.8 kg   SpO2 100%   BMI 23.22 kg/m   Physical Exam  Constitutional: He is oriented to person, place, and time. He appears well-developed and well-nourished.  Laughing, smiling, joking, very pleasant during  exam  HENT:  Head: Normocephalic and atraumatic.  Mouth/Throat: Oropharynx is clear and moist.  Eyes: Conjunctivae and EOM are normal. Pupils are equal, round, and reactive to light.  Neck: Normal range of motion.  Cardiovascular: Normal rate, regular rhythm and normal heart sounds.   Pulmonary/Chest: Effort normal and breath sounds normal.  Abdominal: Soft. Bowel sounds are normal.  Musculoskeletal: Normal range of motion.  Left proximal shin with open wound where overlying skin has been avulsed away-- appears to have some crusting and serosanguinous drainage; there is no deep track in the joint  space; there is mild swelling without bony deformity; no bleeding; no significant surrounding erythema or induration, no warmth to touch Maintains full ROM of the left knee; ambulating with cane which is baseline  Neurological: He is alert and oriented to person, place, and time.  AAOx3, answering questions and following commands appropriately; equal strength UE and LE bilaterally; CN grossly intact; moves all extremities appropriately without ataxia; no focal neuro deficits or facial asymmetry appreciated; mild speech impediment noted (chronic), ambulatory with cane (baseline)  Skin: Skin is warm and dry.  Psychiatric: He has a normal mood and affect.  Nursing note and vitals reviewed.      ED Treatments / Results  Labs (all labs ordered are listed, but only abnormal results are displayed) Labs Reviewed  COMPREHENSIVE METABOLIC PANEL - Abnormal; Notable for the following:       Result Value   ALT 11 (*)    All other components within normal limits  CBC WITH DIFFERENTIAL/PLATELET - Abnormal; Notable for the following:    Platelets 429 (*)    All other components within normal limits  AEROBIC CULTURE (SUPERFICIAL SPECIMEN)  I-STAT CG4 LACTIC ACID, ED  I-STAT CG4 LACTIC ACID, ED  I-STAT TROPOININ, ED    EKG  EKG Interpretation None       Radiology Dg Knee Complete 4 Views Left  Result Date: 11/06/2015 CLINICAL DATA:  Fall, abrasion to left knee, now with open wound and drainage EXAM: LEFT KNEE - COMPLETE 4+ VIEW COMPARISON:  01/05/2013 FINDINGS: No fracture or dislocation is seen. The joint spaces are preserved. Mild soft tissue swelling/irregularity along the anterior aspect of the proximal tibia. No radiopaque foreign body is seen. IMPRESSION: Mild soft swelling/irregularity along the anterior aspect of the proximal tibia. No fracture, dislocation, or radiopaque foreign body is seen. Electronically Signed   By: Charline Bills M.D.   On: 11/06/2015 17:02     Procedures Procedures (including critical care time)  Medications Ordered in ED Medications - No data to display   Initial Impression / Assessment and Plan / ED Course  I have reviewed the triage vital signs and the nursing notes.  Pertinent labs & imaging results that were available during my care of the patient were reviewed by me and considered in my medical decision making (see chart for details).  Clinical Course   35 year old male here with wound of left knee after a near syncopal event at home over the weekend.  States initially this started as a "scrape" however has gotten larger.  He is afebrile and nontoxic.  He is awake, alert, fully oriented to his baseline on exam (does have mild speech impediment and gait irregularity which he states is chronic from his bout of meningitis 20 years ago). He has no focal neurologic deficits. Left knee does have wound with some crusting and serosanguineous drainage. There is no deep tracts or tunneling of the wound into the joint  space. There is no significant swelling, bony deformity, erythema, or warmth to touch. Full ROM of left knee.  Leg is NVI.  Plain films of the left knee with some soft tissue irregularity consistent with his open wound. No fracture noted. His tetanus is up-to-date. Labwork is overall reassuring. EKG is unchanged from prior.  Wound culture of the left knee was obtained.  Will start on bactrim/keflex pending this.  Wound was irrigated and bandaged here.  [facility] that he plans to follow-up with later this week.    In regards to his near syncope, this appears to be an isolated event.  He remains at his neurologic baseline here.  He has no further complaints from this.  No chest pain or SOB.  Since this event was 48 hours ago, feel he is stable for discharge with outpatient follow-up with his PCP.  Discussed plan with patient, he acknowledged understanding and agreed with plan of care.   Return precautions given for new or worsening symptoms.  Case discussed with attending physician, Dr. Radford PaxBeaton, who evaluated patient and agrees with assessment and plan of care.  Final Clinical Impressions(s) / ED Diagnoses   Final diagnoses:  Open wound of left knee, initial encounter  Syncope, unspecified syncope type      Garlon HatchetLisa M Pacey Altizer, PA-C 11/06/15 2337    Nelva Nayobert Beaton, MD 11/08/15 1352

## 2015-11-09 LAB — AEROBIC CULTURE  (SUPERFICIAL SPECIMEN)

## 2015-11-09 LAB — AEROBIC CULTURE W GRAM STAIN (SUPERFICIAL SPECIMEN)

## 2015-11-10 ENCOUNTER — Telehealth (HOSPITAL_BASED_OUTPATIENT_CLINIC_OR_DEPARTMENT_OTHER): Payer: Self-pay | Admitting: Emergency Medicine

## 2015-11-10 NOTE — Telephone Encounter (Signed)
Post ED Visit - Positive Culture Follow-up  Culture report reviewed by antimicrobial stewardship pharmacist:  []  Enzo BiNathan Batchelder, Pharm.D. []  Celedonio MiyamotoJeremy Frens, Pharm.D., BCPS []  Garvin FilaMike Maccia, Pharm.D. []  Georgina PillionElizabeth Martin, Pharm.D., BCPS []  Cantua CreekMinh Pham, VermontPharm.D., BCPS, AAHIVP []  Estella HuskMichelle Turner, Pharm.D., BCPS, AAHIVP []  Tennis Mustassie Stewart, Pharm.D. []  Sherle Poeob Vincent, [address] BoulevardPharm[address]D. Vianne BullsKai Kong RPh  Positive wound culture Treated with cephalexin and bactrim DS ,organism sensitive to the same and no further patient follow-up is required at this time.  Berle MullMiller, Glorie Dowlen 11/10/2015, 4:40 PM

## 2016-10-21 ENCOUNTER — Encounter (HOSPITAL_BASED_OUTPATIENT_CLINIC_OR_DEPARTMENT_OTHER): Payer: Medicaid Other

## 2017-02-11 ENCOUNTER — Emergency Department (HOSPITAL_COMMUNITY)
Admission: EM | Admit: 2017-02-11 | Discharge: 2017-02-11 | Disposition: A | Discharge status: Home / Self Care | Payer: Medicaid Other | Attending: Emergency Medicine | Admitting: Emergency Medicine

## 2017-02-11 ENCOUNTER — Encounter (HOSPITAL_COMMUNITY): Payer: Self-pay

## 2017-02-11 ENCOUNTER — Emergency Department (HOSPITAL_COMMUNITY): Payer: Medicaid Other

## 2017-02-11 ENCOUNTER — Other Ambulatory Visit: Payer: Self-pay

## 2017-02-11 DIAGNOSIS — I10 Essential (primary) hypertension: Secondary | ICD-10-CM | POA: Diagnosis not present

## 2017-02-11 DIAGNOSIS — F17210 Nicotine dependence, cigarettes, uncomplicated: Secondary | ICD-10-CM | POA: Diagnosis not present

## 2017-02-11 DIAGNOSIS — L03115 Cellulitis of right lower limb: Secondary | ICD-10-CM | POA: Diagnosis not present

## 2017-02-11 DIAGNOSIS — Z79899 Other long term (current) drug therapy: Secondary | ICD-10-CM | POA: Insufficient documentation

## 2017-02-11 DIAGNOSIS — M79604 Pain in right leg: Secondary | ICD-10-CM | POA: Diagnosis present

## 2017-02-11 MED ORDER — OXYCODONE-ACETAMINOPHEN 5-325 MG PO TABS
1.0000 | ORAL_TABLET | Freq: Once | ORAL | Status: AC
  Administered 2017-02-11: 1 via ORAL
  Filled 2017-02-11: qty 1

## 2017-02-11 MED ORDER — SULFAMETHOXAZOLE-TRIMETHOPRIM 800-160 MG PO TABS: 1.0000 | ORAL_TABLET | Freq: Two times a day (BID) | ORAL | 0 refills | Status: AC

## 2017-02-11 NOTE — Discharge Instructions (Signed)
Apply warm wet compresses to the knee. Follow up with your doctor or the orthopedic doctor. Return here for worsening symptoms.

## 2017-02-11 NOTE — ED Provider Notes (Signed)
Medical screening examination/treatment/procedure(s) were conducted as a shared visit with non-physician practitioner(s) and myself.  I personally evaluated the patient during the encounter.   EKG Interpretation None      36 year old male who presents with right knee pain.  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  Reports right knee pain and swelling.  Has been ambulatory.  Denies any fall or trauma or any injuries.  Denies fevers or chills.  Patient afebrile, hemodynamically stable, well-appearing.  There is prepatellar soft tissue swelling, warmth to touch and mild erythema.  X-ray visualized and does not show a joint effusion.  He has normal range of motion of the knee has been ambulatory on it.  Bedside ultrasound performed and does not show fluid collection or underlying abscess.  Will treat as cellulitis. Strict return and follow-up instructions reviewed. He expressed understanding of all discharge instructions and felt comfortable with the plan of care.    Lavera GuiseLiu, Treavor Blomquist Duo, MD 02/11/17 [phone]

## 2017-02-11 NOTE — ED Provider Notes (Signed)
[facility]-EMERGENCY DEPT Provider Note   CSN: [id number] Arrival date & time: 02/11/17  1038     History   Chief Complaint No chief complaint on file.   HPI [name] is a 36 y.o. male who presents to the ED with leg pain. The pain is in the right leg and started 3 days ago. The pain radiates to the groin.  He rates the pain as 9/10. Patient denies injury to the leg. He has been taking Naprosyn without relief. Patient reports increased pain with ambulation. No fever or chills. Patient reports similar problem with left knee and is under the care of an orthopedic doctor who did surgery on the knee and it still has an open wound.   The history is provided by the patient. No language interpreter was used.  Leg Pain   This is a new problem. The current episode started more than 2 days ago. The problem occurs constantly. The problem has been gradually worsening. The pain is present in the right upper leg. Pertinent negatives include no numbness. The symptoms are aggravated by activity and standing. He has tried OTC pain medications for the symptoms. There has been no history of extremity trauma.    Past Medical History:  Diagnosis Date  . Depression   . Encephalitis   . Hypertension   . Meningitis   . 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 Stroke [facility](HCC)     Patient Active Problem List   Diagnosis Date Noted  . HCAP (healthcare-associated pneumonia) 06/28/2013  . Chest pain, atypical 06/28/2013  . SIRS (systemic inflammatory response syndrome) (HCC) 06/28/2013  . Hyponatremia 06/28/2013  . Headache 01/05/2013  . Leg pain 01/05/2013  . Dizziness 01/05/2013  . 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 CIGARETTE SMOKER 06/10/2007  . DEPRESSION 06/10/2007  . 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 POSTTRAUMATIC WOUND INFECTION NEC 06/10/2007    Past Surgical  History:  Procedure Laterality Date  . JOINT REPLACEMENT    . KNEE SURGERY         Home Medications    Prior to Admission medications   Medication Sig Start Date End Date Taking? Authorizing Provider  naproxen sodium (ANAPROX) 220 MG tablet Take 440 mg by mouth 2 (two) times daily as needed. For pain    [provider]  sulfamethoxazole-trimethoprim (BACTRIM DS,SEPTRA DS) 800-160 MG tablet Take 1 tablet by mouth 2 (two) times daily for 7 days. 02/11/17 02/18/17  Janne NapoleonNeese, Amneet Cendejas M, NP    Family History No family history on file.  Social History Social History   Tobacco Use  . Smoking status: Current Every Day Smoker    Packs/day: 0.50  . Smokeless tobacco: Never Used  Substance Use Topics  . Alcohol use: No  . Drug use: Yes    Types: Marijuana    Comment: not used in 3 weeks     Allergies   Ciprofloxacin; Hydrocodone-acetaminophen; Ibuprofen; Propoxyphene n-acetaminophen; and Tramadol hcl   Review of Systems Review of Systems  Constitutional: Negative for chills and fever.  HENT: Negative.   Eyes: Negative for visual disturbance.  Respiratory: Negative for cough and shortness of breath.   Cardiovascular: Negative for chest pain.  Gastrointestinal: Positive for nausea. Negative for abdominal pain and vomiting.  Genitourinary: Negative for dysuria, frequency and urgency.  Musculoskeletal: Positive for arthralgias. Negative for back pain and neck stiffness.  Right leg pain  Skin: Negative for rash.  Neurological: Negative for syncope, numbness and headaches.  Hematological: Positive for adenopathy.  Psychiatric/Behavioral: Negative for confusion.     Physical Exam Updated Vital Signs BP (!) 129/96 (BP Location: Left Arm)   Pulse 93   Temp 98.4 F (36.9 C) (Oral)   Resp 18   SpO2 97%   Physical Exam  Constitutional: He appears well-developed and well-nourished. No distress.  HENT:  Head: Normocephalic and atraumatic.  Eyes: EOM are normal.    Neck: Normal range of motion. Neck supple.  Cardiovascular: Normal rate and regular rhythm.  Pulmonary/Chest: Effort normal and breath sounds normal.  Abdominal: Soft. There is no tenderness.  Musculoskeletal:       Right knee: He exhibits swelling and erythema (mild). He exhibits no effusion. Decreased range of motion: due to pain. Tenderness found.       Legs: Prepatellar swelling and pain  Neurological: He is alert.  Skin: Skin is warm and dry.  Psychiatric: He has a normal mood and affect.  Nursing note and vitals reviewed.    ED Treatments / Results  Labs (all labs ordered are listed, but only abnormal results are displayed) Labs Reviewed - No data to display  Radiology Dg Knee Complete 4 Views Right  Result Date: 02/11/2017 CLINICAL DATA:  RIGHT leg pain since Sunday extending to groin, rated 9/10 EXAM: RIGHT KNEE - COMPLETE 4+ VIEW COMPARISON:  None FINDINGS: Osseous mineralization grossly normal for technique. Joint spaces preserved. No acute fracture, dislocation bone destruction. Anterior infrapatellar soft tissue swelling without acute underlying bony abnormality or radiopaque foreign body. No knee joint effusion. IMPRESSION: No acute osseous findings. Anterior infrapatellar soft tissue swelling. Electronically Signed   By: Mark  Boles M.D.   On: 02/11/2017 15:02    Procedures Procedures (including critical care time)  Medications Ordered in ED Medications  oxyCODONE-acetaminophen (PERCOCET/ROXICET) 5-325 MG per tablet 1 tablet (not administered)     Initial Impression / Assessment and Plan / ED Course  I have reviewed the triage vital signs and the nursing notes. Dr. Liu in to examine the patient. Will start Bactrim and discuss return precautions. Patient stable for d/c and does not appear toxic. No fracture, dislocation or effusion noted on x-ray. Patient will f/u with PCP or ortho. He will return here as needed.   Final Clinical Impressions(s) / ED Diagnoses    Final diagnoses:  Cellulitis of right knee    ED Discharge Orders        Ordered    sulfamethoxazole-trimethoprim (BACTRIM DS,SEPTRA DS) 800-160 MG tablet  2 times daily     11 /27/18 1546       Damian Leavelleese, PearcyHope M, NP 02/11/17 2019    Lavera GuiseLiu, Dana Duo, MD 02/14/17 1250

## 2017-02-11 NOTE — ED Triage Notes (Signed)
Patient here with c/o right leg pain started on Sunday. Pt state pain goes to his groin. Pt rating his pain at 9/10.

## 2017-06-30 DIAGNOSIS — M7051 Other bursitis of knee, right knee: Secondary | ICD-10-CM | POA: Insufficient documentation

## 2017-06-30 DIAGNOSIS — Y939 Activity, unspecified: Secondary | ICD-10-CM | POA: Diagnosis not present

## 2017-06-30 DIAGNOSIS — Z23 Encounter for immunization: Secondary | ICD-10-CM | POA: Diagnosis not present

## 2017-06-30 DIAGNOSIS — F17210 Nicotine dependence, cigarettes, uncomplicated: Secondary | ICD-10-CM | POA: Insufficient documentation

## 2017-06-30 DIAGNOSIS — I10 Essential (primary) hypertension: Secondary | ICD-10-CM | POA: Diagnosis not present

## 2017-06-30 DIAGNOSIS — M25561 Pain in right knee: Secondary | ICD-10-CM | POA: Diagnosis present

## 2017-06-30 NOTE — ED Triage Notes (Addendum)
Pt here via EMS with edema to rt knee. Pt reports symptoms x 2 weeks. Pt denies trauma. Pt is able to ambulate and uses a cane for this   160/100 HR 100, RR 16 pain 7/10

## 2017-07-01 ENCOUNTER — Emergency Department (HOSPITAL_COMMUNITY)
Admission: EM | Admit: 2017-07-01 | Discharge: 2017-07-01 | Disposition: A | Discharge status: Home / Self Care | Payer: Medicaid Other | Attending: Emergency Medicine | Admitting: Emergency Medicine

## 2017-07-01 ENCOUNTER — Other Ambulatory Visit: Payer: Self-pay

## 2017-07-01 ENCOUNTER — Emergency Department (HOSPITAL_COMMUNITY): Payer: Medicaid Other

## 2017-07-01 DIAGNOSIS — M7051 Other bursitis of knee, right knee: Secondary | ICD-10-CM

## 2017-07-01 MED ORDER — TETANUS-DIPHTH-ACELL PERTUSSIS 5-2.5-18.5 LF-MCG/0.5 IM SUSP
0.5000 mL | Freq: Once | INTRAMUSCULAR | Status: AC
Start: 2017-07-01 — End: 2017-07-01
  Administered 2017-07-01: 0.5 mL via INTRAMUSCULAR
  Filled 2017-07-01: qty 0.5

## 2017-07-01 MED ORDER — LIDOCAINE-EPINEPHRINE (PF) 2 %-1:200000 IJ SOLN
20.0000 mL | Freq: Once | INTRAMUSCULAR | Status: AC
  Administered 2017-07-01: 20 mL
  Filled 2017-07-01: qty 20

## 2017-07-01 MED ORDER — SULFAMETHOXAZOLE-TRIMETHOPRIM 800-160 MG PO TABS: 1.0000 | ORAL_TABLET | Freq: Two times a day (BID) | ORAL | 0 refills | Status: AC

## 2017-07-01 NOTE — Discharge Instructions (Addendum)
You were given a prescription for antibiotics. Please take the antibiotic prescription fully.   I have prescribed a new medication for you today. It is important that when you pick the prescription up you discuss the potential interactions of this medication with other medications you are taking, including over the counter medications, with the pharmacists.   This new medication has potential side effects. Be sure to contact your primary care provider or return to the emergency department if you are experiencing new symptoms that you are unable to tolerate after starting the medication. You need to receive medical evaluation immediately if you start to experience blistering of the skin, rash, swelling, or difficulty breathing as these signs could indicate a more serious medication side effect.   Please follow up with your primary care provider within 3 days for re-evaluation of your wound. If you do not have a primary care provider, information for a healthcare clinic has been provided for you to make arrangements for follow up care. Please return to the emergency room immediately if you experience any new or worsening symptoms or any symptoms that indicate worsening infection such as fevers, increased redness/swelling/pain, warmth, or drainage from the affected area.

## 2017-07-01 NOTE — ED Provider Notes (Addendum)
[facility]-EMERGENCY DEPT Provider Note   CSN: [id number] Arrival date & time: 06/30/17  2248     History   Chief Complaint Chief Complaint  Patient presents with  . Knee Pain    HPI [name] is a 37 y.o. male.  HPI   Patient is a 37 year old male who presents to the ED today complaining of right knee pain and swelling that began about 2 weeks ago.  Patient states symptoms started gradually and worsened about 3 days ago.  States pain is severe and constant.  It is worse with walking.  Is not tried taking any medications at home for the pain.  States he had a fall 1 month ago and fell onto the right knee however he did not have pain at that time and did not develop any symptoms and to 2 weeks later.  Has been able to ambulate with a cane at home.  Reports intermittent paresthesias to the right lower extremity but denies any numbness or weakness.  He denies any fevers.  Upon review of prior records patient was seen in the ED on 02/11/17 for cellulitis of the right knee.  He was treated with Bactrim at that time.  Past Medical History:  Diagnosis Date  . Depression   . Encephalitis   . Hypertension   . Meningitis   . 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 Stroke [facility])     Patient Active Problem List   Diagnosis Date Noted  . HCAP (healthcare-associated pneumonia) 06/28/2013  . Chest pain, atypical 06/28/2013  . SIRS (systemic inflammatory response syndrome) (HCC) 06/28/2013  . Hyponatremia 06/28/2013  . Headache 01/05/2013  . Leg pain 01/05/2013  . Dizziness 01/05/2013  . 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 CIGARETTE SMOKER 06/10/2007  . DEPRESSION 06/10/2007  . 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 POSTTRAUMATIC WOUND INFECTION NEC 06/10/2007    Past Surgical History:  Procedure Laterality Date  . JOINT REPLACEMENT    . KNEE SURGERY           Home Medications    Prior to Admission medications   Medication Sig Start Date End Date Taking? Authorizing Provider  naproxen sodium (ANAPROX) 220 MG tablet Take 440 mg by mouth 2 (two) times daily as needed. For pain   Yes [provider]  sulfamethoxazole-trimethoprim (BACTRIM DS,SEPTRA DS) 800-160 MG tablet Take 1 tablet by mouth 2 (two) times daily for 7 days. 07/01/17 07/08/17  Treyson Axel S, PA-C    Family History No family history on file.  Social History Social History   Tobacco Use  . Smoking status: Current Every Day Smoker    Packs/day: 0.50  . Smokeless tobacco: Never Used  Substance Use Topics  . Alcohol use: No  . Drug use: Yes    Types: Marijuana    Comment: not used in 3 weeks     Allergies   Ciprofloxacin; Hydrocodone-acetaminophen; Ibuprofen; Propoxyphene n-acetaminophen; and Tramadol hcl   Review of Systems Review of Systems  Constitutional: Negative for fever.  Musculoskeletal:       Left knee pain and swelling  Skin:       Left knee redness  Neurological: Negative for weakness and numbness.       Paresthesias     Physical Exam Updated Vital Signs BP (!) 117/94 (BP Location: Right Arm)   Pulse 95   Temp (!) 97.3 F (36.3 C) (Oral)  Resp 17   Ht 6\' 2"  (1.88 m)   Wt 79.8 kg (176 lb)   SpO2 100%   BMI 22.60 kg/m   Physical Exam  Constitutional: He is oriented to person, place, and time. He appears well-developed and well-nourished. No distress.  Eyes: Conjunctivae are normal.  Cardiovascular: Normal rate and regular rhythm.  Pulmonary/Chest: Effort normal and breath sounds normal.  Abdominal: Soft. There is no tenderness.  Musculoskeletal:  There is about a baseball sized area of swelling to the infrapatellar aspect of the right knee.  Area is fluctuant and warm to touch.  Appears slightly erythematous.  There is an overlying scar that is well healing.  Patient is able flex knee to greater than 45 degrees.  He is  able to extend knee to about 170 degrees however full extension is limited due to pain and swelling.  Distal pulses are intact.  Distal sensation is intact.  Neurological: He is alert and oriented to person, place, and time.  Skin: Skin is warm and dry.     ED Treatments / Results  Labs (all labs ordered are listed, but only abnormal results are displayed) Labs Reviewed  AEROBIC CULTURE (SUPERFICIAL SPECIMEN)    EKG None  Radiology Dg Knee Complete 4 Views Right  Result Date: 07/01/2017 CLINICAL DATA:  Painful and small in EXAM: RIGHT KNEE - COMPLETE 4+ VIEW COMPARISON:  02/11/2017 FINDINGS: No significant knee effusion. No fracture or malalignment. The joint spaces are within normal limits. Large soft tissue mass in the infrapatellar region measuring 13.6 by 5.4 cm. IMPRESSION: 1. No acute osseous abnormality 2. Large infrapatellar soft tissue mass or swelling measuring 13.6 x 5.4 cm. Electronically Signed   By: Jasmine PangKim  Fujinaga M.D.   On: 07/01/2017 00:57    Procedures  INCISION AND DRAINAGE .Joint Aspiration/Arthrocentesis Date/Time: 07/01/2017 9:26 AM Performed by: Karrie Meresouture, Arianne Klinge S, PA-C Authorized by: Karrie Meresouture, Deandrew Hoecker S, PA-C   Consent:    Consent obtained:  Verbal   Consent given by:  Patient   Risks discussed:  Bleeding and infection Location:    Location:  Knee   Knee:  R knee Anesthesia (see MAR for exact dosages):    Anesthesia method:  Local infiltration   Local anesthetic:  Lidocaine 2% WITH epi Procedure details:    Needle gauge: 11 blade scalpel was used.   Approach: anterior right lateral aspect of knee.   Aspirate amount:  Copious   Aspirate characteristics:  Bloody and clear Post-procedure details:    Dressing:  Gauze roll   Patient tolerance of procedure:  Tolerated well, no immediate complications   (including critical care time)  Medications Ordered in ED Medications  lidocaine-EPINEPHrine (XYLOCAINE W/EPI) 2 %-1:200000 (PF) injection 20 mL (20  mLs Infiltration Given by Other 07/01/17 0900)  Tdap (BOOSTRIX) injection 0.5 mL (0.5 mLs Intramuscular Given 07/01/17 0935)     Initial Impression / Assessment and Plan / ED Course  I have reviewed the triage vital signs and the nursing notes.  Pertinent labs & imaging results that were available during my care of the patient were reviewed by me and considered in my medical decision making (see chart for details).    Discussed pt presentation and exam findings with Dr. Patria Maneampos, who personally evaluated the pt and agrees with the plan for discharge with abx.    Final Clinical Impressions(s) / ED Diagnoses   Final diagnoses:  Infrapatellar bursitis of right knee   Patient is a 37 year old male presenting with right knee pain and  swelling has been ongoing for the last 2 weeks.  This occurred after a fall.  He is afebrile and vital signs are stable.  He is nontoxic-appearing.  He has good range of motion of the right knee and swelling and fluctuance is located to infrapatellar aspect of right knee.  X-ray of the right knee shows infrapatellar soft tissue swelling.  Do not suspect septic arthritis given patient has good range of motion, is afebrile.  The knee was drained and bloody fluid was obtained.  Wound cultures were obtained. tdap updated. Patient was placed on Bactrim and advised to follow-up with PCP for wound check in 3 days.  Advised to return earlier for new or worsening symptoms.  All questions answered patient understands plan and reasons to return.  Incision and drainage of the bursa was completed by myself and Dr. Patria Mane. Arthrocentesis was not completed and was written in error. See above for procedure details.  ED Discharge Orders        Ordered    sulfamethoxazole-trimethoprim (BACTRIM DS,SEPTRA DS) 800-160 MG tablet  2 times daily     07/01/17 0919       Karrie Meres, PA-C 07/01/17 4098    Karrie Meres, PA-C 07/01/17 1191    Karrie Meres, PA-C 07/01/17  1314    Azalia Bilis, MD 07/01/17 1416

## 2017-07-01 NOTE — ED Notes (Signed)
ED Provider at bedside. 

## 2017-07-03 LAB — AEROBIC CULTURE  (SUPERFICIAL SPECIMEN): CULTURE: NO GROWTH

## 2017-07-03 LAB — AEROBIC CULTURE W GRAM STAIN (SUPERFICIAL SPECIMEN): Gram Stain: NONE SEEN

## 2018-08-07 ENCOUNTER — Emergency Department (HOSPITAL_COMMUNITY)
Admission: EM | Admit: 2018-08-07 | Discharge: 2018-08-07 | Disposition: A | Discharge status: Home / Self Care | Payer: Medicaid Other | Attending: Emergency Medicine | Admitting: Emergency Medicine

## 2018-08-07 ENCOUNTER — Encounter (HOSPITAL_COMMUNITY): Payer: Self-pay | Admitting: Emergency Medicine

## 2018-08-07 ENCOUNTER — Other Ambulatory Visit: Payer: Self-pay

## 2018-08-07 DIAGNOSIS — F17200 Nicotine dependence, unspecified, uncomplicated: Secondary | ICD-10-CM | POA: Diagnosis not present

## 2018-08-07 DIAGNOSIS — Z8673 Personal history of transient ischemic attack (TIA), and cerebral infarction without residual deficits: Secondary | ICD-10-CM | POA: Insufficient documentation

## 2018-08-07 DIAGNOSIS — K0889 Other specified disorders of teeth and supporting structures: Secondary | ICD-10-CM | POA: Insufficient documentation

## 2018-08-07 DIAGNOSIS — I10 Essential (primary) hypertension: Secondary | ICD-10-CM | POA: Diagnosis not present

## 2018-08-07 MED ORDER — PENICILLIN V POTASSIUM 500 MG PO TABS: 500.0000 mg | ORAL_TABLET | Freq: Four times a day (QID) | ORAL | 0 refills | Status: DC

## 2018-08-07 MED ORDER — OXYCODONE HCL 5 MG PO TABS
5.0000 mg | ORAL_TABLET | Freq: Once | ORAL | Status: AC
  Administered 2018-08-07: 5 mg via ORAL
  Filled 2018-08-07: qty 1

## 2018-08-07 MED ORDER — PENICILLIN V POTASSIUM 500 MG PO TABS
500.0000 mg | ORAL_TABLET | Freq: Once | ORAL | Status: AC
  Administered 2018-08-07: 500 mg via ORAL
  Filled 2018-08-07: qty 1

## 2018-08-07 NOTE — ED Notes (Signed)
Bed: [facility] Expected date:  Expected time:  Means of arrival:  Comments: 

## 2018-08-07 NOTE — ED Provider Notes (Signed)
[facility]-EMERGENCY DEPT Provider Note   CSN: [id number] Arrival date & time: 08/07/18  2116    History   Chief Complaint Chief Complaint  Patient presents with  . Dental Pain    HPI [name] is a 38 y.o. male.     Patient presents to the emergency department with a chief complaint of dental pain.  He states that the pain started yesterday.  He has a broken tooth, but is uncertain when he broke it.  He reports having an appointment with the dentist next month.  He rates his pain is severe.  He has not taken anything for symptoms.  The history is provided by the patient. No language interpreter was used.    Past Medical History:  Diagnosis Date  . Depression   . Encephalitis   . Hypertension   . Meningitis   . 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 Stroke [facility])     Patient Active Problem List   Diagnosis Date Noted  . HCAP (healthcare-associated pneumonia) 06/28/2013  . Chest pain, atypical 06/28/2013  . SIRS (systemic inflammatory response syndrome) (HCC) 06/28/2013  . Hyponatremia 06/28/2013  . Headache 01/05/2013  . Leg pain 01/05/2013  . Dizziness 01/05/2013  . 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 CIGARETTE SMOKER 06/10/2007  . DEPRESSION 06/10/2007  . 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 POSTTRAUMATIC WOUND INFECTION NEC 06/10/2007    Past Surgical History:  Procedure Laterality Date  . JOINT REPLACEMENT    . KNEE SURGERY          Home Medications    Prior to Admission medications   Medication Sig Start Date End Date Taking? Authorizing Provider  naproxen sodium (ANAPROX) 220 MG tablet Take 440 mg by mouth 2 (two) times daily as needed. For pain    [provider]  penicillin v potassium (VEETID) 500 MG tablet Take 1 tablet (500 mg total) by mouth 4 (four) times daily. 08/07/18   Roxy Horseman, PA-C     Family History No family history on file.  Social History Social History   Tobacco Use  . Smoking status: Current Every Day Smoker    Packs/day: 0.50  . Smokeless tobacco: Never Used  Substance Use Topics  . Alcohol use: No  . Drug use: Yes    Types: Marijuana    Comment: not used in 3 weeks     Allergies   Ciprofloxacin; Hydrocodone-acetaminophen; Ibuprofen; Propoxyphene n-acetaminophen; and Tramadol hcl   Review of Systems Review of Systems  All other systems reviewed and are negative.    Physical Exam Updated Vital Signs BP 139/78 (BP Location: Right Arm)   Pulse (!) 114   Temp 98.7 F (37.1 C) (Oral)   Resp 18   SpO2 100%   Physical Exam Physical Exam  Constitutional: Pt appears well-developed and well-nourished.  HENT:  Head: Normocephalic.  Right Ear: Tympanic membrane, external ear and ear canal normal.  Left Ear: Tympanic membrane, external ear and ear canal normal.  Nose: Nose normal. Right sinus exhibits no maxillary sinus tenderness and no frontal sinus tenderness. Left sinus exhibits no maxillary sinus tenderness and no frontal sinus tenderness.  Mouth/Throat: Uvula is midline, oropharynx is clear and moist and mucous membranes are normal. No oral lesions. No uvula swelling or lacerations. No oropharyngeal exudate, posterior oropharyngeal edema, posterior oropharyngeal erythema or tonsillar abscesses.  Poor dentition No gingival swelling, fluctuance or induration  No gross abscess  No sublingual edema, tenderness to palpation, or sign of Ludwig's angina, or deep space infection Pain at left lower rear molar Cracked tooth Eyes: Conjunctivae are normal. Pupils are equal, round, and reactive to light. Right eye exhibits no discharge. Left eye exhibits no discharge.  Neck: Normal range of motion. Neck supple.  No stridor Handling secretions without difficulty No nuchal rigidity No cervical lymphadenopathy Cardiovascular: Normal rate, regular rhythm  and normal heart sounds.   Pulmonary/Chest: Effort normal. No respiratory distress.  Equal chest rise  Abdominal: Soft. Bowel sounds are normal. Pt exhibits no distension. There is no tenderness.  Lymphadenopathy: Pt has no cervical adenopathy.  Neurological: Pt is alert and oriented x 4  Skin: Skin is warm and dry.  Psychiatric: Pt has a normal mood and affect.  Nursing note and vitals reviewed.    ED Treatments / Results  Labs (all labs ordered are listed, but only abnormal results are displayed) Labs Reviewed - No data to display  EKG None  Radiology No results found.  Procedures Procedures (including critical care time)  Medications Ordered in ED Medications  oxyCODONE (Oxy IR/ROXICODONE) immediate release tablet 5 mg (has no administration in time range)  penicillin v potassium (VEETID) tablet 500 mg (has no administration in time range)     Initial Impression / Assessment and Plan / ED Course  I have reviewed the triage vital signs and the nursing notes.  Pertinent labs & imaging results that were available during my care of the patient were reviewed by me and considered in my medical decision making (see chart for details).        Patient with dentalgia.  No abscess requiring immediate incision and drainage.  Exam not concerning for Ludwig's angina or pharyngeal abscess.  Will treat with penicillin. Pt instructed to follow-up with dentist.  Discussed return precautions. Pt safe for discharge.   Final Clinical Impressions(s) / ED Diagnoses   Final diagnoses:  Pain, dental    ED Discharge Orders         Ordered    penicillin v potassium (VEETID) 500 MG tablet  4 times daily     08/07/18 2306           Roxy HorsemanBrowning, Tanae Petrosky, PA-C 08/07/18 2309    Dione BoozeGlick, David, MD 08/08/18 [phone]

## 2018-08-07 NOTE — ED Triage Notes (Signed)
Patient c/o left lower dental pain since yesterday. Reports broken tooth. Appointment on 6/3.

## 2018-10-01 ENCOUNTER — Ambulatory Visit (HOSPITAL_COMMUNITY)
Admission: EM | Admit: 2018-10-01 | Discharge: 2018-10-01 | Disposition: A | Discharge status: Home / Self Care | Payer: Medicaid Other

## 2018-10-01 ENCOUNTER — Other Ambulatory Visit: Payer: Self-pay

## 2018-10-01 ENCOUNTER — Encounter (HOSPITAL_COMMUNITY): Payer: Self-pay

## 2018-10-01 DIAGNOSIS — K047 Periapical abscess without sinus: Secondary | ICD-10-CM

## 2018-10-01 MED ORDER — AMOXICILLIN-POT CLAVULANATE 875-125 MG PO TABS: 1.0000 | ORAL_TABLET | Freq: Two times a day (BID) | ORAL | 0 refills | Status: DC

## 2018-10-01 NOTE — Discharge Instructions (Signed)
Take the medication as prescribed Follow up with the dentist  Aleve or tylenol for pain

## 2018-10-01 NOTE — ED Triage Notes (Signed)
Patient presents to Urgent Care with complaints of left lower dental pain since one of his molars partially broke off a few weeks ago. Pt reports going to [facility] for same and states he had an allergic reaction to the penicillin he was given (headache, upset stomach, and hives).

## 2018-10-04 NOTE — ED Provider Notes (Signed)
[facility]    CSN: [id number] Arrival date & time: 10/01/18  1419      History   Chief Complaint Chief Complaint  Patient presents with  . Dental Pain    HPI [name] is a 38 y.o. male.   Patient is a 38 year old male the presents today with left lower dental pain.  Reporting this started a few weeks ago when his molar broke off.  He went to [facility] long ED for same and had allergic reaction to penicillin he was given with headache, upset stomach and hives.  He did not finish medication.  He has been having continued dental pain.  Denies any associated fevers, chills, body aches, night sweats.  Does not have a dentist.  No trouble swallowing, drooling or opening of the mouth.  He has been taking Aleve for pain  ROS per HPI      Past Medical History:  Diagnosis Date  . Depression   . Encephalitis   . Hypertension   . Meningitis   . 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 Stroke [facility])     Patient Active Problem List   Diagnosis Date Noted  . HCAP (healthcare-associated pneumonia) 06/28/2013  . Chest pain, atypical 06/28/2013  . SIRS (systemic inflammatory response syndrome) ([place]) 06/28/2013  . Hyponatremia 06/28/2013  . Headache 01/05/2013  . Leg pain 01/05/2013  . Dizziness 01/05/2013  . 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 CIGARETTE SMOKER 06/10/2007  . DEPRESSION 06/10/2007  . 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 POSTTRAUMATIC WOUND INFECTION NEC 06/10/2007    Past Surgical History:  Procedure Laterality Date  . JOINT REPLACEMENT    . KNEE SURGERY         Home Medications    Prior to Admission medications   Medication Sig Start Date End Date Taking? Authorizing Provider  amoxicillin-clavulanate (AUGMENTIN) 875-125 MG tablet Take 1 tablet by mouth every 12 (twelve) hours. 10/01/18   Orvan July, NP  cyclobenzaprine (FLEXERIL) 10 MG  tablet  06/26/18   [provider]  lisinopril (ZESTRIL) 10 MG tablet lisinopril 10 mg tablet    [provider]  naproxen sodium (ANAPROX) 220 MG tablet Take 440 mg by mouth 2 (two) times daily as needed. For pain    [provider]  oxybutynin (DITROPAN) 5 MG tablet  06/21/18   [provider]  penicillin v potassium (VEETID) 500 MG tablet Take 1 tablet (500 mg total) by mouth 4 (four) times daily. 08/07/18   Montine Circle, PA-C    Family History Family History  Problem Relation Age of Onset  . Hypertension Mother   . Hypertension Father     Social History Social History   Tobacco Use  . Smoking status: Current Every Day Smoker    Packs/day: 0.25  . Smokeless tobacco: Never Used  Substance Use Topics  . Alcohol use: No  . Drug use: Yes    Types: Marijuana    Comment: not used in 3 weeks     Allergies   Ciprofloxacin, Hydrocodone-acetaminophen, Ibuprofen, Penicillins, Propoxyphene n-acetaminophen, and Tramadol hcl   Review of Systems Review of Systems   Physical Exam Triage Vital Signs ED Triage Vitals  Enc Vitals Group     BP 10/01/18 1436 136/87     Pulse Rate 10/01/18 1436 99     Resp 10/01/18 1436 17     Temp 10/01/18 1436 98.1 F (  36.7 C)     Temp Source 10/01/18 1436 Oral     SpO2 10/01/18 1436 100 %     Weight --      Height --      Head Circumference --      Peak Flow --      Pain Score 10/01/18 1433 9     Pain Loc --      Pain Edu? --      Excl. in GC? --    No data found.  Updated Vital Signs BP 136/87 (BP Location: Right Arm)   Pulse 99   Temp 98.1 F (36.7 C) (Oral)   Resp 17   SpO2 100%   Visual Acuity Right Eye Distance:   Left Eye Distance:   Bilateral Distance:    Right Eye Near:   Left Eye Near:    Bilateral Near:     Physical Exam Vitals signs and nursing note reviewed.  Constitutional:      Appearance: Normal appearance.  HENT:     Head: Normocephalic and atraumatic.     Nose: Nose  normal.     Mouth/Throat:     Dentition: Abnormal dentition. Dental tenderness, gingival swelling and dental caries present. No dental abscesses.  Eyes:     Conjunctiva/sclera: Conjunctivae normal.  Neck:     Musculoskeletal: Normal range of motion.  Pulmonary:     Effort: Pulmonary effort is normal.  Abdominal:     Palpations: Abdomen is soft.     Tenderness: There is no abdominal tenderness.  Musculoskeletal: Normal range of motion.  Skin:    General: Skin is warm and dry.  Neurological:     Mental Status: He is alert.  Psychiatric:        Mood and Affect: Mood normal.      UC Treatments / Results  Labs (all labs ordered are listed, but only abnormal results are displayed) Labs Reviewed - No data to display  EKG   Radiology No results found.  Procedures Procedures (including critical care time)  Medications Ordered in UC Medications - No data to display  Initial Impression / Assessment and Plan / UC Course  I have reviewed the triage vital signs and the nursing notes.  Pertinent labs & imaging results that were available during my care of the patient were reviewed by me and considered in my medical decision making (see chart for details).    Treating for dental infection with Augmentin based on previous penicillin allergy. Hopefully he will be able to finish this and get rid of the infection. Aleve as needed for pain Dental resources given Final Clinical Impressions(s) / UC Diagnoses   Final diagnoses:  Dental infection     Discharge Instructions     Take the medication as prescribed Follow up with the dentist  Aleve or tylenol for pain    ED Prescriptions    Medication Sig Dispense Auth. Provider   amoxicillin-clavulanate (AUGMENTIN) 875-125 MG tablet Take 1 tablet by mouth every 12 (twelve) hours. 14 tablet Janace ArisBast, Terrence Pizana A, NP     Controlled Substance Prescriptions [place] Controlled Substance Registry consulted? no   Janace ArisBast, Leeam Cedrone A, NP 10/04/18  1916

## 2018-11-16 ENCOUNTER — Emergency Department (HOSPITAL_COMMUNITY): Payer: Medicaid Other

## 2018-11-16 ENCOUNTER — Inpatient Hospital Stay (HOSPITAL_COMMUNITY)
Admission: EM | Admit: 2018-11-16 | Discharge: 2018-11-17 | DRG: 540 | Discharge status: Against Medical Advice | Payer: Medicaid Other | Attending: Internal Medicine | Admitting: Internal Medicine

## 2018-11-16 ENCOUNTER — Encounter (HOSPITAL_COMMUNITY): Payer: Self-pay

## 2018-11-16 DIAGNOSIS — I10 Essential (primary) hypertension: Secondary | ICD-10-CM | POA: Diagnosis present

## 2018-11-16 DIAGNOSIS — Z8673 Personal history of transient ischemic attack (TIA), and cerebral infarction without residual deficits: Secondary | ICD-10-CM

## 2018-11-16 DIAGNOSIS — F17210 Nicotine dependence, cigarettes, uncomplicated: Secondary | ICD-10-CM | POA: Diagnosis present

## 2018-11-16 DIAGNOSIS — F17200 Nicotine dependence, unspecified, uncomplicated: Secondary | ICD-10-CM | POA: Diagnosis not present

## 2018-11-16 DIAGNOSIS — Z79899 Other long term (current) drug therapy: Secondary | ICD-10-CM | POA: Diagnosis not present

## 2018-11-16 DIAGNOSIS — D72829 Elevated white blood cell count, unspecified: Secondary | ICD-10-CM | POA: Diagnosis present

## 2018-11-16 DIAGNOSIS — E871 Hypo-osmolality and hyponatremia: Secondary | ICD-10-CM | POA: Diagnosis present

## 2018-11-16 DIAGNOSIS — Z20828 Contact with and (suspected) exposure to other viral communicable diseases: Secondary | ICD-10-CM | POA: Diagnosis present

## 2018-11-16 DIAGNOSIS — Z8661 Personal history of infections of the central nervous system: Secondary | ICD-10-CM | POA: Diagnosis not present

## 2018-11-16 DIAGNOSIS — M869 Osteomyelitis, unspecified: Principal | ICD-10-CM | POA: Diagnosis present

## 2018-11-16 LAB — COMPREHENSIVE METABOLIC PANEL
ALT: 25 U/L (ref 0–44)
AST: 27 U/L (ref 15–41)
Albumin: 3.9 g/dL (ref 3.5–5.0)
Alkaline Phosphatase: 84 U/L (ref 38–126)
Anion gap: 11 (ref 5–15)
BUN: 20 mg/dL (ref 6–20)
CO2: 22 mmol/L (ref 22–32)
Calcium: 9 mg/dL (ref 8.9–10.3)
Chloride: 98 mmol/L (ref 98–111)
Creatinine, Ser: 1.1 mg/dL (ref 0.61–1.24)
GFR calc Af Amer: >60 mL/min (ref >60)
GFR calc non Af Amer: >60 mL/min (ref >60)
Glucose, Bld: 106 mg/dL — ABNORMAL HIGH (ref 70–99)
Potassium: 5.1 mmol/L (ref 3.5–5.1)
Sodium: 131 mmol/L — ABNORMAL LOW (ref 135–145)
Total Bilirubin: 1.6 mg/dL — ABNORMAL HIGH (ref 0.3–1.2)
Total Protein: 8.7 g/dL — ABNORMAL HIGH (ref 6.5–8.1)

## 2018-11-16 LAB — CBC WITH DIFFERENTIAL/PLATELET
Band Neutrophils: 6 %
Basophils Absolute: 0.2 10*3/uL — ABNORMAL HIGH (ref 0.0–0.1)
Basophils Relative: 1 %
Blasts: 0 %
Eosinophils Absolute: 0 10*3/uL (ref 0.0–0.5)
Eosinophils Relative: 0 %
HCT: 42.4 % (ref 39.0–52.0)
Hemoglobin: 14.4 g/dL (ref 13.0–17.0)
Lymphocytes Relative: 20 %
Lymphs Abs: 3.9 10*3/uL (ref 0.7–4.0)
MCH: 28.3 pg (ref 26.0–34.0)
MCHC: 34 g/dL (ref 30.0–36.0)
MCV: 83.3 fL (ref 80.0–100.0)
Metamyelocytes Relative: 0 %
Monocytes Absolute: 1.9 10*3/uL — ABNORMAL HIGH (ref 0.1–1.0)
Monocytes Relative: 10 %
Myelocytes: 0 %
Neutro Abs: 13.4 10*3/uL — ABNORMAL HIGH (ref 1.7–7.7)
Neutrophils Relative %: 63 %
Other: 0 %
Platelets: 402 10*3/uL — ABNORMAL HIGH (ref 150–400)
Promyelocytes Relative: 0 %
RBC: 5.09 MIL/uL (ref 4.22–5.81)
RDW: 13.2 % (ref 11.5–15.5)
WBC: 19.4 10*3/uL — ABNORMAL HIGH (ref 4.0–10.5)
nRBC: 0 % (ref 0.0–0.2)
nRBC: 0 /100 WBC

## 2018-11-16 LAB — SEDIMENTATION RATE: Sed Rate: 47 mm/hr — ABNORMAL HIGH (ref 0–16)

## 2018-11-16 LAB — C-REACTIVE PROTEIN: CRP: 13.9 mg/dL — ABNORMAL HIGH (ref <1.0)

## 2018-11-16 MED ORDER — SODIUM CHLORIDE 0.9 % IV SOLN
INTRAVENOUS | Status: AC
  Administered 2018-11-17: via INTRAVENOUS

## 2018-11-16 MED ORDER — ACETAMINOPHEN 650 MG RE SUPP: 650.0000 mg | Freq: Four times a day (QID) | RECTAL | Status: DC | PRN

## 2018-11-16 MED ORDER — ACETAMINOPHEN 325 MG PO TABS: 650.0000 mg | ORAL_TABLET | Freq: Four times a day (QID) | ORAL | Status: DC | PRN

## 2018-11-16 MED ORDER — VANCOMYCIN HCL 10 G IV SOLR
2000.0000 mg | Freq: Once | INTRAVENOUS | Status: AC
  Administered 2018-11-16: 2000 mg via INTRAVENOUS
  Filled 2018-11-16: qty 2000

## 2018-11-16 MED ORDER — VANCOMYCIN HCL IN DEXTROSE 1-5 GM/200ML-% IV SOLN: 1000.0000 mg | Freq: Once | INTRAVENOUS | Status: DC

## 2018-11-16 MED ORDER — ENOXAPARIN SODIUM 40 MG/0.4ML ~~LOC~~ SOLN: 40.0000 mg | SUBCUTANEOUS | Status: DC

## 2018-11-16 MED ORDER — SODIUM CHLORIDE 0.9 % IV SOLN
2.0000 g | Freq: Three times a day (TID) | INTRAVENOUS | Status: DC
  Administered 2018-11-17 (×2): 2 g via INTRAVENOUS
  Filled 2018-11-16 (×2): qty 2

## 2018-11-16 NOTE — Progress Notes (Signed)
Pharmacy Antibiotic Note  [name] is a 38 y.o. male admitted on 11/16/2018 with osteomyelitis.  Pharmacy has been consulted for cefepime and vancomycin dosing.  Plan: Cefepime 2 Gm IV q8h Vancomycin 2 gm x1 then 1500 mg IV q12h for est AUC = 523 Goal AUC = 400-550 F/u scr/cultures/levels  Height: 6\' 1"  (185.4 cm) Weight: 197 lb (89.4 kg) IBW/kg (Calculated) : 79.9  Temp (24hrs), Avg:99.6 F (37.6 C), Min:99.6 F (37.6 C), Max:99.6 F (37.6 C)  Recent Labs  Lab 11/16/18 1724  WBC 19.4*  CREATININE 1.10    Estimated Creatinine Clearance: 101.9 mL/min (by C-G formula based on SCr of 1.1 mg/dL).    Allergies  Allergen Reactions  . Ciprofloxacin Other (See Comments)    Reaction=hypertension  . Hydrocodone-Acetaminophen Hives  . Ibuprofen Hives  . Penicillins Hives and Nausea Only    Did it involve swelling of the face/tongue/throat, SOB, or low BP? N Did it involve sudden or severe rash/hives, skin peeling, or any reaction on the inside of your mouth or nose? N Did you need to seek medical attention at a hospital or doctor's office? N When did it last happen?3 months ago If all above answers are "NO", may proceed with cephalosporin use.   Marland Kitchen Propoxyphene N-Acetaminophen Hives  . Tramadol Hcl Hives    Antimicrobials this admission: 8/31 cefepime >>  8/31 vancomycin >>   Dose adjustments this admission:   Microbiology results:  BCx:   UCx:    Sputum:    MRSA PCR:   Thank you for allowing pharmacy to be a part of this patient's care.  Dorrene German 11/16/2018 11:08 PM

## 2018-11-16 NOTE — ED Triage Notes (Signed)
Transported by [facility] from home-- recurrent infections to left knee. Patient has experienced multiple surgeries to left knee. +hot/painful to touch.   BP 180/100 HR 120 (per EMS)

## 2018-11-16 NOTE — ED Notes (Signed)
Pt transported to MRI 

## 2018-11-16 NOTE — ED Provider Notes (Signed)
[facility] DEPT Provider Note   CSN: [id number] Arrival date & time: 11/16/18  1555     History   Chief Complaint Chief Complaint  Patient presents with  . Knee Problem    HPI [name] is a 38 y.o. male.     HPI Patient has chronic wound to the left proximal tibia for which he has been followed by orthopedics.  States he has not seen his doctor for 5 years.  States the wound is intermittently open and draining.  Has been so for the last 2 weeks.  Denies any new pain or trauma.  Admits to subjective chills. Past Medical History:  Diagnosis Date  . Depression   . Encephalitis   . Hypertension   . Meningitis   . 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 Stroke [facility])     Patient Active Problem List   Diagnosis Date Noted  . Leukocytosis 11/17/2018  . Osteomyelitis ([place]) 11/16/2018  . HCAP (healthcare-associated pneumonia) 06/28/2013  . Chest pain, atypical 06/28/2013  . SIRS (systemic inflammatory response syndrome) ([place]) 06/28/2013  . Hyponatremia 06/28/2013  . Headache 01/05/2013  . Leg pain 01/05/2013  . Dizziness 01/05/2013  . 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 CIGARETTE SMOKER 06/10/2007  . DEPRESSION 06/10/2007  . 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 POSTTRAUMATIC WOUND INFECTION NEC 06/10/2007    Past Surgical History:  Procedure Laterality Date  . JOINT REPLACEMENT    . KNEE SURGERY          Home Medications    Prior to Admission medications   Medication Sig Start Date End Date Taking? Authorizing Provider  cyclobenzaprine (FLEXERIL) 10 MG tablet Take 10 mg by mouth daily as needed for muscle spasms.  06/26/18  Yes [provider]  lisinopril (ZESTRIL) 10 MG tablet Take 10 mg by mouth daily.    Yes [provider]  naproxen sodium (ANAPROX) 220 MG tablet Take 440 mg by mouth 2 (two) times daily  as needed. For pain   Yes [provider]  oxybutynin (DITROPAN) 5 MG tablet Take 5 mg by mouth daily.  06/21/18  Yes [provider]  amoxicillin-clavulanate (AUGMENTIN) 875-125 MG tablet Take 1 tablet by mouth every 12 (twelve) hours. Patient not taking: Reported on 11/16/2018 10/01/18   Loura Halt A, NP  penicillin v potassium (VEETID) 500 MG tablet Take 1 tablet (500 mg total) by mouth 4 (four) times daily. Patient not taking: Reported on 11/16/2018 08/07/18   Montine Circle, PA-C    Family History Family History  Problem Relation Age of Onset  . Hypertension Mother   . Hypertension Father     Social History Social History   Tobacco Use  . Smoking status: Current Every Day Smoker    Packs/day: 0.25  . Smokeless tobacco: Never Used  Substance Use Topics  . Alcohol use: No  . Drug use: Yes    Types: Marijuana    Comment: not used in 3 weeks     Allergies   Ciprofloxacin, Hydrocodone-acetaminophen, Ibuprofen, Penicillins, Propoxyphene n-acetaminophen, and Tramadol hcl   Review of Systems Review of Systems  Constitutional: Positive for chills. Negative for fever.  Respiratory: Negative for cough and shortness of breath.   Cardiovascular: Negative for chest pain.  Gastrointestinal: Negative for abdominal pain, diarrhea, nausea and vomiting.  Musculoskeletal: Negative for arthralgias and joint swelling.  Skin: Positive for wound.  Neurological: Negative  for weakness and numbness.  All other systems reviewed and are negative.    Physical Exam Updated Vital Signs BP 129/87   Pulse 77   Temp 99 F (37.2 C)   Resp 16   Ht 6\' 1"  (1.854 m)   Wt 89.4 kg   SpO2 97%   BMI 25.99 kg/m   Physical Exam Vitals signs and nursing note reviewed.  Constitutional:      Appearance: Normal appearance. He is well-developed.  HENT:     Head: Normocephalic and atraumatic.  Eyes:     Pupils: Pupils are equal, round, and reactive to light.  Neck:      Musculoskeletal: Normal range of motion and neck supple.  Cardiovascular:     Rate and Rhythm: Normal rate and regular rhythm.     Heart sounds: No murmur. No friction rub. No gallop.   Pulmonary:     Effort: Pulmonary effort is normal. No respiratory distress.     Breath sounds: Normal breath sounds. No stridor. No wheezing, rhonchi or rales.  Chest:     Chest wall: No tenderness.  Abdominal:     General: Bowel sounds are normal.     Palpations: Abdomen is soft.     Tenderness: There is no abdominal tenderness. There is no guarding or rebound.  Musculoskeletal: Normal range of motion.        General: No tenderness.     Comments: Patient with full range of motion of the left knee without obvious effusion.  No pain with range of motion.  No erythema or warmth.  Patient does have a chronic appearing open wound to the anterior proximal tibia.  There is some serous drainage without obvious purulence.  Patient does have very mild surrounding erythema.  Skin:    General: Skin is warm and dry.     Findings: No erythema or rash.  Neurological:     Mental Status: He is alert and oriented to person, place, and time.  Psychiatric:        Behavior: Behavior normal.      ED Treatments / Results  Labs (all labs ordered are listed, but only abnormal results are displayed) Labs Reviewed  CBC WITH DIFFERENTIAL/PLATELET - Abnormal; Notable for the following components:      Result Value   WBC 19.4 (*)    Platelets 402 (*)    Neutro Abs 13.4 (*)    Monocytes Absolute 1.9 (*)    Basophils Absolute 0.2 (*)    All other components within normal limits  COMPREHENSIVE METABOLIC PANEL - Abnormal; Notable for the following components:   Sodium 131 (*)    Glucose, Bld 106 (*)    Total Protein 8.7 (*)    Total Bilirubin 1.6 (*)    All other components within normal limits  C-REACTIVE PROTEIN - Abnormal; Notable for the following components:   CRP 13.9 (*)    All other components within normal  limits  SEDIMENTATION RATE - Abnormal; Notable for the following components:   Sed Rate 47 (*)    All other components within normal limits  COMPREHENSIVE METABOLIC PANEL - Abnormal; Notable for the following components:   Sodium 132 (*)    BUN 21 (*)    All other components within normal limits  CBC - Abnormal; Notable for the following components:   WBC 18.4 (*)    Platelets 413 (*)    All other components within normal limits  AEROBIC CULTURE (SUPERFICIAL SPECIMEN)  CULTURE, BLOOD (ROUTINE X  2)  CULTURE, BLOOD (ROUTINE X 2)  SARS CORONAVIRUS 2 (HOSPITAL ORDER, PERFORMED IN [facility])  HIV ANTIBODY (ROUTINE TESTING W REFLEX)    EKG None  Radiology No results found.  Procedures Procedures (including critical care time)  Medications Ordered in ED Medications  0.9 %  sodium chloride infusion ( Intravenous Stopped 11/17/18 0902)  vancomycin (VANCOCIN) 2,000 mg in sodium chloride 0.9 % 500 mL IVPB (0 mg Intravenous Stopped 11/17/18 0149)     Initial Impression / Assessment and Plan / ED Course  I have reviewed the triage vital signs and the nursing notes.  Pertinent labs & imaging results that were available during my care of the patient were reviewed by me and considered in my medical decision making (see chart for details).        Question repeat wound infection.  Low suspicion for septic joint.  Inflammatory markers are elevated.  Discussed MRI results with radiology.  Concerning for early osteomyelitis.  No drainable abscesses.  Will obtain blood cultures.  Will start on IV vancomycin discussed with hospitalist. Final Clinical Impressions(s) / ED Diagnoses   Final diagnoses:  Osteomyelitis of left tibia, unspecified type [facility])    ED Discharge Orders    None       Loren Racer, MD 11/20/18 [phone]

## 2018-11-17 DIAGNOSIS — D72829 Elevated white blood cell count, unspecified: Secondary | ICD-10-CM | POA: Diagnosis present

## 2018-11-17 LAB — CBC
HCT: 41.7 % (ref 39.0–52.0)
Hemoglobin: 14 g/dL (ref 13.0–17.0)
MCH: 28.2 pg (ref 26.0–34.0)
MCHC: 33.6 g/dL (ref 30.0–36.0)
MCV: 83.9 fL (ref 80.0–100.0)
Platelets: 413 10*3/uL — ABNORMAL HIGH (ref 150–400)
RBC: 4.97 MIL/uL (ref 4.22–5.81)
RDW: 13.4 % (ref 11.5–15.5)
WBC: 18.4 10*3/uL — ABNORMAL HIGH (ref 4.0–10.5)
nRBC: 0 % (ref 0.0–0.2)

## 2018-11-17 LAB — COMPREHENSIVE METABOLIC PANEL
ALT: 24 U/L (ref 0–44)
AST: 22 U/L (ref 15–41)
Albumin: 3.5 g/dL (ref 3.5–5.0)
Alkaline Phosphatase: 76 U/L (ref 38–126)
Anion gap: 10 (ref 5–15)
BUN: 21 mg/dL — ABNORMAL HIGH (ref 6–20)
CO2: 22 mmol/L (ref 22–32)
Calcium: 8.9 mg/dL (ref 8.9–10.3)
Chloride: 100 mmol/L (ref 98–111)
Creatinine, Ser: 0.91 mg/dL (ref 0.61–1.24)
GFR calc Af Amer: >60 mL/min (ref >60)
GFR calc non Af Amer: >60 mL/min (ref >60)
Glucose, Bld: 95 mg/dL (ref 70–99)
Potassium: 4.1 mmol/L (ref 3.5–5.1)
Sodium: 132 mmol/L — ABNORMAL LOW (ref 135–145)
Total Bilirubin: 1 mg/dL (ref 0.3–1.2)
Total Protein: 7.8 g/dL (ref 6.5–8.1)

## 2018-11-17 LAB — HIV ANTIBODY (ROUTINE TESTING W REFLEX): HIV Screen 4th Generation wRfx: NONREACTIVE

## 2018-11-17 LAB — SARS CORONAVIRUS 2 BY RT PCR (HOSPITAL ORDER, PERFORMED IN ~~LOC~~ HOSPITAL LAB): SARS Coronavirus 2: NEGATIVE

## 2018-11-17 MED ORDER — LISINOPRIL 10 MG PO TABS: 10.0000 mg | ORAL_TABLET | Freq: Every day | ORAL | Status: DC

## 2018-11-17 MED ORDER — VANCOMYCIN HCL 10 G IV SOLR: 1500.0000 mg | Freq: Two times a day (BID) | INTRAVENOUS | Status: DC

## 2018-11-17 MED ORDER — CYCLOBENZAPRINE HCL 10 MG PO TABS: 10.0000 mg | ORAL_TABLET | Freq: Every day | ORAL | Status: DC | PRN

## 2018-11-17 MED ORDER — OXYBUTYNIN CHLORIDE 5 MG PO TABS: 5.0000 mg | ORAL_TABLET | Freq: Every day | ORAL | Status: DC

## 2018-11-17 NOTE — ED Notes (Signed)
Pt provided with Chicken Noodle Soup and Apple Juice.

## 2018-11-17 NOTE — Discharge Summary (Signed)
   Notified by RN patient leaving AMA. He left prior to my evaluation this morning.    Dessa Phi, DO [facility] [email or web address] 11/17/2018, 9:06 AM

## 2018-11-17 NOTE — H&P (Signed)
TRH H&P    Patient Demographics:    [name], is a 38 y.o. male  MRN: [id number]  DOB - 08/21/1980  Admit Date - 11/16/2018  Referring MD/NP/PA:  Julianne Rice  Outpatient Primary MD for the patient is Bartholome Bill, MD  Patient coming from:  home  Chief complaint-  osteomyelitis   HPI:    [name]  is a 38 y.o. male, hypertension, h/o CVA, meningitis/ ecephalitis   w knee infection (multiple), presents with 2 weeks of drainage from open wound just below the left knee.    In ED,  T 99.6, P 104, Bp 159/95  Pox 97% on RA  Xray  IMPRESSION: 1. No acute displaced fracture or dislocation. 2. Pretibial soft tissue swelling with evidence for an associated ulcer. 3. Erosion of the tibial tubercle which appears slightly progressed across prior studies. This could be secondary to underlying osteomyelitis. MRI may be useful for further evaluation of this finding.   MRI Tib / fib,  Per ED, radiology said probably has osteomyelitis  Wbc 19.4, Hgb 14.4, Plt 402 Na 131, K 5.1, Bun 20, Creatinine 1.1 ASt 27, Alt 25 ESR 47 Crp 13.9  Blood culture x2 pending  Pt will be admitted for wound just below left knee as well as ? osteomyelitis   Review of systems:    In addition to the HPI above,  No Fever-chills, No Headache, No changes with Vision or hearing, No problems swallowing food or Liquids, No Chest pain, Cough or Shortness of Breath, No Abdominal pain, No Nausea or Vomiting, bowel movements are regular, No Blood in stool or Urine, No dysuria, No new skin rashes or bruises,   No new weakness, tingling, numbness in any extremity, No recent weight gain or loss, No polyuria, polydypsia or polyphagia, No significant Mental Stressors.  All other systems reviewed and are negative.    Past History of the following :    Past Medical History:  Diagnosis Date  . Depression    . Encephalitis   . Hypertension   . Meningitis   . 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 Stroke [facility])       Past Surgical History:  Procedure Laterality Date  . JOINT REPLACEMENT    . KNEE SURGERY        Social History:      Social History   Tobacco Use  . Smoking status: Current Every Day Smoker    Packs/day: 0.25  . Smokeless tobacco: Never Used  Substance Use Topics  . Alcohol use: No       Family History :     Family History  Problem Relation Age of Onset  . Hypertension Mother   . Hypertension Father        Home Medications:   Prior to Admission medications   Medication Sig Start Date End Date Taking? Authorizing Provider  cyclobenzaprine (FLEXERIL) 10 MG tablet Take 10 mg by mouth daily as needed for muscle spasms.  06/26/18  Yes  [provider]  lisinopril (ZESTRIL) 10 MG tablet Take 10 mg by mouth daily.    Yes [provider]  naproxen sodium (ANAPROX) 220 MG tablet Take 440 mg by mouth 2 (two) times daily as needed. For pain   Yes [provider]  oxybutynin (DITROPAN) 5 MG tablet Take 5 mg by mouth daily.  06/21/18  Yes [provider]  amoxicillin-clavulanate (AUGMENTIN) 875-125 MG tablet Take 1 tablet by mouth every 12 (twelve) hours. Patient not taking: Reported on 11/16/2018 10/01/18   Loura Halt A, NP  penicillin v potassium (VEETID) 500 MG tablet Take 1 tablet (500 mg total) by mouth 4 (four) times daily. Patient not taking: Reported on 11/16/2018 08/07/18   Montine Circle, PA-C     Allergies:     Allergies  Allergen Reactions  . Ciprofloxacin Other (See Comments)    Reaction=hypertension  . Hydrocodone-Acetaminophen Hives  . Ibuprofen Hives  . Penicillins Hives and Nausea Only    Did it involve swelling of the face/tongue/throat, SOB, or low BP? N Did it involve sudden or severe rash/hives, skin peeling, or any reaction on the inside of your mouth or nose?  N Did you need to seek medical attention at a hospital or doctor's office? N When did it last happen?3 months ago If all above answers are "NO", may proceed with cephalosporin use.   Marland Kitchen Propoxyphene N-Acetaminophen Hives  . Tramadol Hcl Hives     Physical Exam:   Vitals  Blood pressure 133/85, pulse 94, temperature 99.6 F (37.6 C), temperature source Oral, resp. rate 16, height 6' 1"  (1.854 m), weight 89.4 kg, SpO2 100 %.  1.  General: axoxo3  2. Psychiatric: euthymic  3. Neurologic: Nonfocal,  Slight writhing movement of left arm  ? TD  4. HEENMT:  Anicteric, pupils 1.65m symmetric, direct, consensual, near intact Neck: no jvd  5. Respiratory : CTAB  6. Cardiovascular : rrr s1, s2,   7. Gastrointestinal:  Abd: soft, nt, nd, +bs  8. Skin:  Ext: no c/c/e,  0.5x 1.8cm opening, skin wound just below the left knee, yellow drainage, slight warmth, slight erythema, surrounding the area  9.Musculoskeletal:  Good ROM    Data Review:    CBC Recent Labs  Lab 11/16/18 1724  WBC 19.4*  HGB 14.4  HCT 42.4  PLT 402*  MCV 83.3  MCH 28.3  MCHC 34.0  RDW 13.2  LYMPHSABS 3.9  MONOABS 1.9*  EOSABS 0.0  BASOSABS 0.2*   ------------------------------------------------------------------------------------------------------------------  Results for orders placed or performed during the hospital encounter of 11/16/18 (from the past 48 hour(s))  CBC with Differential/Platelet     Status: Abnormal   Collection Time: 11/16/18  5:24 PM  Result Value Ref Range   WBC 19.4 (H) 4.0 - 10.5 K/uL   RBC 5.09 4.22 - 5.81 MIL/uL   Hemoglobin 14.4 13.0 - 17.0 g/dL   HCT 42.4 39.0 - 52.0 %   MCV 83.3 80.0 - 100.0 fL   MCH 28.3 26.0 - 34.0 pg   MCHC 34.0 30.0 - 36.0 g/dL   RDW 13.2 11.5 - 15.5 %   Platelets 402 (H) 150 - 400 K/uL   nRBC 0.0 0.0 - 0.2 %   Neutrophils Relative % 63 %   Lymphocytes Relative 20 %   Monocytes Relative 10 %   Eosinophils Relative 0 %    Basophils Relative 1 %   Band Neutrophils 6 %   Metamyelocytes Relative 0 %   Myelocytes 0 %  Promyelocytes Relative 0 %   Blasts 0 %   nRBC 0 0 /100 WBC   Other 0 %   Neutro Abs 13.4 (H) 1.7 - 7.7 K/uL   Lymphs Abs 3.9 0.7 - 4.0 K/uL   Monocytes Absolute 1.9 (H) 0.1 - 1.0 K/uL   Eosinophils Absolute 0.0 0.0 - 0.5 K/uL   Basophils Absolute 0.2 (H) 0.0 - 0.1 K/uL   WBC Morphology VACUOLATED NEUTROPHILS     Comment: Performed at [facility], [place] [address] [place], [place] [address]  Comprehensive metabolic panel     Status: Abnormal   Collection Time: 11/16/18  5:24 PM  Result Value Ref Range   Sodium 131 (L) 135 - 145 mmol/L   Potassium 5.1 3.5 - 5.1 mmol/L   Chloride 98 98 - 111 mmol/L   CO2 22 22 - 32 mmol/L   Glucose, Bld 106 (H) 70 - 99 mg/dL   BUN 20 6 - 20 mg/dL   Creatinine, Ser 1.10 0.61 - 1.24 mg/dL   Calcium 9.0 8.9 - 10.3 mg/dL   Total Protein 8.7 (H) 6.5 - 8.1 g/dL   Albumin 3.9 3.5 - 5.0 g/dL   AST 27 15 - 41 U/L   ALT 25 0 - 44 U/L   Alkaline Phosphatase 84 38 - 126 U/L   Total Bilirubin 1.6 (H) 0.3 - 1.2 mg/dL   GFR calc non Af Amer >60 >60 mL/min   GFR calc Af Amer >60 >60 mL/min   Anion gap 11 5 - 15    Comment: Performed at [facility], [place] [address], [place], [place] [address]  Wound or Superficial Culture     Status: None (Preliminary result)   Collection Time: 11/16/18  5:24 PM   Specimen: Wound  Result Value Ref Range   Specimen Description      WOUND Performed at [facility] [address] [place], [place] [address]    Special Requests      LEFT PROXIMAL TIBIA Performed at [facility] [address] [place], [place] [address]    Gram Stain      FEW WBC PRESENT, PREDOMINANTLY PMN FEW GRAM POSITIVE COCCI Performed at [facility], [place] [address] [place], [place] [address]    Culture PENDING    Report Status PENDING   Sedimentation rate     Status:  Abnormal   Collection Time: 11/16/18  5:24 PM  Result Value Ref Range   Sed Rate 47 (H) 0 - 16 mm/hr    Comment: Performed at [facility], [place] [address] [place], [place] [address]  C-reactive protein     Status: Abnormal   Collection Time: 11/16/18  7:08 PM  Result Value Ref Range   CRP 13.9 (H) <1.0 mg/dL    Comment: Performed at [facility], [place] [address], [place], [place] [address]    Chemistries  Recent Labs  Lab 11/16/18 1724  NA 131*  K 5.1  CL 98  CO2 22  GLUCOSE 106*  BUN 20  CREATININE 1.10  CALCIUM 9.0  AST 27  ALT 25  ALKPHOS 84  BILITOT 1.6*   ------------------------------------------------------------------------------------------------------------------  ------------------------------------------------------------------------------------------------------------------ GFR: Estimated Creatinine Clearance: 101.9 mL/min (by C-G formula based on SCr of 1.1 mg/dL). Liver Function Tests: Recent Labs  Lab 11/16/18 1724  AST 27  ALT 25  ALKPHOS 84  BILITOT 1.6*  PROT 8.7*  ALBUMIN 3.9   No results for input(s):  LIPASE, AMYLASE in the last 168 hours. No results for input(s): AMMONIA in the last 168 hours. Coagulation Profile: No results for input(s): INR, PROTIME in the last 168 hours. Cardiac Enzymes: No results for input(s): CKTOTAL, CKMB, CKMBINDEX, TROPONINI in the last 168 hours. BNP (last 3 results) No results for input(s): PROBNP in the last 8760 hours. HbA1C: No results for input(s): HGBA1C in the last 72 hours. CBG: No results for input(s): GLUCAP in the last 168 hours. Lipid Profile: No results for input(s): CHOL, HDL, LDLCALC, TRIG, CHOLHDL, LDLDIRECT in the last 72 hours. Thyroid Function Tests: No results for input(s): TSH, T4TOTAL, FREET4, T3FREE, THYROIDAB in the last 72 hours. Anemia Panel: No results for input(s): VITAMINB12, FOLATE, FERRITIN, TIBC, IRON, RETICCTPCT in the last 72 hours.   --------------------------------------------------------------------------------------------------------------- Urine analysis:    Component Value Date/Time   COLORURINE AMBER (A) 06/28/2013 1910   APPEARANCEUR CLEAR 06/28/2013 1910   LABSPEC 1.027 06/28/2013 1910   PHURINE 7.0 06/28/2013 1910   GLUCOSEU NEGATIVE 06/28/2013 1910   HGBUR NEGATIVE 06/28/2013 1910   HGBUR negative 07/28/2007 1039   BILIRUBINUR NEGATIVE 06/28/2013 1910   KETONESUR NEGATIVE 06/28/2013 1910   PROTEINUR 30 (A) 06/28/2013 1910   UROBILINOGEN 1.0 06/28/2013 1910   NITRITE NEGATIVE 06/28/2013 1910   LEUKOCYTESUR NEGATIVE 06/28/2013 1910      Imaging Results:    Dg Tibia/fibula Left  Result Date: 11/16/2018 CLINICAL DATA:  Swelling EXAM: LEFT TIBIA AND FIBULA - 2 VIEW COMPARISON:  July 01, 2017. FINDINGS: There is soft tissue swelling overlying the proximal left tibia. There is a soft tissue defect. There is some cortical erosion involving the tibial tubercle which has progressed since 2019. there is no dislocation. IMPRESSION: 1. No acute displaced fracture or dislocation. 2. Pretibial soft tissue swelling with evidence for an associated ulcer. 3. Erosion of the tibial tubercle which appears slightly progressed across prior studies. This could be secondary to underlying osteomyelitis. MRI may be useful for further evaluation of this finding. Electronically Signed   By: Constance Holster M.D.   On: 11/16/2018 18:51       Assessment & Plan:    Principal Problem:   Osteomyelitis ([place]) Active Problems:   CIGARETTE SMOKER   Hyponatremia   Leukocytosis  Osteomyelitis Blood culture x2 Wound culture vanco iv, cefepime iv pharmacy to dose  Leukocytosis Secondary to osteomyelitis Check cbc in am  Hyponatremia Hydrate with ns iv Check cmp in am  Tobacco use Pt counselled on smoking cessation x 3 minutes  Hypertension Cont Lisinopril 72m po qday  DVT Prophylaxis-   Lovenox - SCDs   AM Labs  Ordered, also please review Full Orders  Family Communication: Admission, patients condition and plan of care including tests being ordered have been discussed with the patient  who indicate understanding and agree with the plan and Code Status.  Code Status:  FULL CODE,  Attempted to call sister at [phone] no one picks up.   Admission status: Inpatient: Based on patients clinical presentation and evaluation of above clinical data, I have made determination that patient meets Inpatient criteria at this time.  Pt will require prolonged iv abx for ? Osteomyelitis ,  Pt will require iv abx, and has high risk of clinical deterioration without admission. Pt will require > 2nites stay. Inpatient status  Time spent in minutes : 55   JJani GravelM.D on 11/17/2018 at 12:52 AM

## 2018-11-17 NOTE — ED Notes (Signed)
Patient removed IV. Stated he is going home. Notified MD. Patient refused to speak with MD , stated his ride is on the way. Patient signed [facility] paperwork.

## 2018-11-18 LAB — AEROBIC CULTURE W GRAM STAIN (SUPERFICIAL SPECIMEN)

## 2018-11-18 LAB — AEROBIC CULTURE? (SUPERFICIAL SPECIMEN)

## 2018-11-19 ENCOUNTER — Telehealth: Payer: Self-pay | Admitting: *Deleted

## 2018-11-19 NOTE — Telephone Encounter (Signed)
Post ED Visit - Positive Culture Follow-up: Unsuccessful Patient Follow-up  Culture assessed and recommendations reviewed by:  []  Elenor Quinones, Pharm.D. []  Heide Guile, Pharm.D., BCPS AQ-ID []  Parks Neptune, Pharm.D., BCPS []  Alycia Rossetti, Pharm.D., BCPS []  [place], Pharm.D., BCPS, AAHIVP []  Legrand Como, Pharm.D., BCPS, AAHIVP []  Wynell Balloon, PharmD []  Vincenza Hews, PharmD, BCPS  Positive  Wound culture, reviewed by Rodell Perna, PA-C Recommend return to ED for further evaluation.  []  Patient discharged without antimicrobial prescription and treatment is now indicated []  Organism is resistant to prescribed ED discharge antimicrobial []  Patient with positive blood cultures   Unable to contact patient after 3 attempts, letter will be sent to address on file  Ardeen Fillers 11/19/2018, 2:58 PM

## 2018-11-22 LAB — CULTURE, BLOOD (ROUTINE X 2)
Culture: NO GROWTH
Culture: NO GROWTH
Special Requests: ADEQUATE
Special Requests: ADEQUATE

## 2019-03-03 ENCOUNTER — Other Ambulatory Visit: Payer: Medicaid Other

## 2019-03-05 ENCOUNTER — Other Ambulatory Visit: Payer: Self-pay | Admitting: Cardiology

## 2019-03-05 DIAGNOSIS — Z20822 Contact with and (suspected) exposure to covid-19: Secondary | ICD-10-CM

## 2019-03-06 LAB — NOVEL CORONAVIRUS, NAA: SARS-CoV-2, NAA: NOT DETECTED

## 2019-06-03 ENCOUNTER — Ambulatory Visit: Payer: Medicaid Other | Attending: Internal Medicine

## 2019-06-03 DIAGNOSIS — Z23 Encounter for immunization: Secondary | ICD-10-CM

## 2019-06-03 NOTE — Progress Notes (Signed)
   Covid-19 Vaccination Clinic  Name:  [name]    MRN: [id number] DOB: 07-24-1980  06/03/2019  Mr. Gadsby was observed post Covid-19 immunization for 15 minutes without incident. He was provided with Vaccine Information Sheet and instruction to access the V-Safe system.   Mr. Tallo was instructed to call 911 with any severe reactions post vaccine: Marland Kitchen Difficulty breathing  . Swelling of face and throat  . A fast heartbeat  . A bad rash all over body  . Dizziness and weakness   Immunizations Administered    Name Date Dose VIS Date Route   Pfizer COVID-19 Vaccine 06/03/2019 11:30 AM 0.3 mL 02/26/2019 Intramuscular   Manufacturer: [employer], [employer]   Lot: [id number]   NDC: [address]-1

## 2019-06-23 DIAGNOSIS — Z8661 Personal history of infections of the central nervous system: Secondary | ICD-10-CM | POA: Insufficient documentation

## 2019-06-28 ENCOUNTER — Ambulatory Visit: Payer: Medicaid Other | Attending: Internal Medicine

## 2019-06-28 DIAGNOSIS — Z23 Encounter for immunization: Secondary | ICD-10-CM

## 2019-06-28 NOTE — Progress Notes (Signed)
   Covid-19 Vaccination Clinic  Name:  KURTIS ANASTASIA    MRN: [id number] DOB: [birth year]  06/28/2019  Mr. Tiger was observed post Covid-19 immunization for 15 minutes without incident. He was provided with Vaccine Information Sheet and instruction to access the V-Safe system.   Mr. Scaife was instructed to call 911 with any severe reactions post vaccine: Marland Kitchen Difficulty breathing  . Swelling of face and throat  . A fast heartbeat  . A bad rash all over body  . Dizziness and weakness   Immunizations Administered    Name Date Dose VIS Date Route   Pfizer COVID-19 Vaccine 06/28/2019 10:39 AM 0.3 mL 02/26/2019 Intramuscular   Manufacturer: [employer], [employer]   Lot: [id number]   NDC: [address]-6

## 2020-01-06 ENCOUNTER — Emergency Department (HOSPITAL_COMMUNITY): Payer: Medicaid Other

## 2020-01-06 ENCOUNTER — Other Ambulatory Visit: Payer: Self-pay

## 2020-01-06 ENCOUNTER — Encounter (HOSPITAL_COMMUNITY): Payer: Self-pay | Admitting: Emergency Medicine

## 2020-01-06 ENCOUNTER — Inpatient Hospital Stay (HOSPITAL_COMMUNITY)
Admission: EM | Admit: 2020-01-06 | Discharge: 2020-01-14 | DRG: 854 | Disposition: A | Discharge status: Home / Self Care | Payer: Medicaid Other | Attending: Family Medicine | Admitting: Family Medicine

## 2020-01-06 DIAGNOSIS — Z8673 Personal history of transient ischemic attack (TIA), and cerebral infarction without residual deficits: Secondary | ICD-10-CM | POA: Diagnosis not present

## 2020-01-06 DIAGNOSIS — R471 Dysarthria and anarthria: Secondary | ICD-10-CM | POA: Diagnosis present

## 2020-01-06 DIAGNOSIS — Z8661 Personal history of infections of the central nervous system: Secondary | ICD-10-CM

## 2020-01-06 DIAGNOSIS — F17210 Nicotine dependence, cigarettes, uncomplicated: Secondary | ICD-10-CM | POA: Diagnosis present

## 2020-01-06 DIAGNOSIS — F1011 Alcohol abuse, in remission: Secondary | ICD-10-CM | POA: Diagnosis present

## 2020-01-06 DIAGNOSIS — E871 Hypo-osmolality and hyponatremia: Secondary | ICD-10-CM | POA: Diagnosis present

## 2020-01-06 DIAGNOSIS — Z20822 Contact with and (suspected) exposure to covid-19: Secondary | ICD-10-CM | POA: Diagnosis not present

## 2020-01-06 DIAGNOSIS — L299 Pruritus, unspecified: Secondary | ICD-10-CM | POA: Diagnosis present

## 2020-01-06 DIAGNOSIS — A419 Sepsis, unspecified organism: Principal | ICD-10-CM | POA: Diagnosis present

## 2020-01-06 DIAGNOSIS — Z888 Allergy status to other drugs, medicaments and biological substances status: Secondary | ICD-10-CM

## 2020-01-06 DIAGNOSIS — Z880 Allergy status to penicillin: Secondary | ICD-10-CM | POA: Diagnosis not present

## 2020-01-06 DIAGNOSIS — R509 Fever, unspecified: Secondary | ICD-10-CM | POA: Diagnosis not present

## 2020-01-06 DIAGNOSIS — R3129 Other microscopic hematuria: Secondary | ICD-10-CM | POA: Diagnosis present

## 2020-01-06 DIAGNOSIS — R451 Restlessness and agitation: Secondary | ICD-10-CM | POA: Diagnosis present

## 2020-01-06 DIAGNOSIS — Z79899 Other long term (current) drug therapy: Secondary | ICD-10-CM | POA: Diagnosis not present

## 2020-01-06 DIAGNOSIS — G8114 Spastic hemiplegia affecting left nondominant side: Secondary | ICD-10-CM | POA: Diagnosis present

## 2020-01-06 DIAGNOSIS — L97924 Non-pressure chronic ulcer of unspecified part of left lower leg with necrosis of bone: Secondary | ICD-10-CM

## 2020-01-06 DIAGNOSIS — Z881 Allergy status to other antibiotic agents status: Secondary | ICD-10-CM | POA: Diagnosis not present

## 2020-01-06 DIAGNOSIS — F32A Depression, unspecified: Secondary | ICD-10-CM | POA: Diagnosis present

## 2020-01-06 DIAGNOSIS — T368X5A Adverse effect of other systemic antibiotics, initial encounter: Secondary | ICD-10-CM | POA: Diagnosis present

## 2020-01-06 DIAGNOSIS — F1290 Cannabis use, unspecified, uncomplicated: Secondary | ICD-10-CM | POA: Diagnosis present

## 2020-01-06 DIAGNOSIS — M86162 Other acute osteomyelitis, left tibia and fibula: Secondary | ICD-10-CM | POA: Diagnosis not present

## 2020-01-06 DIAGNOSIS — I10 Essential (primary) hypertension: Secondary | ICD-10-CM | POA: Diagnosis present

## 2020-01-06 DIAGNOSIS — K5900 Constipation, unspecified: Secondary | ICD-10-CM | POA: Diagnosis present

## 2020-01-06 DIAGNOSIS — M869 Osteomyelitis, unspecified: Secondary | ICD-10-CM

## 2020-01-06 DIAGNOSIS — Z8249 Family history of ischemic heart disease and other diseases of the circulatory system: Secondary | ICD-10-CM | POA: Diagnosis not present

## 2020-01-06 DIAGNOSIS — M86662 Other chronic osteomyelitis, left tibia and fibula: Secondary | ICD-10-CM | POA: Diagnosis not present

## 2020-01-06 DIAGNOSIS — Z23 Encounter for immunization: Secondary | ICD-10-CM

## 2020-01-06 DIAGNOSIS — F17200 Nicotine dependence, unspecified, uncomplicated: Secondary | ICD-10-CM | POA: Diagnosis present

## 2020-01-06 LAB — BASIC METABOLIC PANEL
Anion gap: 13 (ref 5–15)
BUN: 11 mg/dL (ref 6–20)
CO2: 25 mmol/L (ref 22–32)
Calcium: 9.8 mg/dL (ref 8.9–10.3)
Chloride: 97 mmol/L — ABNORMAL LOW (ref 98–111)
Creatinine, Ser: 1 mg/dL (ref 0.61–1.24)
GFR, Estimated: >60 mL/min (ref >60)
Glucose, Bld: 107 mg/dL — ABNORMAL HIGH (ref 70–99)
Potassium: 3.9 mmol/L (ref 3.5–5.1)
Sodium: 135 mmol/L (ref 135–145)

## 2020-01-06 LAB — CBC WITH DIFFERENTIAL/PLATELET
Abs Immature Granulocytes: 0 10*3/uL (ref 0.00–0.07)
Basophils Absolute: 0 10*3/uL (ref 0.0–0.1)
Basophils Relative: 0 %
Eosinophils Absolute: 0 10*3/uL (ref 0.0–0.5)
Eosinophils Relative: 0 %
HCT: 45.3 % (ref 39.0–52.0)
Hemoglobin: 15.1 g/dL (ref 13.0–17.0)
Lymphocytes Relative: 14 %
Lymphs Abs: 3.9 10*3/uL (ref 0.7–4.0)
MCH: 28.1 pg (ref 26.0–34.0)
MCHC: 33.3 g/dL (ref 30.0–36.0)
MCV: 84.4 fL (ref 80.0–100.0)
Monocytes Absolute: 1.7 10*3/uL — ABNORMAL HIGH (ref 0.1–1.0)
Monocytes Relative: 6 %
Neutro Abs: 22.6 10*3/uL — ABNORMAL HIGH (ref 1.7–7.7)
Neutrophils Relative %: 80 %
Platelets: 444 10*3/uL — ABNORMAL HIGH (ref 150–400)
RBC: 5.37 MIL/uL (ref 4.22–5.81)
RDW: 13.7 % (ref 11.5–15.5)
WBC: 28.2 10*3/uL — ABNORMAL HIGH (ref 4.0–10.5)
nRBC: 0 % (ref 0.0–0.2)
nRBC: 0 /100 WBC

## 2020-01-06 LAB — URINALYSIS, ROUTINE W REFLEX MICROSCOPIC
Bilirubin Urine: NEGATIVE
Glucose, UA: NEGATIVE mg/dL
Ketones, ur: NEGATIVE mg/dL
Leukocytes,Ua: NEGATIVE
Nitrite: NEGATIVE
Protein, ur: NEGATIVE mg/dL
Specific Gravity, Urine: 1.017 (ref 1.005–1.030)
pH: 8 (ref 5.0–8.0)

## 2020-01-06 LAB — APTT: aPTT: 35 seconds (ref 24–36)

## 2020-01-06 LAB — RESPIRATORY PANEL BY RT PCR (FLU A&B, COVID)
Influenza A by PCR: NEGATIVE
Influenza B by PCR: NEGATIVE
SARS Coronavirus 2 by RT PCR: NEGATIVE

## 2020-01-06 LAB — PROTIME-INR
INR: 1.1 (ref 0.8–1.2)
Prothrombin Time: 14.1 seconds (ref 11.4–15.2)

## 2020-01-06 LAB — LACTIC ACID, PLASMA
Lactic Acid, Venous: 1.3 mmol/L (ref 0.5–1.9)
Lactic Acid, Venous: 1.6 mmol/L (ref 0.5–1.9)

## 2020-01-06 MED ORDER — LACTATED RINGERS IV BOLUS (SEPSIS)
1000.0000 mL | Freq: Once | INTRAVENOUS | Status: AC
  Administered 2020-01-06: 1000 mL via INTRAVENOUS

## 2020-01-06 MED ORDER — METRONIDAZOLE IN NACL 5-0.79 MG/ML-% IV SOLN
500.0000 mg | Freq: Three times a day (TID) | INTRAVENOUS | Status: DC
  Administered 2020-01-06 – 2020-01-08 (×5): 500 mg via INTRAVENOUS
  Filled 2020-01-06 (×5): qty 100

## 2020-01-06 MED ORDER — ENOXAPARIN SODIUM 40 MG/0.4ML ~~LOC~~ SOLN
40.0000 mg | SUBCUTANEOUS | Status: DC
  Administered 2020-01-06 – 2020-01-10 (×5): 40 mg via SUBCUTANEOUS
  Filled 2020-01-06 (×5): qty 0.4

## 2020-01-06 MED ORDER — LINEZOLID 600 MG/300ML IV SOLN
600.0000 mg | Freq: Two times a day (BID) | INTRAVENOUS | Status: DC
  Administered 2020-01-06 – 2020-01-08 (×4): 600 mg via INTRAVENOUS
  Filled 2020-01-06 (×6): qty 300

## 2020-01-06 MED ORDER — DIPHENHYDRAMINE HCL 50 MG/ML IJ SOLN: 50.0000 mg | Freq: Once | INTRAMUSCULAR | Status: AC

## 2020-01-06 MED ORDER — FAMOTIDINE IN NACL 20-0.9 MG/50ML-% IV SOLN
20.0000 mg | Freq: Once | INTRAVENOUS | Status: AC
  Administered 2020-01-06: 20 mg via INTRAVENOUS
  Filled 2020-01-06: qty 50

## 2020-01-06 MED ORDER — DIPHENHYDRAMINE HCL 50 MG/ML IJ SOLN
INTRAMUSCULAR | Status: AC
  Administered 2020-01-06: 50 mg via INTRAVENOUS
  Filled 2020-01-06: qty 1

## 2020-01-06 MED ORDER — METHYLPREDNISOLONE SODIUM SUCC 125 MG IJ SOLR
INTRAMUSCULAR | Status: AC
  Administered 2020-01-06: 125 mg via INTRAVENOUS
  Filled 2020-01-06: qty 2

## 2020-01-06 MED ORDER — SODIUM CHLORIDE 0.9 % IV SOLN
1.0000 g | INTRAVENOUS | Status: DC
  Administered 2020-01-06: 1 g via INTRAVENOUS
  Filled 2020-01-06: qty 10

## 2020-01-06 MED ORDER — VANCOMYCIN HCL 2000 MG/400ML IV SOLN
2000.0000 mg | Freq: Once | INTRAVENOUS | Status: AC
  Administered 2020-01-06: 2000 mg via INTRAVENOUS
  Filled 2020-01-06: qty 400

## 2020-01-06 MED ORDER — ACETAMINOPHEN 650 MG RE SUPP: 650.0000 mg | Freq: Four times a day (QID) | RECTAL | Status: DC | PRN

## 2020-01-06 MED ORDER — LACTATED RINGERS IV SOLN: INTRAVENOUS | Status: AC

## 2020-01-06 MED ORDER — POLYETHYLENE GLYCOL 3350 17 G PO PACK: 17.0000 g | PACK | Freq: Every day | ORAL | Status: DC | PRN

## 2020-01-06 MED ORDER — EPINEPHRINE 0.3 MG/0.3ML IJ SOAJ
INTRAMUSCULAR | Status: AC
  Filled 2020-01-06: qty 0.3

## 2020-01-06 MED ORDER — METRONIDAZOLE IN NACL 5-0.79 MG/ML-% IV SOLN
500.0000 mg | Freq: Three times a day (TID) | INTRAVENOUS | Status: DC
  Administered 2020-01-06: 500 mg via INTRAVENOUS
  Filled 2020-01-06: qty 100

## 2020-01-06 MED ORDER — VANCOMYCIN HCL IN DEXTROSE 1-5 GM/200ML-% IV SOLN
1000.0000 mg | Freq: Three times a day (TID) | INTRAVENOUS | Status: DC
  Filled 2020-01-06: qty 200

## 2020-01-06 MED ORDER — ACETAMINOPHEN 325 MG PO TABS
650.0000 mg | ORAL_TABLET | Freq: Four times a day (QID) | ORAL | Status: DC | PRN
  Administered 2020-01-12 – 2020-01-14 (×4): 650 mg via ORAL
  Filled 2020-01-06 (×4): qty 2

## 2020-01-06 NOTE — Progress Notes (Signed)
Spoke with Mr. Prince Rome, PA working with Dr. Lajoyce Corners regarding pt's ongoing osteomyelitis. Ortho plans to see pt.   Katha Cabal, DO PGY-2, [facility] 01/06/2020 3:45 PM

## 2020-01-06 NOTE — ED Notes (Signed)
Pt ambulatory with limp due to left knee abcess. Abcess present with yellow drainage.

## 2020-01-06 NOTE — ED Notes (Signed)
Pt continues to report wanting to leave ama, this rn told pt we do not have bus passes at this time and pt agrees to stay through till the morning. Pt state he has a funeral to go to in the morning. VSS on monitor, antibiotic infusing without difficulty, side rails up, pt placed back on monitor.

## 2020-01-06 NOTE — ED Notes (Signed)
Dr patel and dr sun speaking to pt on phone, pt states he is willing to stay if he can walk around outside. Pt reports feeling claustrophobic.

## 2020-01-06 NOTE — Progress Notes (Signed)
Received page from [facility] regarding patient wanting to leave AMA.  Spoke with patient on the phone.  He states he is wanting to leave because he is feeling claustrophobic and wants to go outside for fresh air.  Patient states he will stay if he can go outside.  - OK to go outside if accompanied and in a wheelchair.

## 2020-01-06 NOTE — ED Triage Notes (Signed)
Pt having wound infection on left knee he wants it to be check.

## 2020-01-06 NOTE — ED Notes (Signed)
Hives on left arm fading, denies any resp distress at present

## 2020-01-06 NOTE — ED Notes (Signed)
Upon assessment of patient, RN noticed patient had hives to the left forearm where Vancomycin was infusing. Stopped Vancomycin and notified the provided. Pt has patent airway with no throat swelling. Pt denies any complaints except itching of the effected extremity. Pt requesting to go to bathroom. Ambulatory with steady gait. Resp even and unlabored. No abnormal lung sounds. Pt provided with urinal for UA sample.

## 2020-01-06 NOTE — H&P (Addendum)
Family Medicine Teaching [facility] Admission History and Physical Service Pager: [phone]  Patient name: [name] Medical record number: [id number] Date of birth: September 25, 1980 Age: 39 y.o. Gender: male  Primary Care Provider: Verlon Au, MD Consultants: Orthopedics Code Status: Full Preferred Emergency Contact: Norva Pavlov (Sister) [phone]  Chief Complaint: Infected knee wound  Assessment and Plan: [name] is a 39 y.o. male presenting with a purulent wound on the left knee, found to have osteomyelitis on X ray. PMH is significant for HTN and osteomyelitis of the left knee last year for which he was hospitalized and left AMA.  Sepsis 2/2 Osteomyelitis: Patient has experienced left knee pain for the past three years.  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 Previously diagnosed with osteomyelitis in that knee a year ago but left the ED AMA before treatment. This morning, He noticed the wound became purulent and painful at approximately 3am. He had a fever of 101.9 upon admission. Highest pulse rate recorded was 138, highest BP was 165/128; WBC of 28k, meeting 3/4 sepsis criteria. Pt was given IVF boluses and started on CTX, flagyl, and vanc.  He developed hives on his left arm in reaction to IV vancomycin which resolved after the vancomycin was discontinued. Pt then started on linezolid.  - admit to inpatient, med-surg,  Dr. Pollie Meyer attending, -Tylenol PRN  -IV LR -orthopedic consulted, appreciate recs -f/u blood cultures  - continue broad spec abx: linezolid, ctx, flagyl - vitals per routine - am cbc, bmp  HTN: Patient takes lisinopril for HTN at home - holding lisinopril while treating for sepsis.   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  Microscopic Hematuria  6-10 rbc/hpf seen on UA.   - recheck at o/p follow up  FEN/GI: Regular diet Prophylaxis: Lovenox  Disposition: med/surg  History of Present Illness:  [name] is a 39 y.o. male presenting with a purulent wound on the left knee.  Patient states this morning around 3am he woke up and was having pain and purulent drainage in his left knee.  He has a chronic open wound there going back several years, that is sometimes painful but is never purulent or swollen.  The pain usually comes and goes and he will take tylenol PRN.  He states the pain is currently a '6/10'.  He denies n/v/d.  He endorsed constipation. He states he did not have any fevers until he came to the hospital.   Pt states he sometimes loses his balance and falls.  He last fell two months ago.    Pt sees a pcp, Dr. Leavy Cella, and is treated by her for HTN with lisinopril.    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   PSH: pt states he has had '13 knee surgeries', with 9 of them being at [facility].    SH: pt is currently staying with his godsister in an apartment.  He drinks moonshine "PRN", which he states is 'once or twice a year'.  He smokes a 1/4 pack per day for 25 years.  He smokes marijuana "PRN" which is every other day approximately.    Review Of Systems: Per HPI with the  following additions:   Review of Systems  Constitutional: Positive for fever. Negative for chills, diaphoresis, malaise/fatigue and weight loss.  HENT: Negative for sore throat.   Eyes: Negative for blurred vision.  Respiratory: Negative for cough and shortness of breath.   Cardiovascular: Positive for chest pain.  Gastrointestinal: Negative for abdominal pain, diarrhea, nausea and vomiting.  Skin: Positive for itching and rash.  Neurological: Positive for dizziness.  Psychiatric/Behavioral: Positive for  substance abuse.    Patient Active Problem List   Diagnosis Date Noted  . Leukocytosis 11/17/2018  . Osteomyelitis (HCC) 11/16/2018  . HCAP (healthcare-associated pneumonia) 06/28/2013  . Chest pain, atypical 06/28/2013  . SIRS (systemic inflammatory response syndrome) (HCC) 06/28/2013  . Hyponatremia 06/28/2013  . Headache 01/05/2013  . Leg pain 01/05/2013  . Dizziness 01/05/2013  . 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 CIGARETTE SMOKER 06/10/2007  . DEPRESSION 06/10/2007  . 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 POSTTRAUMATIC WOUND INFECTION NEC 06/10/2007    Past Medical History: Past Medical History:  Diagnosis Date  . Depression   . Encephalitis   . Hypertension   . Meningitis   . 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 Stroke [facility])     Past Surgical History: Past Surgical History:  Procedure Laterality Date  . JOINT REPLACEMENT    . KNEE SURGERY      Social History: Social History   Tobacco Use  . Smoking status: Current Every Day Smoker    Packs/day: 0.25  . Smokeless tobacco: Never Used  Vaping Use  . Vaping Use: Never used  Substance Use Topics  . Alcohol use: No  . Drug use: Yes    Types: Marijuana    Comment: not used in 3 weeks    Please also refer to relevant sections of EMR.  Family History: Family History  Problem Relation Age of Onset  . Hypertension Mother   . Hypertension Father      Allergies and Medications: Allergies  Allergen Reactions  . Vancomycin Hives    Itching and body hives within minutes of initiating Vancomycin infusion  . Ciprofloxacin Other (See Comments)    Reaction=hypertension  . Hydrocodone-Acetaminophen Hives  . Ibuprofen Hives  . Penicillins Hives and Nausea Only    Did it involve swelling of the face/tongue/throat, SOB, or low BP? N Did it involve sudden or severe rash/hives, skin peeling, or any reaction  on the inside of your mouth or nose? N Did you need to seek medical attention at a hospital or doctor's office? N When did it last happen?3 months ago If all above answers are "NO", may proceed with cephalosporin use.   Marland Kitchen Propoxyphene N-Acetaminophen Hives  . Tramadol Hcl Hives   No current facility-administered medications on file prior to encounter.   Current Outpatient Medications on File Prior to Encounter  Medication Sig Dispense Refill  . amoxicillin-clavulanate (AUGMENTIN) 875-125 MG tablet Take 1 tablet by mouth every 12 (twelve) hours. (Patient not taking: Reported on 11/16/2018) 14 tablet 0  . cyclobenzaprine (FLEXERIL) 10 MG tablet Take 10 mg by mouth daily as needed for muscle spasms.     Marland Kitchen lisinopril (ZESTRIL) 10 MG tablet Take 10 mg by mouth daily.     . naproxen sodium (ANAPROX) 220 MG tablet Take 440 mg by mouth 2 (two) times daily as needed. For pain    . oxybutynin (DITROPAN) 5 MG tablet Take 5 mg by mouth daily.     Marland Kitchen  penicillin v potassium (VEETID) 500 MG tablet Take 1 tablet (500 mg total) by mouth 4 (four) times daily. (Patient not taking: Reported on 11/16/2018) 40 tablet 0    Objective: BP 101/73 (BP Location: Right Arm)   Pulse (!) 106   Temp 99.9 F (37.7 C) (Oral)   Resp 18   Ht 6\' 1"  (1.854 m)   Wt 89.4 kg   SpO2 98%   BMI 26.00 kg/m  Exam: General: Alert, resting comfortably, in no apparent distress Eyes: pinpoint Pupils b/l,, EOM intact ENTM: edentulous in the upper mouth, no erythema or edema of oral mucosa or throat Neck: no JVD, scar from previous tracheostomy Cardiovascular: S1 and S2 normal, RRR, mo murmur Respiratory: CTA bilaterally Gastrointestinal: non tender, non distended, normal bowel sounds MSK: full range of motion of left knee Derm: No rash, no edema, tattoos present Neuro: CN 2-12 intact, mildly spastic gait favoring the left side.  Psych: affect appropriate for circumstance  Labs and Imaging: CBC BMET  Recent Labs  Lab  01/06/20 1116  WBC 28.2*  HGB 15.1  HCT 45.3  PLT 444*   Recent Labs  Lab 01/06/20 1116  NA 135  K 3.9  CL 97*  CO2 25  BUN 11  CREATININE 1.00  GLUCOSE 107*  CALCIUM 9.8       01/08/20, Medical Student 01/06/2020, 3:25 PM OMS-IV AI, [facility] FPTS Intern pager: [phone], text pages welcome  Resident Addendum I have separately seen and examined the patient.  I have discussed the findings and exam with the student and agree with the above note.  I helped develop the management plan that is described in the student's note and I agree with the content.  Changes have been made in BLUE.    [id number], MD PGY-2 [facility] residency program

## 2020-01-06 NOTE — ED Notes (Signed)
PT REPORTING WANTING TO LEAVE AMA, DR SUN notified at this time.

## 2020-01-06 NOTE — Progress Notes (Signed)
Pharmacy Antibiotic Note  [name] is a 39 y.o. male admitted on 01/06/2020 with wound infection and sepsis.  Pharmacy has been consulted for vancomycin dosing. Pt is febrile with Tmax 101.9 and WBC is elevated at 28.2. Scr is normal at 1.   Plan: Vancomycin 2gm IV x 1 then 1gm IV Q8H F/u renal fxn, C&S, clinical status and trough at SS  Height: 6\' 1"  (185.4 cm) Weight: 89.4 kg (197 lb 1.5 oz) IBW/kg (Calculated) : 79.9  Temp (24hrs), Avg:101.9 F (38.8 C), Min:101.9 F (38.8 C), Max:101.9 F (38.8 C)  Recent Labs  Lab 01/06/20 1116  WBC 28.2*  CREATININE 1.00  LATICACIDVEN 1.3    Estimated Creatinine Clearance: 111 mL/min (by C-G formula based on SCr of 1 mg/dL).    Allergies  Allergen Reactions  . Ciprofloxacin Other (See Comments)    Reaction=hypertension  . Hydrocodone-Acetaminophen Hives  . Ibuprofen Hives  . Penicillins Hives and Nausea Only    Did it involve swelling of the face/tongue/throat, SOB, or low BP? N Did it involve sudden or severe rash/hives, skin peeling, or any reaction on the inside of your mouth or nose? N Did you need to seek medical attention at a hospital or doctor's office? N When did it last happen?3 months ago If all above answers are "NO", may proceed with cephalosporin use.   01/08/20 Propoxyphene N-Acetaminophen Hives  . Tramadol Hcl Hives    Antimicrobials this admission: Vanc 10/21>> CTX 10/21>> Flagyl 10/21>>  Dose adjustments this admission: N/A  Microbiology results: Pending  Thank you for allowing pharmacy to be a part of this patient's care.  Ifeanyichukwu Wickham, Marland Kitchen 01/06/2020 12:04 PM

## 2020-01-06 NOTE — ED Notes (Signed)
Pt in x-ray at this time

## 2020-01-06 NOTE — ED Notes (Signed)
Admitting MD at bedside.

## 2020-01-06 NOTE — ED Provider Notes (Signed)
[facility] EMERGENCY DEPARTMENT Provider Note   CSN: [id number] Arrival date & time: 01/06/20  1042     History Chief Complaint  Patient presents with  . Wound Check    [name] is a 39 y.o. male present emergency department with complaint of left knee pain.  Patient reports has had a chronic open wound on his left knee for over a year.  He was seen in our hospital nearly 12 months ago in September 2020, at which time he had an MRI concerning for osteomyelitis of the tibial tuberosity of the left side.  He left the hospital AMA as he reports he had family concerns to 10 2.  He returned to the hospital today complaining of generalized fevers, chills, worsening pain of his left knee.  He denies any headache, loss of consciousness, upper back pain.  He has been able to ambulate.  Today he is willing to stay in the hospital if necessary.  He has made arrangements to do so.  His allergies to ciprofloxacin, ibuprofen, Norco, penicillins, tramadol.  HPI     Past Medical History:  Diagnosis Date  . Depression   . Encephalitis   . Hypertension   . Meningitis   . 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 Stroke [facility])     Patient Active Problem List   Diagnosis Date Noted  . Sepsis (HCC)   . Primary hypertension   . Leukocytosis 11/17/2018  . Osteomyelitis (HCC) 11/16/2018  . HCAP (healthcare-associated pneumonia) 06/28/2013  . Chest pain, atypical 06/28/2013  . SIRS (systemic inflammatory response syndrome) (HCC) 06/28/2013  . Hyponatremia 06/28/2013  . Headache 01/05/2013  . Leg pain 01/05/2013  . Dizziness 01/05/2013  . 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 CIGARETTE SMOKER 06/10/2007  . DEPRESSION 06/10/2007  . 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 POSTTRAUMATIC WOUND INFECTION NEC 06/10/2007    Past Surgical History:  Procedure Laterality Date  .  JOINT REPLACEMENT    . KNEE SURGERY         Family History  Problem Relation Age of Onset  . Hypertension Mother   . Hypertension Father     Social History   Tobacco Use  . Smoking status: Current Every Day Smoker    Packs/day: 0.25  . Smokeless tobacco: Never Used  Vaping Use  . Vaping Use: Never used  Substance Use Topics  . Alcohol use: No  . Drug use: Yes    Types: Marijuana    Comment: not used in 3 weeks    Home Medications Prior to Admission medications   Medication Sig Start Date End Date Taking? Authorizing Provider  lisinopril (ZESTRIL) 10 MG tablet Take 10 mg by mouth daily.    Yes [provider]    Allergies    Vancomycin, Ciprofloxacin, Hydrocodone-acetaminophen, Ibuprofen, Penicillins, Propoxyphene n-acetaminophen, and Tramadol hcl  Review of Systems   Review of Systems  Constitutional: Positive for chills and fever.  HENT: Negative for ear pain and sore throat.   Eyes: Negative for pain and visual disturbance.  Respiratory: Negative for cough and shortness of breath.   Cardiovascular: Negative for chest pain and palpitations.  Gastrointestinal: Negative for abdominal pain and vomiting.  Genitourinary: Negative for dysuria and hematuria.  Musculoskeletal: Positive for arthralgias and myalgias.  Skin: Positive for rash and wound.  Neurological: Negative for syncope, light-headedness and headaches.  Psychiatric/Behavioral: Negative for agitation and confusion.  All other systems reviewed and are negative.   Physical Exam Updated Vital Signs BP 101/73 (BP Location: Right Arm)   Pulse (!) 106   Temp 99.9 F (37.7 C) (Oral)   Resp 18   Ht 6\' 1"  (1.854 m)   Wt 89.4 kg   SpO2 98%   BMI 26.00 kg/m   Physical Exam Vitals and nursing note reviewed.  Constitutional:      Appearance: He is well-developed.  HENT:     Head: Normocephalic and atraumatic.  Eyes:     Conjunctiva/sclera: Conjunctivae normal.  Cardiovascular:     Rate and  Rhythm: Normal rate and regular rhythm.     Pulses: Normal pulses.  Pulmonary:     Effort: Pulmonary effort is normal. No respiratory distress.     Breath sounds: Normal breath sounds.  Abdominal:     Palpations: Abdomen is soft.     Tenderness: There is no abdominal tenderness.  Musculoskeletal:     Cervical back: Neck supple.  Skin:    General: Skin is warm and dry.     Comments: Ulcerated, open lesion overlying left tibial tuberosity with surrounding erythema  Neurological:     General: No focal deficit present.     Mental Status: He is alert and oriented to person, place, and time.  Psychiatric:        Mood and Affect: Mood normal.        Behavior: Behavior normal.     ED Results / Procedures / Treatments   Labs (all labs ordered are listed, but only abnormal results are displayed) Labs Reviewed  CBC WITH DIFFERENTIAL/PLATELET - Abnormal; Notable for the following components:      Result Value   WBC 28.2 (*)    Platelets 444 (*)    Neutro Abs 22.6 (*)    Monocytes Absolute 1.7 (*)    All other components within normal limits  BASIC METABOLIC PANEL - Abnormal; Notable for the following components:   Chloride 97 (*)    Glucose, Bld 107 (*)    All other components within normal limits  URINALYSIS, ROUTINE W REFLEX MICROSCOPIC - Abnormal; Notable for the following components:   Hgb urine dipstick SMALL (*)    Bacteria, UA RARE (*)    All other components within normal limits  RESPIRATORY PANEL BY RT PCR (FLU A&B, COVID)  CULTURE, BLOOD (ROUTINE X 2)  CULTURE, BLOOD (ROUTINE X 2)  URINE CULTURE  MRSA PCR SCREENING  LACTIC ACID, PLASMA  LACTIC ACID, PLASMA  PROTIME-INR  APTT  HIV ANTIBODY (ROUTINE TESTING W REFLEX)  PROTIME-INR  BASIC METABOLIC PANEL  CBC    EKG EKG Interpretation  Date/Time:  Thursday January 06 2020 12:28:04 EDT Ventricular Rate:  113 PR Interval:  152 QRS Duration: 80 QT Interval:  296 QTC Calculation: 406 R Axis:   74 Text  Interpretation: ISinus tachycardia Inverted T wave in lead 3, new from prior, may be physiological or related to LVH No STEMI Confirmed by 1982 [phone]) on 01/06/2020 1:25:15 PM   Radiology DG Chest 1 View  Result Date: 01/06/2020 CLINICAL DATA:  Multifocal osteomyelitis EXAM: CHEST  1 VIEW COMPARISON:  06/28/2013 FINDINGS: The heart size and mediastinal contours are within normal limits. Both lungs are clear. The visualized skeletal structures are unremarkable. IMPRESSION: No active disease. Electronically Signed   By: 06/30/2013 MD   On: 01/06/2020 13:23   DG Knee 2 Views Left  Result Date: 01/06/2020 CLINICAL DATA:  Osteomyelitis EXAM: LEFT  KNEE - 1-2 VIEW COMPARISON:  None. FINDINGS: Two view radiograph left knee demonstrates superficial ulceration and soft tissue swelling anterior to the anterior tibial tubercle with erosion of the tubercle itself in keeping with subjacent osteomyelitis. There is associated thickening of the patellar tendon, likely related to inflammation or trauma. Hoffa fat is preserved. No effusion. No fracture or dislocation. Medial and lateral joint spaces are preserved. Patellofemoral joint space is not well profiled. IMPRESSION: Superficial wound and associated soft tissue swelling with subjacent osteomyelitis involving the anterior tibial tubercle. Patellar tendon thickening in keeping with inflammation or trauma. Electronically Signed   By: Helyn Numbers MD   On: 01/06/2020 13:23   DG Tibia/Fibula Left  Result Date: 01/06/2020 CLINICAL DATA:  Osteomyelitis EXAM: LEFT TIBIA AND FIBULA - 2 VIEW COMPARISON:  11/16/2018 FINDINGS: There is persistent soft tissue swelling seen anterior to the anterior tibial tubercle within associated soft tissue defect in keeping with a a superficial ulceration. There is progressive, mild erosion involving the anterior tibial tubercle when compared to prior examination in keeping with changes of osteomyelitis. There is mild  thickening of the inferior patellar tendon, possibly related to inflammation or trauma. No fracture or dislocation. There is soft tissue swelling superficial to the lateral malleolus with associated small superficial ulceration identified. IMPRESSION: Soft tissue wound, swelling, and subjacent osteomyelitis involving the anterior tibial tubercle. Patellar tendon thickening in keeping with inflammation or trauma Soft tissue swelling superficial to the lateral malleolus without associated osseous erosion. And Electronically Signed   By: Helyn Numbers MD   On: 01/06/2020 13:21    Procedures .Critical Care Performed by: Terald Sleeper, MD Authorized by: Terald Sleeper, MD   Critical care provider statement:    Critical care time (minutes):  45   Critical care was necessary to treat or prevent imminent or life-threatening deterioration of the following conditions:  Sepsis   Critical care was time spent personally by me on the following activities:  Discussions with consultants, evaluation of patient's response to treatment, examination of patient, ordering and performing treatments and interventions, ordering and review of laboratory studies, ordering and review of radiographic studies, pulse oximetry, re-evaluation of patient's condition, obtaining history from patient or surrogate and review of old charts   (including critical care time)  Medications Ordered in ED Medications  lactated ringers infusion ( Intravenous New Bag/Given 01/06/20 1356)  cefTRIAXone (ROCEPHIN) 1 g in sodium chloride 0.9 % 100 mL IVPB (0 g Intravenous Stopped 01/06/20 1355)  EPINEPHrine (EPI-PEN) 0.3 mg/0.3 mL injection (has no administration in time range)  metroNIDAZOLE (FLAGYL) IVPB 500 mg (has no administration in time range)  enoxaparin (LOVENOX) injection 40 mg (has no administration in time range)  polyethylene glycol (MIRALAX / GLYCOLAX) packet 17 g (has no administration in time range)  acetaminophen  (TYLENOL) tablet 650 mg (has no administration in time range)    Or  acetaminophen (TYLENOL) suppository 650 mg (has no administration in time range)  lactated ringers bolus 1,000 mL (0 mLs Intravenous Stopped 01/06/20 1355)    And  lactated ringers bolus 1,000 mL (0 mLs Intravenous Stopped 01/06/20 1355)    And  lactated ringers bolus 1,000 mL (0 mLs Intravenous Stopped 01/06/20 1411)  vancomycin (VANCOREADY) IVPB 2000 mg/400 mL (0 mg Intravenous Stopped 01/06/20 1338)  methylPREDNISolone sodium succinate (SOLU-MEDROL) 125 mg/2 mL injection (125 mg Intravenous Given 01/06/20 1355)  diphenhydrAMINE (BENADRYL) injection 50 mg (50 mg Intravenous Given 01/06/20 1354)  famotidine (PEPCID) IVPB 20 mg premix (0 mg  Intravenous Stopped 01/06/20 1605)    ED Course  I have reviewed the triage vital signs and the nursing notes.  Pertinent labs & imaging results that were available during my care of the patient were reviewed by me and considered in my medical decision making (see chart for details).  This 39 year old male present emergency department with acute on chronic worsening of his left lower extremity wound.  He had untreated osteomyelitis from a year ago.  Now he is having fevers and chills.  He presents to the ED tachycardic, febrile, leukocytosis of 28,000, concerning for sepsis.  Code sepsis was called and sepsis work-up was initiated.  This includes blood cultures, lactate, additional infection labs, 30 cc/kg IV fluid bolus, broad-spectrum empiric antibiotics for lower extremity osteomyelitis per our protocol (for PCN allergies) with IV Vanc, IV aztreonam, IV Flagyl.  He is hemodynamically stable otherwise not appear to be in shock.  We will obtain repeat x-rays of lower extremity.  He may need another MRI once in the hospital but I do not think this is emergent for the ED.  Clinical Course as of Jan 05 1721  Thu Jan 06, 2020  1402 Within minutes of starting vancomycin patient broke out  into hikes.  Vanco stopped and pt given IV solumedrol and benadryl.  No evidence of airway compromise.  Unclear if this is a reaction to vancomycin (most likely) or a reaction to his ceftriaxone (finished 1 hour prior to symptom onset).  He's completed flagyl + rocephin.  Will admit   [MT]  1552 Admitted to family medicine   [MT]    Clinical Course User Index [MT] Ruddy Swire, Kermit BaloMatthew J, MD    Final Clinical Impression(s) / ED Diagnoses Final diagnoses:  Osteomyelitis of left tibia, unspecified type (HCC)  Sepsis, due to unspecified organism, unspecified whether acute organ dysfunction present [facility](HCC)    Rx / DC Orders ED Discharge Orders    None       Terald Sleeperrifan, Miller Edgington J, MD 01/06/20 1722

## 2020-01-07 ENCOUNTER — Encounter (HOSPITAL_COMMUNITY): Payer: Self-pay | Admitting: Family Medicine

## 2020-01-07 DIAGNOSIS — R509 Fever, unspecified: Secondary | ICD-10-CM | POA: Diagnosis not present

## 2020-01-07 DIAGNOSIS — A419 Sepsis, unspecified organism: Secondary | ICD-10-CM | POA: Diagnosis not present

## 2020-01-07 DIAGNOSIS — F17200 Nicotine dependence, unspecified, uncomplicated: Secondary | ICD-10-CM | POA: Diagnosis not present

## 2020-01-07 DIAGNOSIS — M869 Osteomyelitis, unspecified: Secondary | ICD-10-CM | POA: Diagnosis not present

## 2020-01-07 LAB — CBC
HCT: 41.7 % (ref 39.0–52.0)
Hemoglobin: 14 g/dL (ref 13.0–17.0)
MCH: 28.7 pg (ref 26.0–34.0)
MCHC: 33.6 g/dL (ref 30.0–36.0)
MCV: 85.5 fL (ref 80.0–100.0)
Platelets: 442 10*3/uL — ABNORMAL HIGH (ref 150–400)
RBC: 4.88 MIL/uL (ref 4.22–5.81)
RDW: 13.9 % (ref 11.5–15.5)
WBC: 23.6 10*3/uL — ABNORMAL HIGH (ref 4.0–10.5)
nRBC: 0 % (ref 0.0–0.2)

## 2020-01-07 LAB — BASIC METABOLIC PANEL
Anion gap: 10 (ref 5–15)
BUN: 15 mg/dL (ref 6–20)
CO2: 24 mmol/L (ref 22–32)
Calcium: 9.6 mg/dL (ref 8.9–10.3)
Chloride: 104 mmol/L (ref 98–111)
Creatinine, Ser: 0.96 mg/dL (ref 0.61–1.24)
GFR, Estimated: >60 mL/min (ref >60)
Glucose, Bld: 138 mg/dL — ABNORMAL HIGH (ref 70–99)
Potassium: 4.2 mmol/L (ref 3.5–5.1)
Sodium: 138 mmol/L (ref 135–145)

## 2020-01-07 LAB — HIV ANTIBODY (ROUTINE TESTING W REFLEX): HIV Screen 4th Generation wRfx: NONREACTIVE

## 2020-01-07 LAB — PROTIME-INR
INR: 1.2 (ref 0.8–1.2)
Prothrombin Time: 14.9 seconds (ref 11.4–15.2)

## 2020-01-07 MED ORDER — INFLUENZA VAC SPLIT QUAD 0.5 ML IM SUSY
0.5000 mL | PREFILLED_SYRINGE | INTRAMUSCULAR | Status: AC
  Administered 2020-01-09: 0.5 mL via INTRAMUSCULAR
  Filled 2020-01-07: qty 0.5

## 2020-01-07 MED ORDER — SODIUM CHLORIDE 0.9 % IV SOLN
2.0000 g | INTRAVENOUS | Status: DC
  Administered 2020-01-07 – 2020-01-13 (×7): 2 g via INTRAVENOUS
  Filled 2020-01-07 (×4): qty 2
  Filled 2020-01-07: qty 20
  Filled 2020-01-07: qty 2
  Filled 2020-01-07 (×2): qty 20
  Filled 2020-01-07: qty 2

## 2020-01-07 MED ORDER — LACTATED RINGERS IV SOLN: INTRAVENOUS | Status: AC

## 2020-01-07 MED ORDER — NICOTINE POLACRILEX 2 MG MT GUM
4.0000 mg | CHEWING_GUM | OROMUCOSAL | Status: DC | PRN
  Filled 2020-01-07: qty 2

## 2020-01-07 MED ORDER — NICOTINE 14 MG/24HR TD PT24
14.0000 mg | MEDICATED_PATCH | Freq: Every day | TRANSDERMAL | Status: DC
  Administered 2020-01-07 – 2020-01-09 (×3): 14 mg via TRANSDERMAL
  Filled 2020-01-07 (×6): qty 1

## 2020-01-07 MED ORDER — NICOTINE POLACRILEX 4 MG MT LOZG
4.0000 mg | LOZENGE | OROMUCOSAL | Status: DC | PRN
  Filled 2020-01-07: qty 1

## 2020-01-07 NOTE — Progress Notes (Addendum)
Family Medicine Teaching Service Daily Progress Note Intern Pager: [phone]  Patient name: [name] Medical record number: [id number] Date of birth: [birth year] Age: 39 y.o. Gender: male  Primary Care Provider: Bartholome Bill, MD Consultants: Orthopedics   Code Status: Full Code  Pt Overview and Major Events to Date:  10/21 Admitted  Assessment and Plan: [name] is a 39 y.o. male who presents with a purulent wound on the left knee, found to have evidence of osteomyelitis on XR. PMHx significant for: osteomyelitis of left knee (evaluated in the ED 1 year ago but left AMA), HTN, meningoencephalitis (at age 49, residual left-sided spasticity and speech difficulty).  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 Overall improving on broad spectrum antibiotics, transitioned from vancomycin to linezolid due to allergic reaction to vancomycin.  Orthopedics consulted, plan for surgical debridement sometime next week. VSS.  Last fever 101.81F 10/21 around 1100. Leukocytosis improving. Blood cx NGTD. Inflammatory markers obtained after antibiotics initiated: CRP 6.7, ESR 17. - Orthopedics consulted, appreciate involvement - plan for surgical debridement likely 10/27 - Linezolid (10/21-) - CTX (10/21-) - d/c metronidazole (10/21-10/23) - s/p vancomycin (10/21), allergic reaction -Tylenol PRN - f/ublood cultures  Bacteriuria Urine culture growing 5k colonies staphylococcus haemolyticus, susceptibilities pending. Likely contaminant. Already on broad-spectrum antibiotics. - continue antibiotics as above  HTN Patient takes lisinopril 10 mg for HTN at home.  BP 141/85. -restart lisinopril  Tobacco use disorder Currently smokes 3 to 4 cigarettes/day for the past 25 years. - Nicotine lozenges   Substance use disorder Smokes marijuana every other day. - encourage cessation  Microscopic Hematuria 6-10  rbc/hpf seen on UA.  - recheck outpatient  FEN/GI: regular diet PPx: enoxaparin  Disposition: med-surg  Subjective:  Feels good, no concerns. Knee is not bothering him.  Objective: Temp:  [98 F (36.7 C)-98.2 F (36.8 C)] 98.2 F (36.8 C) (10/23 0421) Pulse Rate:  [81-103] 95 (10/23 0421) Resp:  [18-19] 18 (10/23 0421) BP: (124-154)/(74-96) 141/85 (10/23 0421) SpO2:  [95 %-99 %] 99 % (10/23 0421) Physical Exam: General: Resting comfortably in bed, NAD Cardiovascular: RRR, no murmurs Respiratory: CTAB Abdomen: soft, non-tender, +BS Extremities: WWP, no edema Derm: see image below of left knee Left knee   Laboratory: Recent Labs  Lab 01/06/20 1116 01/07/20 0310 01/08/20 0326  WBC 28.2* 23.6* 17.2*  HGB 15.1 14.0 12.3*  HCT 45.3 41.7 37.2*  PLT 444* 442* 434*   Recent Labs  Lab 01/06/20 1116 01/07/20 0310 01/08/20 0326  NA 135 138 140  K 3.9 4.2 4.2  CL 97* 104 105  CO2 25 24 26   BUN 11 15 21*  CREATININE 1.00 0.96 0.97  CALCIUM 9.8 9.6 8.9  PROT  --   --  6.3*  BILITOT  --   --  0.7  ALKPHOS  --   --  58  ALT  --   --  20  AST  --   --  22  GLUCOSE 107* 138* 96    Imaging/Diagnostic Tests: No new imaging.   Zola Button, MD 01/08/2020, 6:23 AM PGY-1, [facility] Intern pager: [phone], text pages welcome

## 2020-01-07 NOTE — Progress Notes (Addendum)
Family Medicine Teaching Service Daily Progress Note Intern Pager: [phone]  Patient name: [name] Medical record number: [id number] Date of birth: 09/08/1980 Age: 39 y.o. Gender: male  Primary Care Provider: Verlon Au, MD Consultants: orthopedics Code Status: Full  Pt Overview and Major Events to Date:  Admitted on 01/06/20  Assessment and Plan: [name] is a 39 y.o. male presenting with a purulent wound on the left knee, found to have osteomyelitis on X ray. PMH is significant for HTN and osteomyelitis of the left knee last year for which he was hospitalized and left AMA.  Osteomyelitis Patient has experienced left knee pain for the past three years.  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 Previously diagnosed with osteomyelitis in that knee a year ago but left the ED AMA before treatment. This morning, He noticed the wound became purulent and painful at approximately 3am. The wound is . Purulent material is visible. He had a fever of 101.9 upon admission. Highest pulse rate recorded was 138, highest BP was 165/128; WBC of 28k, meeting 3/4 sepsis criteria. Pt was given IVF boluses and started on CTX, flagyl, and vanc.  He developed hives on his left arm in reaction to IV vancomycin which resolved after the vancomycin was discontinued. Pt then started on linezolid. WBC count today is 23.6.  -Tylenol PRN  -IV LR -orthopedic consulted, appreciate recs -f/u blood cultures : NGTD - continue broad spec abx: linezolid, ctx, flagyl - vitals per routine - continue morning CBC, BMP  HTN: Patient takes lisinopril for HTN at home - holding lisinopril while treating for sepsis.   Tobacco use disorder Smokes 3 to 4 cigarettes per day for the past 25 years. Currently smokes. - Nicotine lozenges   Substance abuse disorder Smokes marijuana every other day  Hx of meningoencephalitis w/ residual  spasticity Pt diagnosed with meningoencepahlitis at age 85.  has some residual spasticity on the left side with speech difficulty as a result   Microscopic Hematuria  6-10 rbc/hpf seen on UA.   - recheck at o/p follow up  FEN/GI: Regular diet Prophylaxis: Lovenox  FEN/GI: Regular diet PPx: Lovenox  Disposition: med/surg  Subjective:  Pt is very agitated and is saying he wants to leave AMA to go to his cousins funeral tomorrow.  He understands that he has a serious infection and has agreed to stay today for an orthopedic consult but is stating he still wants to leave to attend the funeral tomorrow.  He also states he would like to be transfered to the care of [facility] orthopedics. He denies any knee pain currently.   Objective: Temp:  [98.2 F (36.8 C)-101.9 F (38.8 C)] 98.2 F (36.8 C) (10/22 0918) Pulse Rate:  [81-138] 101 (10/22 0918) Resp:  [16-26] 18 (10/22 0918) BP: (101-165)/(69-128) 149/94 (10/22 0918) SpO2:  [95 %-100 %] 95 % (10/22 0918) Weight:  [89.4 kg] 89.4 kg (10/21 1111) Physical Exam: General: alert and agitated Cardiovascular: S1 and S2 normal, RRR, no murmurs Respiratory: CTA bilaterally Abdomen: no ndistended Extremities: no edema, no gross deformities SKIN: see image in chart ; ulcerated, indurated, circular and approximately 1 cm in width and 2 cm in height  Laboratory: Recent Labs  Lab 01/06/20 1116 01/07/20 0310  WBC 28.2* 23.6*  HGB 15.1 14.0  HCT 45.3 41.7  PLT 444* 442*   Recent Labs  Lab 01/06/20 1116 01/07/20 0310  NA 135 138  K  3.9 4.2  CL 97* 104  CO2 25 24  BUN 11 15  CREATININE 1.00 0.96  CALCIUM 9.8 9.6  GLUCOSE 107* 138*      Imaging/Diagnostic Tests: DG Chest 1 View  Result Date: 01/06/2020 CLINICAL DATA:  Multifocal osteomyelitis EXAM: CHEST  1 VIEW COMPARISON:  06/28/2013 FINDINGS: The heart size and mediastinal contours are within normal limits. Both lungs are clear. The visualized skeletal structures are  unremarkable. IMPRESSION: No active disease. Electronically Signed   By: Helyn Numbers MD   On: 01/06/2020 13:23   DG Knee 2 Views Left  Result Date: 01/06/2020 CLINICAL DATA:  Osteomyelitis EXAM: LEFT KNEE - 1-2 VIEW COMPARISON:  None. FINDINGS: Two view radiograph left knee demonstrates superficial ulceration and soft tissue swelling anterior to the anterior tibial tubercle with erosion of the tubercle itself in keeping with subjacent osteomyelitis. There is associated thickening of the patellar tendon, likely related to inflammation or trauma. Hoffa fat is preserved. No effusion. No fracture or dislocation. Medial and lateral joint spaces are preserved. Patellofemoral joint space is not well profiled. IMPRESSION: Superficial wound and associated soft tissue swelling with subjacent osteomyelitis involving the anterior tibial tubercle. Patellar tendon thickening in keeping with inflammation or trauma. Electronically Signed   By: Helyn Numbers MD   On: 01/06/2020 13:23   DG Tibia/Fibula Left  Result Date: 01/06/2020 CLINICAL DATA:  Osteomyelitis EXAM: LEFT TIBIA AND FIBULA - 2 VIEW COMPARISON:  11/16/2018 FINDINGS: There is persistent soft tissue swelling seen anterior to the anterior tibial tubercle within associated soft tissue defect in keeping with a a superficial ulceration. There is progressive, mild erosion involving the anterior tibial tubercle when compared to prior examination in keeping with changes of osteomyelitis. There is mild thickening of the inferior patellar tendon, possibly related to inflammation or trauma. No fracture or dislocation. There is soft tissue swelling superficial to the lateral malleolus with associated small superficial ulceration identified. IMPRESSION: Soft tissue wound, swelling, and subjacent osteomyelitis involving the anterior tibial tubercle. Patellar tendon thickening in keeping with inflammation or trauma Soft tissue swelling superficial to the lateral  malleolus without associated osseous erosion. And Electronically Signed   By: Helyn Numbers MD   On: 01/06/2020 13:21    Howard Pouch, Medical Student 01/07/2020, 9:44 AM OMS-IV AI, [facility] FPTS Intern pager: [phone], text pages welcome     RESIDENT ATTESTATION OF STUDENT NOTE   I personally evaluated this patient along with the student, and verified all aspects of the history, physical exam, and medical decision making as documented by the student. I agree with the student's documentation and have made all necessary edits.    Katha Cabal, DO PGY-1, [facility] 01/07/2020 6:22 PM

## 2020-01-07 NOTE — Consult Note (Addendum)
ORTHOPAEDIC CONSULTATION  REQUESTING PHYSICIAN: McDiarmid, Leighton Roach, MD  Chief Complaint: Chronic ulcer left tibial tubercle.  HPI: [name] is a 39 y.o. male who presents with chronic ulcer left tibial tubercle.  Patient is status post debridement of a bursitis at this site 6 years ago at [facility].  Patient complains of drainage and pain.  Past Medical History:  Diagnosis Date  . Depression   . Encephalitis   . Hypertension   . Meningitis   . 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 Stroke [facility])    Past Surgical History:  Procedure Laterality Date  . JOINT REPLACEMENT    . KNEE SURGERY     Social History   Socioeconomic History  . Marital status: Single    Spouse name: Not on file  . Number of children: Not on file  . Years of education: Not on file  . Highest education level: Not on file  Occupational History  . Not on file  Tobacco Use  . Smoking status: Current Every Day Smoker    Packs/day: 0.25  . Smokeless tobacco: Never Used  Vaping Use  . Vaping Use: Never used  Substance and Sexual Activity  . Alcohol use: No  . Drug use: Yes    Types: Marijuana    Comment: not used in 3 weeks  . Sexual activity: Not on file  Other Topics Concern  . Not on file  Social History Narrative  . Not on file   Social Determinants of Health   Financial Resource Strain:   . Difficulty of Paying Living Expenses: Not on file  Food Insecurity:   . Worried About Programme researcher, broadcasting/film/video in the Last Year: Not on file  . Ran Out of Food in the Last Year: Not on file  Transportation Needs:   . Lack of Transportation (Medical): Not on file  . Lack of Transportation (Non-Medical): Not on file  Physical Activity:   . Days of Exercise per Week: Not on file  . Minutes of Exercise per Session: Not on file  Stress:   . Feeling of Stress : Not on file  Social Connections:   . Frequency of Communication with Friends and Family:  Not on file  . Frequency of Social Gatherings with Friends and Family: Not on file  . Attends Religious Services: Not on file  . Active Member of Clubs or Organizations: Not on file  . Attends Banker Meetings: Not on file  . Marital Status: Not on file   Family History  Problem Relation Age of Onset  . Hypertension Mother   . Hypertension Father    - negative except otherwise stated in the family history section Allergies  Allergen Reactions  . Vancomycin Hives    Itching and body hives within minutes of initiating Vancomycin infusion  . Ciprofloxacin Other (See Comments)    Reaction=hypertension  . Hydrocodone-Acetaminophen Hives  . Ibuprofen Hives  . Penicillins Hives and Nausea Only    Did it involve swelling of the face/tongue/throat, SOB, or low BP? N Did it involve sudden or severe rash/hives, skin peeling, or any reaction on the inside of your mouth or nose? N Did you need to seek medical attention at a hospital or doctor's office? N When did it last happen?3 months ago If all above answers are "NO", may proceed with cephalosporin use.   Marland Kitchen Propoxyphene N-Acetaminophen Hives  . Tramadol Hcl Hives  Prior to Admission medications   Medication Sig Start Date End Date Taking? Authorizing Provider  lisinopril (ZESTRIL) 10 MG tablet Take 10 mg by mouth daily.    Yes [provider]   DG Chest 1 View  Result Date: 01/06/2020 CLINICAL DATA:  Multifocal osteomyelitis EXAM: CHEST  1 VIEW COMPARISON:  06/28/2013 FINDINGS: The heart size and mediastinal contours are within normal limits. Both lungs are clear. The visualized skeletal structures are unremarkable. IMPRESSION: No active disease. Electronically Signed   By: Helyn Numbers MD   On: 01/06/2020 13:23   DG Knee 2 Views Left  Result Date: 01/06/2020 CLINICAL DATA:  Osteomyelitis EXAM: LEFT KNEE - 1-2 VIEW COMPARISON:  None. FINDINGS: Two view radiograph left knee demonstrates superficial  ulceration and soft tissue swelling anterior to the anterior tibial tubercle with erosion of the tubercle itself in keeping with subjacent osteomyelitis. There is associated thickening of the patellar tendon, likely related to inflammation or trauma. Hoffa fat is preserved. No effusion. No fracture or dislocation. Medial and lateral joint spaces are preserved. Patellofemoral joint space is not well profiled. IMPRESSION: Superficial wound and associated soft tissue swelling with subjacent osteomyelitis involving the anterior tibial tubercle. Patellar tendon thickening in keeping with inflammation or trauma. Electronically Signed   By: Helyn Numbers MD   On: 01/06/2020 13:23   DG Tibia/Fibula Left  Result Date: 01/06/2020 CLINICAL DATA:  Osteomyelitis EXAM: LEFT TIBIA AND FIBULA - 2 VIEW COMPARISON:  11/16/2018 FINDINGS: There is persistent soft tissue swelling seen anterior to the anterior tibial tubercle within associated soft tissue defect in keeping with a a superficial ulceration. There is progressive, mild erosion involving the anterior tibial tubercle when compared to prior examination in keeping with changes of osteomyelitis. There is mild thickening of the inferior patellar tendon, possibly related to inflammation or trauma. No fracture or dislocation. There is soft tissue swelling superficial to the lateral malleolus with associated small superficial ulceration identified. IMPRESSION: Soft tissue wound, swelling, and subjacent osteomyelitis involving the anterior tibial tubercle. Patellar tendon thickening in keeping with inflammation or trauma Soft tissue swelling superficial to the lateral malleolus without associated osseous erosion. And Electronically Signed   By: Helyn Numbers MD   On: 01/06/2020 13:21   - pertinent xrays, CT, MRI studies were reviewed and independently interpreted  Positive ROS: All other systems have been reviewed and were otherwise negative with the exception of those  mentioned in the HPI and as above.  Physical Exam: General: Alert, no acute distress Psychiatric: Patient is competent for consent with normal mood and affect Lymphatic: No axillary or cervical lymphadenopathy Cardiovascular: No pedal edema Respiratory: No cyanosis, no use of accessory musculature GI: No organomegaly, abdomen is soft and non-tender    Images:  @ENCIMAGES @  Labs:  Lab Results  Component Value Date   ESRSEDRATE 47 (H) 11/16/2018   ESRSEDRATE 10 05/05/2008   ESRSEDRATE 5 06/06/2007   CRP 13.9 (H) 11/16/2018   REPTSTATUS PENDING 01/06/2020   GRAMSTAIN  11/16/2018    FEW WBC PRESENT, PREDOMINANTLY PMN FEW GRAM POSITIVE COCCI Performed at [facility], [address] [place], [place]    CULT  01/06/2020    NO GROWTH < 24 HOURS Performed at [facility], [address] [place], [place]    [facility] ESCHERICHIA COLI 11/16/2018    Lab Results  Component Value Date   ALBUMIN 3.5 11/17/2018   ALBUMIN 3.9 11/16/2018   ALBUMIN 4.1 11/06/2015  Neurologic: Patient does not have protective sensation bilateral lower extremities.   MUSCULOSKELETAL:   Skin: Examination patient has a necrotic ulcer over the tibial tubercle left knee there is exposed bone necrotic tissue.  There is no fluctuance surrounding soft tissue there is no effusion in the knee.  Patient also has an ischemic ulcer over the lateral malleolus left ankle.  Assessment: Assessment: Osteomyelitis and ulceration tibial tubercle left knee.  Plan: Agree with admission IV antibiotics and I will plan for surgical debridement next week.  Discussed with the patient of the risks and difficulty with achieving resolution of this deep bone infection.  Thank you for the consult and the opportunity to see Mr. Bloodgood  Aldean Baker, MD [facility] [phone] 2:48 PM

## 2020-01-07 NOTE — ED Notes (Signed)
Ordered breakfast--[name] 

## 2020-01-07 NOTE — H&P (View-Only) (Signed)
ORTHOPAEDIC CONSULTATION  REQUESTING PHYSICIAN: McDiarmid, Leighton Roach, MD  Chief Complaint: Chronic ulcer left tibial tubercle.  HPI: [name] is a 39 y.o. male who presents with chronic ulcer left tibial tubercle.  Patient is status post debridement of a bursitis at this site 6 years ago at [facility].  Patient complains of drainage and pain.  Past Medical History:  Diagnosis Date  . Depression   . Encephalitis   . Hypertension   . Meningitis   . 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 Stroke [facility])    Past Surgical History:  Procedure Laterality Date  . JOINT REPLACEMENT    . KNEE SURGERY     Social History   Socioeconomic History  . Marital status: Single    Spouse name: Not on file  . Number of children: Not on file  . Years of education: Not on file  . Highest education level: Not on file  Occupational History  . Not on file  Tobacco Use  . Smoking status: Current Every Day Smoker    Packs/day: 0.25  . Smokeless tobacco: Never Used  Vaping Use  . Vaping Use: Never used  Substance and Sexual Activity  . Alcohol use: No  . Drug use: Yes    Types: Marijuana    Comment: not used in 3 weeks  . Sexual activity: Not on file  Other Topics Concern  . Not on file  Social History Narrative  . Not on file   Social Determinants of Health   Financial Resource Strain:   . Difficulty of Paying Living Expenses: Not on file  Food Insecurity:   . Worried About Programme researcher, broadcasting/film/video in the Last Year: Not on file  . Ran Out of Food in the Last Year: Not on file  Transportation Needs:   . Lack of Transportation (Medical): Not on file  . Lack of Transportation (Non-Medical): Not on file  Physical Activity:   . Days of Exercise per Week: Not on file  . Minutes of Exercise per Session: Not on file  Stress:   . Feeling of Stress : Not on file  Social Connections:   . Frequency of Communication with Friends and Family:  Not on file  . Frequency of Social Gatherings with Friends and Family: Not on file  . Attends Religious Services: Not on file  . Active Member of Clubs or Organizations: Not on file  . Attends Banker Meetings: Not on file  . Marital Status: Not on file   Family History  Problem Relation Age of Onset  . Hypertension Mother   . Hypertension Father    - negative except otherwise stated in the family history section Allergies  Allergen Reactions  . Vancomycin Hives    Itching and body hives within minutes of initiating Vancomycin infusion  . Ciprofloxacin Other (See Comments)    Reaction=hypertension  . Hydrocodone-Acetaminophen Hives  . Ibuprofen Hives  . Penicillins Hives and Nausea Only    Did it involve swelling of the face/tongue/throat, SOB, or low BP? N Did it involve sudden or severe rash/hives, skin peeling, or any reaction on the inside of your mouth or nose? N Did you need to seek medical attention at a hospital or doctor's office? N When did it last happen?3 months ago If all above answers are "NO", may proceed with cephalosporin use.   Marland Kitchen Propoxyphene N-Acetaminophen Hives  . Tramadol Hcl Hives  Prior to Admission medications   Medication Sig Start Date End Date Taking? Authorizing Provider  lisinopril (ZESTRIL) 10 MG tablet Take 10 mg by mouth daily.    Yes [provider]   DG Chest 1 View  Result Date: 01/06/2020 CLINICAL DATA:  Multifocal osteomyelitis EXAM: CHEST  1 VIEW COMPARISON:  06/28/2013 FINDINGS: The heart size and mediastinal contours are within normal limits. Both lungs are clear. The visualized skeletal structures are unremarkable. IMPRESSION: No active disease. Electronically Signed   By: Helyn Numbers MD   On: 01/06/2020 13:23   DG Knee 2 Views Left  Result Date: 01/06/2020 CLINICAL DATA:  Osteomyelitis EXAM: LEFT KNEE - 1-2 VIEW COMPARISON:  None. FINDINGS: Two view radiograph left knee demonstrates superficial  ulceration and soft tissue swelling anterior to the anterior tibial tubercle with erosion of the tubercle itself in keeping with subjacent osteomyelitis. There is associated thickening of the patellar tendon, likely related to inflammation or trauma. Hoffa fat is preserved. No effusion. No fracture or dislocation. Medial and lateral joint spaces are preserved. Patellofemoral joint space is not well profiled. IMPRESSION: Superficial wound and associated soft tissue swelling with subjacent osteomyelitis involving the anterior tibial tubercle. Patellar tendon thickening in keeping with inflammation or trauma. Electronically Signed   By: Helyn Numbers MD   On: 01/06/2020 13:23   DG Tibia/Fibula Left  Result Date: 01/06/2020 CLINICAL DATA:  Osteomyelitis EXAM: LEFT TIBIA AND FIBULA - 2 VIEW COMPARISON:  11/16/2018 FINDINGS: There is persistent soft tissue swelling seen anterior to the anterior tibial tubercle within associated soft tissue defect in keeping with a a superficial ulceration. There is progressive, mild erosion involving the anterior tibial tubercle when compared to prior examination in keeping with changes of osteomyelitis. There is mild thickening of the inferior patellar tendon, possibly related to inflammation or trauma. No fracture or dislocation. There is soft tissue swelling superficial to the lateral malleolus with associated small superficial ulceration identified. IMPRESSION: Soft tissue wound, swelling, and subjacent osteomyelitis involving the anterior tibial tubercle. Patellar tendon thickening in keeping with inflammation or trauma Soft tissue swelling superficial to the lateral malleolus without associated osseous erosion. And Electronically Signed   By: Helyn Numbers MD   On: 01/06/2020 13:21   - pertinent xrays, CT, MRI studies were reviewed and independently interpreted  Positive ROS: All other systems have been reviewed and were otherwise negative with the exception of those  mentioned in the HPI and as above.  Physical Exam: General: Alert, no acute distress Psychiatric: Patient is competent for consent with normal mood and affect Lymphatic: No axillary or cervical lymphadenopathy Cardiovascular: No pedal edema Respiratory: No cyanosis, no use of accessory musculature GI: No organomegaly, abdomen is soft and non-tender    Images:  @ENCIMAGES @  Labs:  Lab Results  Component Value Date   ESRSEDRATE 47 (H) 11/16/2018   ESRSEDRATE 10 05/05/2008   ESRSEDRATE 5 06/06/2007   CRP 13.9 (H) 11/16/2018   REPTSTATUS PENDING 01/06/2020   GRAMSTAIN  11/16/2018    FEW WBC PRESENT, PREDOMINANTLY PMN FEW GRAM POSITIVE COCCI Performed at [facility], [address] [place], [place]    CULT  01/06/2020    NO GROWTH < 24 HOURS Performed at [facility], [address] [place], [place]    [facility] ESCHERICHIA COLI 11/16/2018    Lab Results  Component Value Date   ALBUMIN 3.5 11/17/2018   ALBUMIN 3.9 11/16/2018   ALBUMIN 4.1 11/06/2015  Neurologic: Patient does not have protective sensation bilateral lower extremities.   MUSCULOSKELETAL:   Skin: Examination patient has a necrotic ulcer over the tibial tubercle left knee there is exposed bone necrotic tissue.  There is no fluctuance surrounding soft tissue there is no effusion in the knee.  Patient also has an ischemic ulcer over the lateral malleolus left ankle.  Assessment: Assessment: Osteomyelitis and ulceration tibial tubercle left knee.  Plan: Agree with admission IV antibiotics and I will plan for surgical debridement next week.  Discussed with the patient of the risks and difficulty with achieving resolution of this deep bone infection.  Thank you for the consult and the opportunity to see Mr. Bloodgood  Aldean Baker, MD [facility] [phone] 2:48 PM

## 2020-01-07 NOTE — ED Notes (Signed)
Pt ambulatory to and from restroom with steady gait 

## 2020-01-07 NOTE — ED Notes (Signed)
Lunch Tray Ordered @ 1020.  

## 2020-01-08 DIAGNOSIS — F17200 Nicotine dependence, unspecified, uncomplicated: Secondary | ICD-10-CM | POA: Diagnosis not present

## 2020-01-08 DIAGNOSIS — M869 Osteomyelitis, unspecified: Secondary | ICD-10-CM | POA: Diagnosis not present

## 2020-01-08 DIAGNOSIS — M86162 Other acute osteomyelitis, left tibia and fibula: Secondary | ICD-10-CM

## 2020-01-08 DIAGNOSIS — I10 Essential (primary) hypertension: Secondary | ICD-10-CM | POA: Diagnosis not present

## 2020-01-08 DIAGNOSIS — A419 Sepsis, unspecified organism: Secondary | ICD-10-CM | POA: Diagnosis not present

## 2020-01-08 LAB — URINE CULTURE: Culture: 5000 — AB

## 2020-01-08 LAB — COMPREHENSIVE METABOLIC PANEL
ALT: 20 U/L (ref 0–44)
AST: 22 U/L (ref 15–41)
Albumin: 3.1 g/dL — ABNORMAL LOW (ref 3.5–5.0)
Alkaline Phosphatase: 58 U/L (ref 38–126)
Anion gap: 9 (ref 5–15)
BUN: 21 mg/dL — ABNORMAL HIGH (ref 6–20)
CO2: 26 mmol/L (ref 22–32)
Calcium: 8.9 mg/dL (ref 8.9–10.3)
Chloride: 105 mmol/L (ref 98–111)
Creatinine, Ser: 0.97 mg/dL (ref 0.61–1.24)
GFR, Estimated: >60 mL/min (ref >60)
Glucose, Bld: 96 mg/dL (ref 70–99)
Potassium: 4.2 mmol/L (ref 3.5–5.1)
Sodium: 140 mmol/L (ref 135–145)
Total Bilirubin: 0.7 mg/dL (ref 0.3–1.2)
Total Protein: 6.3 g/dL — ABNORMAL LOW (ref 6.5–8.1)

## 2020-01-08 LAB — C-REACTIVE PROTEIN: CRP: 6.7 mg/dL — ABNORMAL HIGH (ref <1.0)

## 2020-01-08 LAB — CBC
HCT: 37.2 % — ABNORMAL LOW (ref 39.0–52.0)
Hemoglobin: 12.3 g/dL — ABNORMAL LOW (ref 13.0–17.0)
MCH: 28.2 pg (ref 26.0–34.0)
MCHC: 33.1 g/dL (ref 30.0–36.0)
MCV: 85.3 fL (ref 80.0–100.0)
Platelets: 434 10*3/uL — ABNORMAL HIGH (ref 150–400)
RBC: 4.36 MIL/uL (ref 4.22–5.81)
RDW: 13.9 % (ref 11.5–15.5)
WBC: 17.2 10*3/uL — ABNORMAL HIGH (ref 4.0–10.5)
nRBC: 0 % (ref 0.0–0.2)

## 2020-01-08 LAB — SEDIMENTATION RATE: Sed Rate: 17 mm/hr — ABNORMAL HIGH (ref 0–16)

## 2020-01-08 MED ORDER — LISINOPRIL 10 MG PO TABS
10.0000 mg | ORAL_TABLET | Freq: Every day | ORAL | Status: DC
  Administered 2020-01-08 – 2020-01-14 (×7): 10 mg via ORAL
  Filled 2020-01-08 (×7): qty 1

## 2020-01-08 NOTE — Progress Notes (Signed)
Family Medicine Teaching Service Daily Progress Note Intern Pager: [phone]  Patient name: [name] Medical record number: [id number] Date of birth: 02-14-1981 Age: 39 y.o. Gender: male  Primary Care Provider: Verlon Au, MD Consultants: Orthopedics   Code Status: Full Code  Pt Overview and Major Events to Date:  10/21 Admitted  Assessment and Plan: [name] is a 39 y.o. male who presented with a purulent wound on the left knee, found to have evidence of osteomyelitis on XR. PMHx significant for: osteomyelitis of left knee (evaluated in the ED 1 year ago but left AMA), HTN, meningoencephalitis (at age 56, residual left-sided spasticity and speech difficulty).  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  Leukocytosis resolved and inflammatory markers downtrending.  Afebrile and BC NGTD > 48 hours.  Scheduled for excision debridement on 10/27 with Dr. Lajoyce Corners. -Orthopedics on board, appreciate recommendations and debridement next week -ID consulted and signed off, rec to continue ceftriaxone as monotherapy -Cont ceftriaxone IV (10/21-) -Monitor BC, vitals -Tylenol as needed for discomfort -If re-fevers, obtain repeat blood cultures and restart vancomycin (at slow rate to avoid previously experienced infusion syndrome) per ID  Hypertension: Chronic, stable. Systolic BP 120-145 overnight. -Monitor BP -Continue home lisinopril 10 mg, restarted on 10/23  Tobacco use disorder: Chronic, stable. Currently smokes 3 to 4 cigarettes/day for the past 25 years. - Nicotine lozenges PRN  Substance use disorder: Chronic, stable. Smokes marijuana every other day. - Encourage cessation  Microscopic Hematuria: Acute, mild. Clean-catch specimen,6-10 rbc/hpf seen on UA. CR WNL without any associated abdominal pain. -Will need repeat U/a on hospital follow-up visit  FEN/GI: regular diet PPx: Lovenox  Disposition: Will remain hospitalized through  debridement scheduled for 10/27  Subjective:  No acute events overnight.  Doing well.  Anxiously awaiting debridement later this week.  Objective: Temp:  [98 F (36.7 C)-99 F (37.2 C)] 99 F (37.2 C) (10/23 2128) Pulse Rate:  [63-95] 63 (10/23 2128) Resp:  [16-18] 16 (10/23 2128) BP: (133-145)/(77-90) 142/77 (10/23 2128) SpO2:  [99 %-100 %] 100 % (10/23 2128) Physical Exam:  General: Alert, NAD, laying in bed HEENT: NCAT, MMM Lungs: No increased WOB  Msk: Moves all extremities spontaneously, full ROM through knee and hip joint on the left  Ext: Warm, dry, palpable dorsalis pedis pulses bilaterally, no edema  Derm: See picture below.  Necrotic ulcer present on left knee over tibial tubercle exposing bone through his wound.  No surrounding erythema or fluctuance.        Laboratory: Recent Labs  Lab 01/06/20 1116 01/07/20 0310 01/08/20 0326  WBC 28.2* 23.6* 17.2*  HGB 15.1 14.0 12.3*  HCT 45.3 41.7 37.2*  PLT 444* 442* 434*   Recent Labs  Lab 01/06/20 1116 01/07/20 0310 01/08/20 0326  NA 135 138 140  K 3.9 4.2 4.2  CL 97* 104 105  CO2 25 24 26   BUN 11 15 21*  CREATININE 1.00 0.96 0.97  CALCIUM 9.8 9.6 8.9  PROT  --   --  6.3*  BILITOT  --   --  0.7  ALKPHOS  --   --  58  ALT  --   --  20  AST  --   --  22  GLUCOSE 107* 138* 96    Imaging/Diagnostic Tests: No new imaging.   , DO 01/08/2020, 9:53 PM PGY-3, [facility] FPTS Intern pager: [phone], text pages welcome

## 2020-01-08 NOTE — Consult Note (Signed)
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 ucx CoNS likely due to prior foley, representing assymptomatic bacteriuria. Source appear to be the left knee although no significant cellulitic change or abcess  Has focal left forearm pruritus within minutes vanc infusion and resolved within minutes of its stopping; tolerated prior vancomycin; this is likely a Vancomycin infusion syndrome rather than immediate hypersensitivity  At this time no mrsa from blood cx, reasonable to hold linezolid, and continue empiric ceftriaxone given presentation.   Await ortho debridement and culture/path this coming Wednesday      Plan: 1. Stop linezolid 2. Continue ceftriaxone 3. If sepsis recur, repeat bcx and add back vancomycin, discuss with pharmacy to run at slower rate to avoid vanc infusion syndrome 4. Please reengage infectious disease after I&D is done, we'll sign off   Active Problems:   Osteomyelitis (HCC)   Scheduled Meds: . enoxaparin (LOVENOX) injection  40 mg Subcutaneous Q24H  . influenza vac split quadrivalent PF  0.5 mL Intramuscular Tomorrow-1000  . lisinopril  10 mg Oral Daily  . nicotine  14 mg Transdermal Daily   Continuous Infusions: . cefTRIAXone (ROCEPHIN)  IV Stopped (01/07/20 1611)  . linezolid (ZYVOX) IV 600 mg (01/08/20 0942)   PRN Meds:.acetaminophen **OR** acetaminophen, nicotine polacrilex, polyethylene glycol  HPI: [name] is a 39 y.o. 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 He had prior bursitis that was drained around 2009, given 2 weeks bactrim. The wound appeared to never healed completely. In august 2020 had mri left knee that showed OM of the tibia; he was admitted for purulent drainage of the wound but left AMA prior to getting treatment. Prior wound cx showed mssa and ecoli  Epic chart review showed he had multiple just ED visits and getting short courses of abx several years ago  Several previous wound cx grew ecoli and mssa  He endorsed intermittent increased pain in the left knee along with purulent drainage. Had been doing well since 10/2018. However, the day of admission several hours pain/purulent discharge out of the ulcer prompting admission  No other focal pain. No f/c. No uti sx. No cough  He was febrile this admission, with leukocytosis 23, and started on empiric abx. vanc caused immediate itch. Currently on ceftriaxone/linezoid  Blood cultures so far is negative  Xray knee indicate chronic OM of the tibia. Ortho plan to I&D Wednesday this coming weak  His fever had defervesced along with improving leukocytosis  No recent foley. No hardware No f/c at home No recent abx Eating well at home   Review of Systems: ROS Negative 11 point ros unless mentioned above  Past Medical History:  Diagnosis Date  . Depression   . Encephalitis   .  Hypertension   . Meningitis   . 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 Stroke [facility])     Social History   Tobacco Use  . Smoking status: Current Every Day Smoker    Packs/day: 0.25  . Smokeless tobacco: Never Used  Vaping Use  . Vaping Use: Never used  Substance Use Topics  . Alcohol use: No  .  Drug use: Yes    Types: Marijuana    Comment: not used in 3 weeks    Family History  Problem Relation Age of Onset  . Hypertension Mother   . Hypertension Father    Allergies  Allergen Reactions  . Vancomycin Hives    Itching and body hives within minutes of initiating Vancomycin infusion  . Ciprofloxacin Other (See Comments)    Reaction=hypertension  . Hydrocodone-Acetaminophen Hives  . Ibuprofen Hives  . Penicillins Hives and Nausea Only    Did it involve swelling of the face/tongue/throat, SOB, or low BP? N Did it involve sudden or severe rash/hives, skin peeling, or any reaction on the inside of your mouth or nose? N Did you need to seek medical attention at a hospital or doctor's office? N When did it last happen?3 months ago If all above answers are "NO", may proceed with cephalosporin use.   Marland Kitchen Propoxyphene N-Acetaminophen Hives  . Tramadol Hcl Hives    OBJECTIVE: Blood pressure (!) 141/85, pulse 95, temperature 98.2 F (36.8 C), temperature source Oral, resp. rate 18, height 6\' 1"  (1.854 m), weight 89.4 kg, SpO2 99 %.  Physical Exam No distress, stuttering problem Normocephalic; per; conj clear; eomi Neck supple; prior tracheostomy scar Lungs normal respiratory effort; clear cv rrr no mrg abd s/nt Ext no edema msk over tibial tuberle left knee is a 1 inch ulceration with hard bony hypertrophy; nontender; no purulence; necrotic debride within ulcer Skin no rash Neuro left sided paresis, nonfocal otherwise Psych alert/oriented  Lab Results Lab Results  Component Value Date   WBC 17.2 (H) 01/08/2020   HGB 12.3 (L) 01/08/2020   HCT 37.2 (L) 01/08/2020   MCV 85.3 01/08/2020   PLT 434 (H) 01/08/2020    Lab Results  Component Value Date   CREATININE 0.97 01/08/2020   BUN 21 (H) 01/08/2020   NA 140 01/08/2020   K 4.2 01/08/2020   CL 105 01/08/2020   CO2 26 01/08/2020    Lab Results  Component Value Date   ALT 20 01/08/2020   AST 22 01/08/2020    ALKPHOS 58 01/08/2020   BILITOT 0.7 01/08/2020     Microbiology: Recent Results (from the past 240 hour(s))  Blood Culture (routine x 2)     Status: None (Preliminary result)   Collection Time: 01/06/20 11:58 AM   Specimen: BLOOD  Result Value Ref Range Status   Specimen Description BLOOD RIGHT ANTECUBITAL  Final   Special Requests   Final    BOTTLES DRAWN AEROBIC ONLY Blood Culture results may not be optimal due to an inadequate volume of blood received in culture bottles   Culture   Final    NO GROWTH 2 DAYS Performed at [facility], [address], [place], [place]    Report Status PENDING  Incomplete  Urine culture     Status: Abnormal   Collection Time: 01/06/20 11:58 AM   Specimen: In/Out Cath Urine  Result Value Ref Range Status   Specimen Description IN/OUT CATH URINE  Final  Special Requests   Final    NONE Performed at [facility], [address] [place], [place] [address]    Culture 5,000 COLONIES/mL STAPHYLOCOCCUS HAEMOLYTICUS (A)  Final   Report Status 01/08/2020 FINAL  Final   Organism ID, Bacteria STAPHYLOCOCCUS HAEMOLYTICUS (A)  Final      Susceptibility   Staphylococcus haemolyticus - MIC*    CIPROFLOXACIN <=0.5 SENSITIVE Sensitive     GENTAMICIN <=0.5 SENSITIVE Sensitive     NITROFURANTOIN <=16 SENSITIVE Sensitive     OXACILLIN >=4 RESISTANT Resistant     TETRACYCLINE <=1 SENSITIVE Sensitive     VANCOMYCIN 1 SENSITIVE Sensitive     TRIMETH/SULFA 80 RESISTANT Resistant     CLINDAMYCIN <=0.25 SENSITIVE Sensitive     RIFAMPIN <=0.5 SENSITIVE Sensitive     Inducible Clindamycin NEGATIVE Sensitive     * 5,000 COLONIES/mL STAPHYLOCOCCUS HAEMOLYTICUS  Respiratory Panel by RT PCR (Flu A&B, Covid) - Nasopharyngeal Swab     Status: None   Collection Time: 01/06/20 11:59 AM   Specimen: Nasopharyngeal Swab  Result Value Ref Range Status   SARS Coronavirus 2 by RT PCR NEGATIVE NEGATIVE Final    Comment: (NOTE) SARS-CoV-2 target nucleic  acids are NOT DETECTED.  The SARS-CoV-2 RNA is generally detectable in upper respiratoy specimens during the acute phase of infection. The lowest concentration of SARS-CoV-2 viral copies this assay can detect is 131 copies/mL. A negative result does not preclude SARS-Cov-2 infection and should not be used as the sole basis for treatment or other patient management decisions. A negative result may occur with  improper specimen collection/handling, submission of specimen other than nasopharyngeal swab, presence of viral mutation(s) within the areas targeted by this assay, and inadequate number of viral copies (<131 copies/mL). A negative result must be combined with clinical observations, patient history, and epidemiological information. The expected result is Negative.  Fact Sheet for Patients:  https://www.moore.com/  Fact Sheet for Healthcare Providers:  https://www.young.biz/  This test is no t yet approved or cleared by the [place] FDA and  has been authorized for detection and/or diagnosis of SARS-CoV-2 by FDA under an Emergency Use Authorization (EUA). This EUA will remain  in effect (meaning this test can be used) for the duration of the COVID-19 declaration under Section 564(b)(1) of the Act, 21 U.S.C. section 360bbb-3(b)(1), unless the authorization is terminated or revoked sooner.     Influenza A by PCR NEGATIVE NEGATIVE Final   Influenza B by PCR NEGATIVE NEGATIVE Final    Comment: (NOTE) The Xpert Xpress SARS-CoV-2/FLU/RSV assay is intended as an aid in  the diagnosis of influenza from Nasopharyngeal swab specimens and  should not be used as a sole basis for treatment. Nasal washings and  aspirates are unacceptable for Xpert Xpress SARS-CoV-2/FLU/RSV  testing.  Fact Sheet for Patients: https://www.moore.com/  Fact Sheet for Healthcare Providers: https://www.young.biz/  This test  is not yet approved or cleared by the [place] FDA and  has been authorized for detection and/or diagnosis of SARS-CoV-2 by  FDA under an Emergency Use Authorization (EUA). This EUA will remain  in effect (meaning this test can be used) for the duration of the  Covid-19 declaration under Section 564(b)(1) of the Act, 21  U.S.C. section 360bbb-3(b)(1), unless the authorization is  terminated or revoked. Performed at [facility], [address] [place], [place] [address]   Blood Culture (routine x 2)     Status: None (Preliminary result)   Collection Time: 01/06/20 12:03  PM   Specimen: BLOOD  Result Value Ref Range Status   Specimen Description BLOOD BLOOD RIGHT FOREARM  Final   Special Requests   Final    BOTTLES DRAWN AEROBIC AND ANAEROBIC Blood Culture adequate volume   Culture   Final    NO GROWTH 2 DAYS Performed at [facility], [address] [place], [place] [address]    Report Status PENDING  Incomplete    Raymondo Band, MD [facility] [phone] pager    01/08/2020, 10:52 AM

## 2020-01-09 DIAGNOSIS — A419 Sepsis, unspecified organism: Secondary | ICD-10-CM | POA: Diagnosis not present

## 2020-01-09 DIAGNOSIS — M869 Osteomyelitis, unspecified: Secondary | ICD-10-CM | POA: Diagnosis not present

## 2020-01-09 LAB — CBC
HCT: 39.2 % (ref 39.0–52.0)
Hemoglobin: 13.3 g/dL (ref 13.0–17.0)
MCH: 28.3 pg (ref 26.0–34.0)
MCHC: 33.9 g/dL (ref 30.0–36.0)
MCV: 83.4 fL (ref 80.0–100.0)
Platelets: 450 10*3/uL — ABNORMAL HIGH (ref 150–400)
RBC: 4.7 MIL/uL (ref 4.22–5.81)
RDW: 13.4 % (ref 11.5–15.5)
WBC: 10.3 10*3/uL (ref 4.0–10.5)
nRBC: 0 % (ref 0.0–0.2)

## 2020-01-09 LAB — BASIC METABOLIC PANEL
Anion gap: 11 (ref 5–15)
BUN: 15 mg/dL (ref 6–20)
CO2: 22 mmol/L (ref 22–32)
Calcium: 9 mg/dL (ref 8.9–10.3)
Chloride: 101 mmol/L (ref 98–111)
Creatinine, Ser: 0.82 mg/dL (ref 0.61–1.24)
GFR, Estimated: >60 mL/min (ref >60)
Glucose, Bld: 93 mg/dL (ref 70–99)
Potassium: 4 mmol/L (ref 3.5–5.1)
Sodium: 134 mmol/L — ABNORMAL LOW (ref 135–145)

## 2020-01-09 NOTE — Plan of Care (Signed)
  Problem: Education: Goal: Knowledge of General Education information will improve Description Including pain rating scale, medication(s)/side effects and non-pharmacologic comfort measures Outcome: Progressing   

## 2020-01-10 DIAGNOSIS — A419 Sepsis, unspecified organism: Secondary | ICD-10-CM | POA: Diagnosis not present

## 2020-01-10 DIAGNOSIS — F17200 Nicotine dependence, unspecified, uncomplicated: Secondary | ICD-10-CM

## 2020-01-10 DIAGNOSIS — M869 Osteomyelitis, unspecified: Secondary | ICD-10-CM | POA: Diagnosis not present

## 2020-01-10 MED ORDER — PSYLLIUM 95 % PO PACK
1.0000 | PACK | Freq: Every day | ORAL | Status: DC
  Administered 2020-01-10: 1 via ORAL
  Filled 2020-01-10 (×2): qty 1

## 2020-01-10 NOTE — Progress Notes (Addendum)
Family Medicine Teaching Service Daily Progress Note Intern Pager: [phone]  Patient name: [name] Medical record number: [id number] Date of birth: 21-May-1980 Age: 39 y.o. Gender: male  Primary Care Provider: Verlon Au, MD Consultants: Orthopedics Code Status: Full  Pt Overview and Major Events to Date:  Admitted on 10/21  Assessment and Plan: [name] a 39 y.o.malepresenting with a purulent wound on the left knee, found to have osteomyelitis on X ray. 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  WBC count normal and inflammatory markers downtrending.  Afebrile and BC NGTD > 48 hours.  Scheduled for excision debridement on 10/27 with Dr. Lajoyce Corners. -Orthopedics on board, appreciate recommendations and debridement next week -ID consulted and signed off, rec to continue ceftriaxone as monotherapy -Cont ceftriaxone IV (10/21-) -Monitor BC, vitals -Tylenol as needed for discomfort -If re-fevers, obtain repeat blood cultures and restart vancomycin (at slow rate to avoid previously experienced infusion syndrome) per ID  Hypertension: Chronic, stable. Systolic BP 120-145 overnight. -Monitor BP -Continue home lisinopril 10 mg, restarted on 10/23  Tobacco use disorder: Chronic, stable. Currently smokes 3 to 4 cigarettes/day for the past 25 years. - Nicotine lozengesPRN  Substance use disorder: Chronic, stable. Smokes marijuana every other day. - Encourage cessation  Microscopic Hematuria: Acute, mild. Clean-catch specimen,6-10 rbc/hpf seen on UA. CR WNL without any associated abdominal pain. 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  Has altered gait and spasticity/weakness in left hand with dysarthria at baseline.    FEN/GI:  Regular diet PPx: Lovenox  Disposition: Hospitalized through debridement on 10/27  Subjective:  Pt was resting in chair and in good spirits.  Denies chest pain, SOB, abdominal pain, vision changes. Admits to one bout of watery diarrhea this morning.  Objective: Temp:  [98.8 F (37.1 C)-100.2 F (37.9 C)] 99.3 F (37.4 C) (10/25 0436) Pulse Rate:  [71-75] 71 (10/25 0436) Resp:  [18-20] 18 (10/25 0436) BP: (135-140)/(87-98) 140/87 (10/25 0436) SpO2:  [99 %-100 %] 100 % (10/25 0436) Physical Exam: General: Alert, good spirits, no acute distress Cardiovascular: RRR, no murmur, normal S1 and S2 Respiratory: CTA bilaterally Abdomen: non tender, non distended Extremities: no edema, warm, dry Skin: Necrotic indurated ulcer on left knee approximately 1 cm in width and 2 cm in height   Laboratory: Recent Labs  Lab 01/07/20 0310 01/08/20 0326 01/09/20 0156  WBC 23.6* 17.2* 10.3  HGB 14.0 12.3* 13.3  HCT 41.7 37.2* 39.2  PLT 442* 434* 450*   Recent Labs  Lab 01/07/20 0310 01/08/20 0326 01/09/20 0156  NA 138 140 134*  K 4.2 4.2 4.0  CL 104 105 101  CO2 24 26 22   BUN 15 21* 15  CREATININE 0.96 0.97 0.82  CALCIUM 9.6 8.9 9.0  PROT  --  6.3*  --   BILITOT  --  0.7  --   ALKPHOS  --  58  --   ALT  --  20  --   AST  --  22  --   GLUCOSE 138* 96 93      Imaging/Diagnostic Tests: DG Chest 1 View  Result Date: 01/06/2020 CLINICAL DATA:  Multifocal osteomyelitis EXAM: CHEST  1 VIEW COMPARISON:  06/28/2013 FINDINGS: The heart size and mediastinal contours are within normal limits. Both lungs are clear. The  visualized skeletal structures are unremarkable. IMPRESSION: No active disease. Electronically Signed   By: Helyn Numbers MD   On: 01/06/2020 13:23   DG Knee 2 Views Left  Result Date: 01/06/2020 CLINICAL DATA:  Osteomyelitis EXAM: LEFT KNEE - 1-2 VIEW COMPARISON:  None. FINDINGS: Two view radiograph left knee demonstrates superficial ulceration and soft tissue  swelling anterior to the anterior tibial tubercle with erosion of the tubercle itself in keeping with subjacent osteomyelitis. There is associated thickening of the patellar tendon, likely related to inflammation or trauma. Hoffa fat is preserved. No effusion. No fracture or dislocation. Medial and lateral joint spaces are preserved. Patellofemoral joint space is not well profiled. IMPRESSION: Superficial wound and associated soft tissue swelling with subjacent osteomyelitis involving the anterior tibial tubercle. Patellar tendon thickening in keeping with inflammation or trauma. Electronically Signed   By: Helyn Numbers MD   On: 01/06/2020 13:23   DG Tibia/Fibula Left  Result Date: 01/06/2020 CLINICAL DATA:  Osteomyelitis EXAM: LEFT TIBIA AND FIBULA - 2 VIEW COMPARISON:  11/16/2018 FINDINGS: There is persistent soft tissue swelling seen anterior to the anterior tibial tubercle within associated soft tissue defect in keeping with a a superficial ulceration. There is progressive, mild erosion involving the anterior tibial tubercle when compared to prior examination in keeping with changes of osteomyelitis. There is mild thickening of the inferior patellar tendon, possibly related to inflammation or trauma. No fracture or dislocation. There is soft tissue swelling superficial to the lateral malleolus with associated small superficial ulceration identified. IMPRESSION: Soft tissue wound, swelling, and subjacent osteomyelitis involving the anterior tibial tubercle. Patellar tendon thickening in keeping with inflammation or trauma Soft tissue swelling superficial to the lateral malleolus without associated osseous erosion. And Electronically Signed   By: Helyn Numbers MD   On: 01/06/2020 13:21   Howard Pouch, Medical Student 01/10/2020, 9:30 AM OMS-IV [facility] FPTS Intern pager: [phone], text pages welcome  Resident Addendum I have separately seen and examined the patient.  I have  discussed the findings and exam with the student and agree with the above note.  I helped develop the management plan that is described in the student's note and I agree with the content.    Lenor Coffin, MD PGY-3 [facility] residency program

## 2020-01-10 NOTE — Plan of Care (Signed)
  Problem: Education: Goal: Knowledge of General Education information will improve Description Including pain rating scale, medication(s)/side effects and non-pharmacologic comfort measures Outcome: Progressing   

## 2020-01-11 ENCOUNTER — Other Ambulatory Visit: Payer: Self-pay | Admitting: Physician Assistant

## 2020-01-11 DIAGNOSIS — M869 Osteomyelitis, unspecified: Secondary | ICD-10-CM | POA: Diagnosis not present

## 2020-01-11 DIAGNOSIS — A419 Sepsis, unspecified organism: Secondary | ICD-10-CM | POA: Diagnosis not present

## 2020-01-11 DIAGNOSIS — F17200 Nicotine dependence, unspecified, uncomplicated: Secondary | ICD-10-CM | POA: Diagnosis not present

## 2020-01-11 LAB — CULTURE, BLOOD (ROUTINE X 2)
Culture: NO GROWTH
Culture: NO GROWTH
Special Requests: ADEQUATE

## 2020-01-11 LAB — CBC
HCT: 45.4 % (ref 39.0–52.0)
Hemoglobin: 15 g/dL (ref 13.0–17.0)
MCH: 27.7 pg (ref 26.0–34.0)
MCHC: 33 g/dL (ref 30.0–36.0)
MCV: 83.9 fL (ref 80.0–100.0)
Platelets: 467 10*3/uL — ABNORMAL HIGH (ref 150–400)
RBC: 5.41 MIL/uL (ref 4.22–5.81)
RDW: 13.4 % (ref 11.5–15.5)
WBC: 7.4 10*3/uL (ref 4.0–10.5)
nRBC: 0 % (ref 0.0–0.2)

## 2020-01-11 LAB — BASIC METABOLIC PANEL
Anion gap: 10 (ref 5–15)
BUN: 11 mg/dL (ref 6–20)
CO2: 24 mmol/L (ref 22–32)
Calcium: 9.3 mg/dL (ref 8.9–10.3)
Chloride: 103 mmol/L (ref 98–111)
Creatinine, Ser: 0.94 mg/dL (ref 0.61–1.24)
GFR, Estimated: >60 mL/min (ref >60)
Glucose, Bld: 100 mg/dL — ABNORMAL HIGH (ref 70–99)
Potassium: 4.7 mmol/L (ref 3.5–5.1)
Sodium: 137 mmol/L (ref 135–145)

## 2020-01-11 LAB — SURGICAL PCR SCREEN
MRSA, PCR: NEGATIVE
Staphylococcus aureus: NEGATIVE

## 2020-01-11 MED ORDER — ENSURE PRE-SURGERY PO LIQD
296.0000 mL | Freq: Once | ORAL | Status: AC
  Administered 2020-01-12: 296 mL via ORAL
  Filled 2020-01-11: qty 296

## 2020-01-11 NOTE — TOC Initial Note (Signed)
Transition of Care [facility]) - Initial/Assessment Note    Patient Details  Name: [name] MRN: [id number] Date of Birth: [birth year]  Transition of Care [facility]) CM/SW Contact:    Kingsley Plan, RN Phone Number: 01/11/2020, 11:29 AM  Clinical Narrative:                 Spoke to patient at bedside. Confirmed face sheet information. Patient from home with girlfriend. Prior to admission patient not needing any DME.   Consult for transportation to [facility]. Discussed with patient. Patient states he has no transportation issues.   Patient for procedure tomorrow 01/12/20 will continue to follow for any discharge needs.   Expected Discharge Plan: Home/Self Care Barriers to Discharge: Continued Medical Work up   Patient Goals and CMS Choice Patient states their goals for this hospitalization and ongoing recovery are:: to return to home CMS Medicare.gov Compare Post Acute Care list provided to:: Patient Choice offered to / list presented to : Patient  Expected Discharge Plan and Services Expected Discharge Plan: Home/Self Care   Discharge Planning Services: CM Consult   Living arrangements for the past 2 months: Apartment                   DME Agency: NA       HH Arranged: NA          Prior Living Arrangements/Services Living arrangements for the past 2 months: Apartment Lives with:: Significant Other Patient language and need for interpreter reviewed:: Yes Do you feel safe going back to the place where you live?: Yes      Need for Family Participation in Patient Care: Yes (Comment) Care giver support system in place?: Yes (comment)   Criminal Activity/Legal Involvement Pertinent to Current Situation/Hospitalization: No - Comment as needed  Activities of Daily Living Home Assistive Devices/Equipment: Cane (specify quad or straight) ADL Screening (condition at time of admission) Patient's cognitive ability adequate to safely complete daily activities?: Yes Is  the patient deaf or have difficulty hearing?: No Does the patient have difficulty seeing, even when wearing glasses/contacts?: No Does the patient have difficulty concentrating, remembering, or making decisions?: No Patient able to express need for assistance with ADLs?: Yes Does the patient have difficulty dressing or bathing?: No Independently performs ADLs?: Yes (appropriate for developmental age) Does the patient have difficulty walking or climbing stairs?: Yes Weakness of Legs: Left Weakness of Arms/Hands: None  Permission Sought/Granted   Permission granted to share information with : No              Emotional Assessment Appearance:: Appears stated age Attitude/Demeanor/Rapport: Engaged Affect (typically observed): Accepting Orientation: : Oriented to Situation, Oriented to  Time, Oriented to Place, Oriented to Self Alcohol / Substance Use: Not Applicable Psych Involvement: No (comment)  Admission diagnosis:  Tobacco use disorder [F17.200] Osteomyelitis (HCC) [M86.9] Sepsis (HCC) [A41.9] Osteomyelitis of left tibia, unspecified type (HCC) [M86.9] Sepsis, due to unspecified organism, unspecified whether acute organ dysfunction present 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, [place] - [address] [facility] [address] [place] [address] Phone: [phone] Fax: [phone]     Social Determinants of Health (SDOH) Interventions    Readmission Risk Interventions No flowsheet data found.

## 2020-01-11 NOTE — Progress Notes (Addendum)
Family Medicine Teaching Service Daily Progress Note Intern Pager: [phone]  Patient name: [name] Medical record number: [id number] Date of birth: Oct 11, 1980 Age: 39 y.o. Gender: male  Primary Care Provider: Verlon Au, MD Consultants: Orthopedics Code Status: Full  Pt Overview and Major Events to Date:  [name] a 39 y.o.malepresenting with a purulent wound on the left knee, found to have osteomyelitis on X ray. PMH is significant forHTN andosteomyelitis of the left knee last year for which he was hospitalized and left AMA  Assessment and Plan:   Osteomyelitisof left tibia tubercle: Subacute on chronic, improving. WBC count normaland inflammatory markers downtrending. Afebrile and BC NGTD >48 hours. Scheduled for excision debridement on 10/27 with Dr. Lajoyce Corners. -Orthopedics on board, appreciate recommendations and debridement tomorrow -ID consulted and signed off, rec to continue ceftriaxone as monotherapy -Contceftriaxone IV(10/21-) - CBC, BMP daily  - Monitor blood cultures: No growth at 5 days.  -Tylenol as needed for discomfort -Ifre-fevers,obtain repeat blood cultures and restart vancomycin(at slow rate to avoidpreviously experienced infusion syndrome)per ID  Hypertension: Chronic, stable. BP 131/79 today -Monitor BP -Continue home lisinopril 10 mg,restarted on 10/23  Tobacco use disorder:Chronic, stable. Currently smokes 3 to 4 cigarettes/day for the past 25 years. - Nicotine lozengesPRN  Substance use disorder: Chronic, stable. Smokes marijuana every other day. -Encourage cessation  Microscopic Hematuria: Acute, mild. Clean-catch specimen,6-10 rbc/hpf seen on UA. CR WNL without any associated abdominal pain. 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  Has altered gait and spasticity/weakness in left hand with dysarthria at  baseline.  ?Diarrhea Pt states he had 12 bouts of semi-watery diarrhea yesterday, two very soft BM this morning and vomited once around 11:30 last night after over eating.  He states he felt better after vomiting and went to sleep. He denies nausea and abdominal pain. RN unaware of diarrhea.  -monitor I/O today     FEN/GI: Regular diet PPx: Lovenox   Disposition: Hospitalized through debridement on 10/27  Subjective:  Pt was resting in chair and in good spirits.  Denies chest pain, SOB, abdominal pain and headache. Admits to 12 bouts of watery diarrhea throughout the day yesterday and two bouts of very soft stools this morning. He also admits to over eating last night and vomiting once around 11:30.  He states he felt better after vomiting and went to bed.     Objective: Temp:  [98.1 F (36.7 C)-98.5 F (36.9 C)] 98.5 F (36.9 C) (10/26 0431) Pulse Rate:  [71-105] 71 (10/26 0431) Resp:  [17-18] 17 (10/26 0431) BP: (124-131)/(79-98) 131/79 (10/26 0431) SpO2:  [91 %-99 %] 91 % (10/26 0431)  Physical Exam: General: Comfortably resting in chair, alert, in no acute distress Cardiovascular: RRR, no murmur, S1 and S2 normal Respiratory: CTA bilaterally Abdomen: non tender, non distended Extremities: no edema, warm and dry Skin: Necrotic indurated ulcer on left knee approximately 1 cm in width and 2 cm in height  Laboratory: Recent Labs  Lab 01/07/20 0310 01/08/20 0326 01/09/20 0156  WBC 23.6* 17.2* 10.3  HGB 14.0 12.3* 13.3  HCT 41.7 37.2* 39.2  PLT 442* 434* 450*   Recent Labs  Lab 01/07/20 0310 01/08/20 0326 01/09/20 0156  NA 138 140 134*  K 4.2 4.2 4.0  CL 104 105 101  CO2 24 26 22   BUN 15 21* 15  CREATININE 0.96 0.97 0.82  CALCIUM 9.6 8.9 9.0  PROT  --  6.3*  --   BILITOT  --  0.7  --   ALKPHOS  --  58  --   ALT  --  20  --   AST  --  22  --   GLUCOSE 138* 96 93     Imaging/Diagnostic Tests: DG Chest 1 View  Result Date: 01/06/2020 CLINICAL DATA:   Multifocal osteomyelitis EXAM: CHEST  1 VIEW COMPARISON:  06/28/2013 FINDINGS: The heart size and mediastinal contours are within normal limits. Both lungs are clear. The visualized skeletal structures are unremarkable. IMPRESSION: No active disease. Electronically Signed   By: Helyn Numbers MD   On: 01/06/2020 13:23   DG Knee 2 Views Left  Result Date: 01/06/2020 CLINICAL DATA:  Osteomyelitis EXAM: LEFT KNEE - 1-2 VIEW COMPARISON:  None. FINDINGS: Two view radiograph left knee demonstrates superficial ulceration and soft tissue swelling anterior to the anterior tibial tubercle with erosion of the tubercle itself in keeping with subjacent osteomyelitis. There is associated thickening of the patellar tendon, likely related to inflammation or trauma. Hoffa fat is preserved. No effusion. No fracture or dislocation. Medial and lateral joint spaces are preserved. Patellofemoral joint space is not well profiled. IMPRESSION: Superficial wound and associated soft tissue swelling with subjacent osteomyelitis involving the anterior tibial tubercle. Patellar tendon thickening in keeping with inflammation or trauma. Electronically Signed   By: Helyn Numbers MD   On: 01/06/2020 13:23   DG Tibia/Fibula Left  Result Date: 01/06/2020 CLINICAL DATA:  Osteomyelitis EXAM: LEFT TIBIA AND FIBULA - 2 VIEW COMPARISON:  11/16/2018 FINDINGS: There is persistent soft tissue swelling seen anterior to the anterior tibial tubercle within associated soft tissue defect in keeping with a a superficial ulceration. There is progressive, mild erosion involving the anterior tibial tubercle when compared to prior examination in keeping with changes of osteomyelitis. There is mild thickening of the inferior patellar tendon, possibly related to inflammation or trauma. No fracture or dislocation. There is soft tissue swelling superficial to the lateral malleolus with associated small superficial ulceration identified. IMPRESSION: Soft tissue  wound, swelling, and subjacent osteomyelitis involving the anterior tibial tubercle. Patellar tendon thickening in keeping with inflammation or trauma Soft tissue swelling superficial to the lateral malleolus without associated osseous erosion. And Electronically Signed   By: Helyn Numbers MD   On: 01/06/2020 13:21   Howard Pouch, Medical Student 01/11/2020, 8:12 AM OMS-IV [facility] FPTS Intern pager: [phone], text pages welcome     RESIDENT ATTESTATION OF STUDENT NOTE   I have seen and examined this patient.    I have discussed the findings and exam with the medical student and agree with the above note, which I have edited appropriately. I helped develop the management plan that is described in the student's note, and I agree with the content.   Katha Cabal, DO PGY-1, [facility] 01/11/2020 1:20 PM

## 2020-01-12 ENCOUNTER — Encounter (HOSPITAL_COMMUNITY)
Admission: EM | Disposition: A | Discharge status: Home / Self Care | Payer: Self-pay | Source: Home / Self Care | Attending: Family Medicine

## 2020-01-12 ENCOUNTER — Inpatient Hospital Stay (HOSPITAL_COMMUNITY): Payer: Medicaid Other

## 2020-01-12 ENCOUNTER — Encounter (HOSPITAL_COMMUNITY): Payer: Self-pay | Admitting: Family Medicine

## 2020-01-12 DIAGNOSIS — L97924 Non-pressure chronic ulcer of unspecified part of left lower leg with necrosis of bone: Secondary | ICD-10-CM | POA: Diagnosis not present

## 2020-01-12 DIAGNOSIS — F17200 Nicotine dependence, unspecified, uncomplicated: Secondary | ICD-10-CM | POA: Diagnosis not present

## 2020-01-12 DIAGNOSIS — M869 Osteomyelitis, unspecified: Secondary | ICD-10-CM | POA: Diagnosis not present

## 2020-01-12 HISTORY — PX: I & D EXTREMITY: SHX5045

## 2020-01-12 SURGERY — IRRIGATION AND DEBRIDEMENT EXTREMITY
Anesthesia: General | Site: Leg Lower | Laterality: Left

## 2020-01-12 MED ORDER — SODIUM CHLORIDE 0.9 % IV SOLN: INTRAVENOUS | Status: DC

## 2020-01-12 MED ORDER — ROPIVACAINE HCL 7.5 MG/ML IJ SOLN
INTRAMUSCULAR | Status: DC | PRN
  Administered 2020-01-12: 20 mL via PERINEURAL

## 2020-01-12 MED ORDER — PROPOFOL 10 MG/ML IV BOLUS
INTRAVENOUS | Status: DC | PRN
  Administered 2020-01-12: 150 mg via INTRAVENOUS

## 2020-01-12 MED ORDER — ONDANSETRON HCL 4 MG/2ML IJ SOLN: 4.0000 mg | Freq: Four times a day (QID) | INTRAMUSCULAR | Status: DC | PRN

## 2020-01-12 MED ORDER — ONDANSETRON HCL 4 MG PO TABS: 4.0000 mg | ORAL_TABLET | Freq: Four times a day (QID) | ORAL | Status: DC | PRN

## 2020-01-12 MED ORDER — HYDROMORPHONE HCL 1 MG/ML IJ SOLN: 0.5000 mg | INTRAMUSCULAR | Status: DC | PRN

## 2020-01-12 MED ORDER — PROPOFOL 10 MG/ML IV BOLUS
INTRAVENOUS | Status: AC
  Filled 2020-01-12: qty 20

## 2020-01-12 MED ORDER — VANCOMYCIN HCL 500 MG IV SOLR
INTRAVENOUS | Status: AC
  Filled 2020-01-12: qty 500

## 2020-01-12 MED ORDER — FENTANYL CITRATE (PF) 100 MCG/2ML IJ SOLN
INTRAMUSCULAR | Status: DC | PRN
  Administered 2020-01-12: 50 ug via INTRAVENOUS

## 2020-01-12 MED ORDER — LACTATED RINGERS IV SOLN: INTRAVENOUS | Status: DC

## 2020-01-12 MED ORDER — FENTANYL CITRATE (PF) 250 MCG/5ML IJ SOLN
INTRAMUSCULAR | Status: AC
  Filled 2020-01-12: qty 5

## 2020-01-12 MED ORDER — 0.9 % SODIUM CHLORIDE (POUR BTL) OPTIME
TOPICAL | Status: DC | PRN
  Administered 2020-01-12: 1000 mL

## 2020-01-12 MED ORDER — PROPOFOL 500 MG/50ML IV EMUL
INTRAVENOUS | Status: DC | PRN
  Administered 2020-01-12: 75 ug/kg/min via INTRAVENOUS

## 2020-01-12 MED ORDER — LIDOCAINE 2% (20 MG/ML) 5 ML SYRINGE
INTRAMUSCULAR | Status: DC | PRN
  Administered 2020-01-12: 40 mg via INTRAVENOUS

## 2020-01-12 MED ORDER — CHLORHEXIDINE GLUCONATE 0.12 % MT SOLN: 15.0000 mL | Freq: Once | OROMUCOSAL | Status: AC

## 2020-01-12 MED ORDER — CHLORHEXIDINE GLUCONATE 0.12 % MT SOLN
OROMUCOSAL | Status: AC
  Administered 2020-01-12: 15 mL via OROMUCOSAL
  Filled 2020-01-12: qty 15

## 2020-01-12 MED ORDER — PHENYLEPHRINE 40 MCG/ML (10ML) SYRINGE FOR IV PUSH (FOR BLOOD PRESSURE SUPPORT)
PREFILLED_SYRINGE | INTRAVENOUS | Status: AC
  Filled 2020-01-12: qty 10

## 2020-01-12 MED ORDER — DOCUSATE SODIUM 100 MG PO CAPS
100.0000 mg | ORAL_CAPSULE | Freq: Two times a day (BID) | ORAL | Status: DC
  Filled 2020-01-12 (×3): qty 1

## 2020-01-12 MED ORDER — HYDROMORPHONE HCL 1 MG/ML IJ SOLN: 0.2500 mg | INTRAMUSCULAR | Status: DC | PRN

## 2020-01-12 MED ORDER — METOCLOPRAMIDE HCL 5 MG PO TABS: 5.0000 mg | ORAL_TABLET | Freq: Three times a day (TID) | ORAL | Status: DC | PRN

## 2020-01-12 MED ORDER — DEXAMETHASONE SODIUM PHOSPHATE 10 MG/ML IJ SOLN
INTRAMUSCULAR | Status: AC
  Filled 2020-01-12: qty 1

## 2020-01-12 MED ORDER — GENTAMICIN SULFATE 40 MG/ML IJ SOLN
INTRAMUSCULAR | Status: DC | PRN
  Administered 2020-01-12: 160 mg via INTRAMUSCULAR

## 2020-01-12 MED ORDER — ONDANSETRON HCL 4 MG/2ML IJ SOLN
INTRAMUSCULAR | Status: AC
  Filled 2020-01-12: qty 2

## 2020-01-12 MED ORDER — MEPERIDINE HCL 25 MG/ML IJ SOLN: 6.2500 mg | INTRAMUSCULAR | Status: DC | PRN

## 2020-01-12 MED ORDER — ONDANSETRON HCL 4 MG/2ML IJ SOLN
INTRAMUSCULAR | Status: DC | PRN
  Administered 2020-01-12: 4 mg via INTRAVENOUS

## 2020-01-12 MED ORDER — MIDAZOLAM HCL 5 MG/5ML IJ SOLN
INTRAMUSCULAR | Status: DC | PRN
  Administered 2020-01-12 (×2): 1 mg via INTRAVENOUS

## 2020-01-12 MED ORDER — METOCLOPRAMIDE HCL 5 MG/ML IJ SOLN: 5.0000 mg | Freq: Three times a day (TID) | INTRAMUSCULAR | Status: DC | PRN

## 2020-01-12 MED ORDER — CLINDAMYCIN PHOSPHATE 900 MG/50ML IV SOLN
900.0000 mg | INTRAVENOUS | Status: DC
  Filled 2020-01-12: qty 50

## 2020-01-12 MED ORDER — ORAL CARE MOUTH RINSE: 15.0000 mL | Freq: Once | OROMUCOSAL | Status: AC

## 2020-01-12 MED ORDER — DEXAMETHASONE SODIUM PHOSPHATE 10 MG/ML IJ SOLN
INTRAMUSCULAR | Status: DC | PRN
  Administered 2020-01-12: 4 mg via INTRAVENOUS

## 2020-01-12 MED ORDER — OXYCODONE HCL 5 MG PO TABS: 5.0000 mg | ORAL_TABLET | ORAL | Status: DC | PRN

## 2020-01-12 MED ORDER — MIDAZOLAM HCL 2 MG/2ML IJ SOLN
INTRAMUSCULAR | Status: AC
  Filled 2020-01-12: qty 2

## 2020-01-12 MED ORDER — GENTAMICIN SULFATE 40 MG/ML IJ SOLN
INTRAMUSCULAR | Status: AC
  Filled 2020-01-12: qty 4

## 2020-01-12 MED ORDER — PROMETHAZINE HCL 25 MG/ML IJ SOLN: 6.2500 mg | INTRAMUSCULAR | Status: DC | PRN

## 2020-01-12 MED ORDER — VANCOMYCIN HCL 500 MG IV SOLR
INTRAVENOUS | Status: DC | PRN
  Administered 2020-01-12: 500 mg

## 2020-01-12 MED ORDER — ENOXAPARIN SODIUM 40 MG/0.4ML ~~LOC~~ SOLN
40.0000 mg | SUBCUTANEOUS | Status: DC
  Filled 2020-01-12 (×2): qty 0.4

## 2020-01-12 SURGICAL SUPPLY — 34 items
BLADE SURG 21 STRL SS (BLADE) ×3 IMPLANT
BNDG COHESIVE 6X5 TAN STRL LF (GAUZE/BANDAGES/DRESSINGS) IMPLANT
BNDG GAUZE ELAST 4 BULKY (GAUZE/BANDAGES/DRESSINGS) ×4 IMPLANT
COVER SURGICAL LIGHT HANDLE (MISCELLANEOUS) ×4 IMPLANT
COVER WAND RF STERILE (DRAPES) IMPLANT
DRAPE U-SHAPE 47X51 STRL (DRAPES) ×3 IMPLANT
DRSG ADAPTIC 3X8 NADH LF (GAUZE/BANDAGES/DRESSINGS) ×3 IMPLANT
DURAPREP 26ML APPLICATOR (WOUND CARE) ×3 IMPLANT
ELECT REM PT RETURN 9FT ADLT (ELECTROSURGICAL)
ELECTRODE REM PT RTRN 9FT ADLT (ELECTROSURGICAL) IMPLANT
GAUZE SPONGE 4X4 12PLY STRL (GAUZE/BANDAGES/DRESSINGS) ×3 IMPLANT
GLOVE BIOGEL PI IND STRL 9 (GLOVE) ×1 IMPLANT
GLOVE BIOGEL PI INDICATOR 9 (GLOVE) ×2
GLOVE SURG ORTHO 9.0 STRL STRW (GLOVE) ×3 IMPLANT
GOWN STRL REUS W/ TWL XL LVL3 (GOWN DISPOSABLE) ×2 IMPLANT
GOWN STRL REUS W/TWL XL LVL3 (GOWN DISPOSABLE) ×6
HANDPIECE INTERPULSE COAX TIP (DISPOSABLE)
KIT BASIN OR (CUSTOM PROCEDURE TRAY) ×3 IMPLANT
KIT PUMP PREVENA PLUS 14DAY (MISCELLANEOUS) ×2 IMPLANT
KIT STIMULAN RAPID CURE 5CC (Orthopedic Implant) ×2 IMPLANT
KIT TURNOVER KIT B (KITS) ×3 IMPLANT
MANIFOLD NEPTUNE II (INSTRUMENTS) ×3 IMPLANT
NS IRRIG 1000ML POUR BTL (IV SOLUTION) ×3 IMPLANT
PACK ORTHO EXTREMITY (CUSTOM PROCEDURE TRAY) ×3 IMPLANT
PAD ARMBOARD 7.5X6 YLW CONV (MISCELLANEOUS) ×4 IMPLANT
SET HNDPC FAN SPRY TIP SCT (DISPOSABLE) IMPLANT
STOCKINETTE IMPERVIOUS 9X36 MD (GAUZE/BANDAGES/DRESSINGS) IMPLANT
SUT ETHILON 2 0 PSLX (SUTURE) ×3 IMPLANT
SWAB COLLECTION DEVICE MRSA (MISCELLANEOUS) ×3 IMPLANT
SWAB CULTURE ESWAB REG 1ML (MISCELLANEOUS) IMPLANT
TOWEL GREEN STERILE (TOWEL DISPOSABLE) ×3 IMPLANT
TUBE CONNECTING 12'X1/4 (SUCTIONS) ×1
TUBE CONNECTING 12X1/4 (SUCTIONS) ×2 IMPLANT
YANKAUER SUCT BULB TIP NO VENT (SUCTIONS) ×3 IMPLANT

## 2020-01-12 NOTE — Anesthesia Procedure Notes (Signed)
Procedure Name: MAC Date/Time: 01/12/2020 9:52 AM Performed by: Inda Coke, CRNA Pre-anesthesia Checklist: Patient identified, Emergency Drugs available, Suction available, Timeout performed and Patient being monitored Patient Re-evaluated:Patient Re-evaluated prior to induction Oxygen Delivery Method: Simple face mask Induction Type: IV induction Dental Injury: Teeth and Oropharynx as per pre-operative assessment

## 2020-01-12 NOTE — Anesthesia Procedure Notes (Signed)
Anesthesia Regional Block: Adductor canal block   Pre-Anesthetic Checklist: ,, timeout performed, Correct Patient, Correct Site, Correct Laterality, Correct Procedure, Correct Position, site marked, Risks and benefits discussed,  Surgical consent,  Pre-op evaluation,  At surgeon's request and post-op pain management  Laterality: Left  Prep: chloraprep       Needles:  Injection technique: Single-shot  Needle Type: Stimiplex     Needle Length: 9cm  Needle Gauge: 21     Additional Needles:   Procedures:,,,, ultrasound used (permanent image in chart),,,,  Narrative:  Start time: 01/12/2020 9:28 AM End time: 01/12/2020 9:33 AM Injection made incrementally with aspirations every 5 mL.  Performed by: Personally  Anesthesiologist: Lewie Loron, MD  Additional Notes: BP cuff, EKG monitors applied. Sedation begun. Artery and nerve location verified with U/S and anesthetic injected incrementally, slowly, and after negative aspirations under direct u/s guidance. Good fascial /perineural spread. Tolerated well.

## 2020-01-12 NOTE — Anesthesia Preprocedure Evaluation (Addendum)
Anesthesia Evaluation  Patient identified by MRN, date of birth, ID band Patient awake    Reviewed: Allergy & Precautions, NPO status , Patient's Chart, lab work & pertinent test results  Airway Mallampati: II  TM Distance: >3 FB Neck ROM: Full    Dental  (+) Edentulous Upper, Poor Dentition, Missing, Dental Advisory Given   Pulmonary pneumonia, Current Smoker and Patient abstained from smoking.,    Pulmonary exam normal breath sounds clear to auscultation       Cardiovascular hypertension, Pt. on medications Normal cardiovascular exam Rhythm:Regular Rate:Normal     Neuro/Psych  Headaches, PSYCHIATRIC DISORDERS Depression CVA    GI/Hepatic negative GI ROS, (+)     substance abuse  alcohol use,   Endo/Other  negative endocrine ROS  Renal/GU negative Renal ROS     Musculoskeletal negative musculoskeletal ROS (+)   Abdominal   Peds  Hematology negative hematology ROS (+)   Anesthesia Other Findings   Reproductive/Obstetrics                            Anesthesia Physical Anesthesia Plan  ASA: III  Anesthesia Plan: General   Post-op Pain Management: GA combined w/ Regional for post-op pain   Induction: Intravenous  PONV Risk Score and Plan: 0 and Midazolam, Treatment may vary due to age or medical condition, Ondansetron and Dexamethasone  Airway Management Planned: LMA  Additional Equipment: None  Intra-op Plan:   Post-operative Plan: Extubation in OR  Informed Consent: I have reviewed the patients History and Physical, chart, labs and discussed the procedure including the risks, benefits and alternatives for the proposed anesthesia with the patient or authorized representative who has indicated his/her understanding and acceptance.     Dental advisory given  Plan Discussed with: CRNA  Anesthesia Plan Comments:       Anesthesia Quick Evaluation

## 2020-01-12 NOTE — Hospital Course (Addendum)
Admitted on 10/21 and presented with a purulent wound on the left knee, found to have osteomyelitis. PMH is significant for HTN and osteomyelitis of the left knee last year for which he was hospitalized and left AMA  Osteomyelitis of left tibia tubercle Per ID, IV CTX was continued until discharge. Blood cultures from 10/21 showed NGTD.  Pt had excisional debridement of his left tibial tubercle on 01/12/20. The wound was packed with vancomycin and gentamycin and a wound VAC was placed. Per ID, pt will be discharged on doxycycline 100 mg bid and Keflex 500 mg qid for 4 weeks through 11/23.  HTN Pt takes lisinopril at home for HTN. He was given lisinopril 10 mg QD during his stay.

## 2020-01-12 NOTE — Transfer of Care (Signed)
Immediate Anesthesia Transfer of Care Note  Patient: [name]  Procedure(s) Performed: EXCISION DEBRIDEMENT LEFT TIBIAL TUBERCLE, PLACE ANTIBIOTIC BEADS (Left Leg Lower)  Patient Location: PACU  Anesthesia Type:GA combined with regional for post-op pain  Level of Consciousness: awake and drowsy  Airway & Oxygen Therapy: Patient Spontanous Breathing and Patient connected to nasal cannula oxygen  Post-op Assessment: Report given to RN and Post -op Vital signs reviewed and stable  Post vital signs: Reviewed and stable  Last Vitals:  Vitals Value Taken Time  BP    Temp    Pulse    Resp    SpO2      Last Pain:  Vitals:   01/12/20 0359  TempSrc: Oral  PainSc:       Patients Stated Pain Goal: 3 (01/06/20 1928)  Complications: No complications documented.

## 2020-01-12 NOTE — Op Note (Addendum)
01/12/2020  10:28 AM  PATIENT:  [name]    PRE-OPERATIVE DIAGNOSIS:  Abscess Osteomyelitis Left Tibia  POST-OPERATIVE DIAGNOSIS:  Same  PROCEDURE:  EXCISION DEBRIDEMENT LEFT TIBIAL TUBERCLE, PLACE ANTIBIOTIC BEADS with 500 mg vancomycin and 160 mg gentamicin Excision of skin and soft tissue fascia and bone using osteotome and curette. Local tissue rearrangement for wound closure 7 x 4 cm. Placement of Prevena wound VAC 13cm the  SURGEON:  Nadara Mustard, MD  PHYSICIAN ASSISTANT:None ANESTHESIA:   General  PREOPERATIVE INDICATIONS:  [name] is a  39 y.o. male with a diagnosis of Abscess Osteomyelitis Left Tibia who failed conservative measures and elected for surgical management.    The risks benefits and alternatives were discussed with the patient preoperatively including but not limited to the risks of infection, bleeding, nerve injury, cardiopulmonary complications, the need for revision surgery, among others, and the patient was willing to proceed.  OPERATIVE IMPLANTS: Antibiotic beads stimulant with 500 mg vancomycin and 160 mg gentamicin  @ENCIMAGES @  OPERATIVE FINDINGS: Ulcer over the tibial tubercle with necrotic bone skin and soft tissue.  Tissue sent for cultures.  OPERATIVE PROCEDURE: Patient was brought the operating room and underwent a general anesthetic.  After adequate levels anesthesia were obtained patient's left lower extremity was prepped using DuraPrep draped into a sterile field a timeout was called.  Elliptical incision was made around the ulcerative tissue this left a wound that was 7 x 4 cm.  This was carried sharply down to bone and the skin and soft tissue was resected in 1 block of tissue.  A osteotome was then fused to debride the necrotic bone cortex.  A curette was then used to debride the bone within the medullary canal.  All bone metaphyseal and cortical was sent for cultures.  Antibiotic beads were then placed 5 cc within the metaphysis.   This included 500 mg of vancomycin and 160 mg of gentamicin.  Local tissue rearrangement was used to close the wound 7 x 4 cm.  A 13 cm Prevena VAC was applied this had a good suction fit patient was extubated taken the PACU in stable condition   DISCHARGE PLANNING:  Antibiotic duration: Continue antibiotics and patient will need long-term antibiotics based on tissue cultures  Weightbearing: Weightbearing as tolerated on the left  Pain medication: Opioid pathway  Dressing care/ Wound VAC: Continue wound VAC for 1 week  Ambulatory devices: Crutches or walker  Discharge to: Anticipate discharge to home once sensitivities are finalized.  Follow-up: In the office 1 week post operative.

## 2020-01-12 NOTE — Progress Notes (Addendum)
FPTS Interim Progress Note  S:Pt was lying comfortably in bed post excisional debridement of left tibial tubercle. He reports he feels well, is hungry, is in no pain, and would like to go home as soon as possible.  O: BP 125/78 (BP Location: Left Arm)   Pulse 90   Temp 97.9 F (36.6 C) (Oral)   Resp 14   Ht 6\' 1"  (1.854 m)   Wt 89.4 kg   SpO2 100%   BMI 26.00 kg/m   General: alert, in no acute distress Cardio: RRR, no murmurs, normal S1 and S2 Lungs: CTA bilaterally  MSK: wearing wound vac on LLE s/p debridement, there is serosanguinous drainage in tube  A/P:  Post Excisional Debridement due to Osteomyelitis  Pt feels well and states his god sister who lives with him in a second floor apartment will be able to help take care of him when he goes home. -waiting on wound cultures -continue IV Ceftriaxone -PT/OT -follow ortho recs , Medical Student 01/12/2020, 1:57 PM OMS-IV AI, [facility] pager [phone]  [phone], DO PGY-2, [facility] 01/12/2020 2:17 PM

## 2020-01-12 NOTE — Interval H&P Note (Signed)
History and Physical Interval Note:  01/12/2020 6:57 AM  [name]  has presented today for surgery, with the diagnosis of Abscess Osteomyelitis Left Tibia.  The various methods of treatment have been discussed with the patient and family. After consideration of risks, benefits and other options for treatment, the patient has consented to  Procedure(s): EXCISION DEBRIDEMENT LEFT TIBIAL TUBERCLE, PLACE ANTIBIOTIC BEADS (Left) as a surgical intervention.  The patient's history has been reviewed, patient examined, no change in status, stable for surgery.  I have reviewed the patient's chart and labs.  Questions were answered to the patient's satisfaction.     Nadara Mustard

## 2020-01-12 NOTE — Progress Notes (Signed)
Family Medicine Teaching Service Daily Progress Note Intern Pager: [phone]  Patient name: [name] Medical record number: [id number] Date of birth: 01-May-1980 Age: 39 y.o. Gender: male  Primary Care Provider: Verlon Au, MD Consultants: orthopedics Code Status: Full  Pt Overview and Major Events to Date:  Admitted on 10/21 Surgical debridement of left knee d/t osteomyelitis scheduled for 10/27 Assessment and Plan:  [name] a 39 y.o.malepresenting with a purulent wound on the left knee, found to have osteomyelitis on X ray. 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 WBC count normaland inflammatory markers downtrending. Afebrile and BC from 10/21 show NGTD. Scheduled for excision debridement today with Dr. Lajoyce Corners. -Orthopedics on board, appreciate recommendations and debridement today -ID consulted and signed off, rec to continue ceftriaxone as monotherapy -Contceftriaxone IV(10/21-) -Monitor BC, vitals -Tylenol as needed for discomfort -Ifre-fevers,obtain repeat blood cultures and restart vancomycin(at slow rate to avoidpreviously experienced infusion syndrome)per ID  Hypertension: Chronic, stable. BP today is 124/85 -Monitor BP -Continue home lisinopril 10 mg,restarted on 10/23  Tobacco use disorder:Chronic, stable. Currently smokes 3 to 4 cigarettes/day for the past 25 years. - Nicotine lozengesPRN  Substance use disorder: Chronic, stable. Smokes marijuana every other day. -Encourage cessation  Microscopic Hematuria: Acute, mild. Clean-catch specimen,6-10 rbc/hpf seen on UA. CR WNL without any associated abdominal pain. 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  Has altered gait and  spasticity/weakness in left hand with dysarthria at baseline.     FEN/GI: NPO for surgery today PPx: Lovenox  Disposition: Med-Surg until surgical debridement this morning  Subjective:  Pt is resting comfortably in chair this morning.  He is in good spirits and denies chest pain, headache, SOB, abdominal pain.  He denies any more diarrhea or loose stools since yesterday morning.  He is ready to have his surgical debridement of his left tibial tubercle this morning.   Objective: Temp:  [97.7 F (36.5 C)-97.8 F (36.6 C)] 97.8 F (36.6 C) (10/27 0359) Pulse Rate:  [65-88] 65 (10/27 0359) Resp:  [18] 18 (10/26 2101) BP: (123-128)/(85-91) 124/85 (10/27 0359) SpO2:  [96 %-100 %] 100 % (10/27 0359) Physical Exam: General: comfortably resting in chair, alert, in no acute distress Cardiovascular: RRR, no murmur, S1 and S2 normal Respiratory: CTA bilaterally Abdomen: non tender, non distended Extremities: no edema, warm and dry Skin: Necrotic indurated ulcer on left knee approximately 1 cm in width and 2 cm in height  Laboratory: Recent Labs  Lab 01/08/20 0326 01/09/20 0156 01/11/20 0853  WBC 17.2* 10.3 7.4  HGB 12.3* 13.3 15.0  HCT 37.2* 39.2 45.4  PLT 434* 450* 467*   Recent Labs  Lab 01/08/20 0326 01/09/20 0156 01/11/20 0853  NA 140 134* 137  K 4.2 4.0 4.7  CL 105 101 103  CO2 26 22 24   BUN 21* 15 11  CREATININE 0.97 0.82 0.94  CALCIUM 8.9 9.0 9.3  PROT 6.3*  --   --   BILITOT 0.7  --   --   ALKPHOS 58  --   --   ALT 20  --   --   AST 22  --   --   GLUCOSE 96 93 100*    Imaging/Diagnostic Tests: DG Chest 1 View  Result Date: 01/06/2020 CLINICAL DATA:  Multifocal osteomyelitis EXAM: CHEST  1 VIEW COMPARISON:  06/28/2013 FINDINGS: The heart size and mediastinal contours are within normal limits. Both lungs are clear. The visualized skeletal structures are unremarkable. IMPRESSION: No active disease. Electronically Signed   By: Helyn Numbers MD   On:  01/06/2020 13:23   DG Knee 2 Views Left  Result Date: 01/06/2020 CLINICAL DATA:  Osteomyelitis EXAM: LEFT KNEE - 1-2 VIEW COMPARISON:  None. FINDINGS: Two view radiograph left knee demonstrates superficial ulceration and soft tissue swelling anterior to the anterior tibial tubercle with erosion of the tubercle itself in keeping with subjacent osteomyelitis. There is associated thickening of the patellar tendon, likely related to inflammation or trauma. Hoffa fat is preserved. No effusion. No fracture or dislocation. Medial and lateral joint spaces are preserved. Patellofemoral joint space is not well profiled. IMPRESSION: Superficial wound and associated soft tissue swelling with subjacent osteomyelitis involving the anterior tibial tubercle. Patellar tendon thickening in keeping with inflammation or trauma. Electronically Signed   By: Helyn Numbers MD   On: 01/06/2020 13:23   DG Tibia/Fibula Left  Result Date: 01/06/2020 CLINICAL DATA:  Osteomyelitis EXAM: LEFT TIBIA AND FIBULA - 2 VIEW COMPARISON:  11/16/2018 FINDINGS: There is persistent soft tissue swelling seen anterior to the anterior tibial tubercle within associated soft tissue defect in keeping with a a superficial ulceration. There is progressive, mild erosion involving the anterior tibial tubercle when compared to prior examination in keeping with changes of osteomyelitis. There is mild thickening of the inferior patellar tendon, possibly related to inflammation or trauma. No fracture or dislocation. There is soft tissue swelling superficial to the lateral malleolus with associated small superficial ulceration identified. IMPRESSION: Soft tissue wound, swelling, and subjacent osteomyelitis involving the anterior tibial tubercle. Patellar tendon thickening in keeping with inflammation or trauma Soft tissue swelling superficial to the lateral malleolus without associated osseous erosion. And Electronically Signed   By: Helyn Numbers MD   On:  01/06/2020 13:21    Howard Pouch, Medical Student 01/12/2020, 8:08 AM OMS-IV [facility] FPTS Intern pager: [phone], text pages welcome

## 2020-01-13 ENCOUNTER — Encounter (HOSPITAL_COMMUNITY): Payer: Self-pay | Admitting: Orthopedic Surgery

## 2020-01-13 MED ORDER — DOXYCYCLINE HYCLATE 100 MG PO TABS
100.0000 mg | ORAL_TABLET | Freq: Two times a day (BID) | ORAL | Status: DC
  Administered 2020-01-13 – 2020-01-14 (×3): 100 mg via ORAL
  Filled 2020-01-13 (×3): qty 1

## 2020-01-13 NOTE — Progress Notes (Signed)
Patient ID: [name], male   DOB: 17-Oct-1980, 39 y.o.   MRN: [id number] Patient is postoperative day 1 debridement of abscess ulceration osteomyelitis left tibial tubercle.  Patient states he is feels better there is about 50 cc in the portable wound vacs it is functioning well.  Would discharge patient on oral antibiotics once cultures are finalized.  The wound is packed with vancomycin and gentamicin to give a very high level of local antibiotics.  Patient will discharge with the portable Praveena wound VAC.

## 2020-01-13 NOTE — Evaluation (Signed)
Physical Therapy Evaluation & Discharge Patient Details Name: [name] MRN: [id number] DOB: [birth year] Today's Date: 01/13/2020   History of Present Illness  Pt is a 39 y.o. male admitted 01/06/20 with chronic ulcer L tibial tubercle (s/p I&D to this site 6 years prior at [facility]). S/p L tibial tubercle I&D with antibiotic bead placement 10/27. 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   Clinical Impression  Patient evaluated by Physical Therapy with no further acute PT needs identified. PTA, pt mod indep with intermittent use of SPC due to baseline spasticity. Today, pt mod indep with transfers, ambulation and stairs. Educ re: precautions, positioning, portable wound vac, activity recommendations, importance of mobility. All education has been completed and the patient has no further questions. Acute PT is signing off. Thank you for this referral.    Follow Up Recommendations No PT follow up    Equipment Recommendations  None recommended by PT    Recommendations for Other Services       Precautions / Restrictions Precautions Precautions: Fall Precaution Comments: baseline L spasticity; LLE portable wound vac Restrictions Weight Bearing Restrictions: Yes LLE Weight Bearing: Weight bearing as tolerated      Mobility  Bed Mobility                    Transfers Overall transfer level: Modified independent   Transfers: Sit to/from Stand Sit to Stand: Modified independent (Device/Increase time)            Ambulation/Gait Ambulation/Gait assistance: Modified independent (Device/Increase time) Gait Distance (Feet): 500 Feet Assistive device: Straight cane Gait Pattern/deviations: Step-through pattern;Decreased stride length   Gait velocity interpretation: 1.31 - 2.62 ft/sec, indicative of limited community ambulator General Gait  Details: Spastic gait pattern mod indep with SPC, pt able to hold cane in both hands as needed, but typically using in RUE to assist LLE  Stairs Stairs: Yes Stairs assistance: Modified independent (Device/Increase time) Stair Management: One rail Right;Step to pattern;Forwards Number of Stairs: 11 General stair comments: Mod indep with rail support; good technique to support LLE WBAT without cues  Wheelchair Mobility    Modified Rankin (Stroke Patients Only)       Balance Overall balance assessment: Mild deficits observed, not formally tested                                           Pertinent Vitals/Pain Pain Assessment: No/denies pain    Home Living Family/patient expects to be discharged to:: Private residence Living Arrangements: Other relatives (god-sister) Available Help at Discharge: Family;Available PRN/intermittently Type of Home: Apartment Home Access: Stairs to enter Entrance Stairs-Rails: Doctor, general practice of Steps: 15 Home Layout: One level Home Equipment: Cane - single point (collapsible SPC)      Prior Function Level of Independence: Independent               Hand Dominance        Extremity/Trunk Assessment   Upper Extremity Assessment Upper Extremity Assessment: Overall WFL for tasks assessed;LUE deficits/detail LUE Deficits / Details: baseline spasticity LUE Coordination: decreased gross motor;decreased fine motor    Lower Extremity Assessment Lower Extremity Assessment: LLE deficits/detail LLE Deficits / Details: s/p L tibial I&D; baseline spasticity LLE Coordination: decreased gross motor;decreased fine motor  Communication   Communication: Other (comment) (baseline dysarthria)  Cognition Arousal/Alertness: Awake/alert Behavior During Therapy: WFL for tasks assessed/performed Overall Cognitive Status: History of cognitive impairments - at baseline                                  General Comments: increased time and cues to problem solve higher level tasks, suspect this is baseline      General Comments General comments (skin integrity, edema, etc.): Educ on portable wound vac use (charger, lanyard)    Exercises Other Exercises Other Exercises: AROM L knee flex/ext (limited)   Assessment/Plan    PT Assessment Patent does not need any further PT services  PT Problem List         PT Treatment Interventions      PT Goals (Current goals can be found in the Care Plan section)  Acute Rehab PT Goals Patient Stated Goal: return home PT Goal Formulation: All assessment and education complete, DC therapy    Frequency     Barriers to discharge        Co-evaluation               AM-PAC PT "6 Clicks" Mobility  Outcome Measure Help needed turning from your back to your side while in a flat bed without using bedrails?: None Help needed moving from lying on your back to sitting on the side of a flat bed without using bedrails?: None Help needed moving to and from a bed to a chair (including a wheelchair)?: None Help needed standing up from a chair using your arms (e.g., wheelchair or bedside chair)?: None Help needed to walk in hospital room?: None Help needed climbing 3-5 steps with a railing? : None 6 Click Score: 24    End of Session Equipment Utilized During Treatment: Gait belt Activity Tolerance: Patient tolerated treatment well Patient left: in chair;with call bell/phone within reach Nurse Communication: Mobility status PT Visit Diagnosis: Other abnormalities of gait and mobility (R26.89)    Time: 2542-7062 PT Time Calculation (min) (ACUTE ONLY): 20 min   Charges:   PT Evaluation $PT Eval Low Complexity: 1 Low     Ina Homes, PT, DPT Acute Rehabilitation Services  Pager [phone] Office [phone]  Malachy Chamber 01/13/2020, 5:47 PM

## 2020-01-13 NOTE — Discharge Summary (Signed)
[facility] Discharge Summary  Patient name: [name] Medical record number: [id number] Date of birth: [birth year] Age: 39 y.o. Gender: male Date of Admission: 01/06/2020  Date of Discharge: 01/14/2020 Admitting Physician: Benay Pike, MD  Primary Care Provider: Bartholome Bill, MD Consultants: Orthopedics and infectious disesase  Indication for Hospitalization: Sepsis and Osteomyelitis  Discharge Diagnoses/Problem List:  Osteomyelitis HTN Substance abuse disorder, marijuana Tobacco use disorder Mild acute microscopic hematuria   Disposition: Home  Discharge Condition: Improved and stable  Discharge Exam:  General: Alert, in no acute distress Cardiovascular: Tachycardic, regular rhythm, no murmurs, normal S1 and S2 Respiratory: CTA bilaterally  Abdomen: non tender, non distended Extremities: dry and warm, no edema, left leg has wound VAC and is wrapped post surgery  Copied from progress note on the day of discharge   Brief Hospital Course:  .[name] is a 39 y.o. male with history of chronic osteomyelitis who presented with a purulent wound on the left knee and admitted for osteomyelitis. PMH is significant for HTN and osteomyelitis.  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 Patient met sepsis crieteria on admission. He noticed the wound became purulent and painful at approximately 3am. He was febrile (101.9 F), tachycardic (HR 130s) with leukocytosis of WBC 28k  upon admission. Pt was given IVF boluses and started on broad spectrum antibiotics (CTX, flagyl, and vanc). He developed hives after starting Vancomycin and he was switched to Linezolid. Ortho was consulted and recommended debridement. Infectious disease recommended ceftriaxone until discharge. Urine culture grew S.haemolyticus (5k colonies) and blood cultures were unremarkable. Pt had excisional debridement on  01/12/20. The wound was packed with vancomycin and gentamycin and a wound VAC was placed. Per infectious disease, pt was discharged on doxycycline 100 mg bid and Keflex 500 mg qid for 4 weeks.  Home medications were continued for other chronic diseases which remained stable.    Issues for Follow Up:  1. Pt started on Doxycycline 100 mg bid and Keflex 500 mg qid. He should complete this regimen on 11/23.   2. Patient to follow up with infectious disease in 3-4 weeks. Please remind patient about this appointment.  2. He is to follow up with Dr. Sharol Given 1 week post operative 2.  Patient had mild microscopic hematuria. Recommend repeating UA at follow-up visit.  Consider referral for cytoscopy, if hematuria continues.   Significant Procedures:  Excisional debridement of left tibial tubercle  Significant Labs and Imaging:  No results for input(s): WBC, HGB, HCT, PLT in the last 168 hours. No results for input(s): NA, K, CL, CO2, GLUCOSE, BUN, CREATININE, CALCIUM, MG, PHOS, ALKPHOS, AST, ALT, ALBUMIN, PROTEIN in the last 168 hours.  Invalid input(s): TBILI    Results/Tests Pending at Time of Discharge: wound cultures   Discharge Medications:  Allergies as of 01/14/2020      Reactions   Ciprofloxacin Other (See Comments)   Reaction=hypertension   Hydrocodone-acetaminophen Hives   Ibuprofen Hives   Penicillins Hives, Nausea Only   Did it involve swelling of the face/tongue/throat, SOB, or low BP? N Did it involve sudden or severe rash/hives, skin peeling, or any reaction on the inside of your mouth or nose? N Did you need to seek medical attention at a hospital or doctor's office? N When did it last happen?3 months ago If all above answers are "NO", may proceed with cephalosporin use.   Propoxyphene N-acetaminophen Hives  Tramadol Hcl Hives      Medication List    TAKE these medications   cephALEXin 500 MG capsule Commonly known as: KEFLEX Take 1 capsule (500 mg total) by  mouth 3 (three) times daily for 25 days. Notes to patient: Next dose is due this afternoon at 3:00 PM   doxycycline 100 MG tablet Commonly known as: VIBRA-TABS Take 1 tablet (100 mg total) by mouth every 12 (twelve) hours for 25 days. Notes to patient: Next dose is due tonight at 10:00 PM   lisinopril 10 MG tablet Commonly known as: ZESTRIL Take 10 mg by mouth daily. Notes to patient: Next dose is due tomorrow at 10:0 AM       Discharge Instructions: Please refer to Patient Instructions section of EMR for full details.  Patient was counseled important signs and symptoms that should prompt return to medical care, changes in medications, dietary instructions, activity restrictions, and follow up appointments.   Follow-Up Appointments:  Follow-up Information    Persons, [place]evely [place], [place] In 1 week.   Specialty: Orthopedic Surgery Contact information: [place] [address] [phone]        Bartholome Bill, MD. Schedule an appointment as soon as possible for a visit on 01/21/2020.   Specialty: Family Medicine Contact information: [place] [address] [phone]        Thayer Headings, MD. Call.   Specialty: Infectious Diseases Why: follow up in 3-4 weeks Contact information: [address] [place] [address] [phone]               Lyndee Hensen, DO PGY-2, [place]

## 2020-01-13 NOTE — Progress Notes (Addendum)
Family Medicine Teaching Service Daily Progress Note Intern Pager: [phone]  Patient name: [name] Medical record number: [id number] Date of birth: 08-31-1980 Age: 39 y.o. Gender: male  Primary Care Provider: Verlon Au, MD Consultants: orthopedics Code Status: Full  Pt Overview and Major Events to Date:  Admitted on 10/21 Surgical debridement of left knee d/t osteomyelitis on 10/27  Assessment and Plan:  [name] a 39 y.o.malepresenting with a purulent wound on the left knee, found to have osteomyelitis on X ray. 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 WBC count normalon 10/26 . Afebrile and BC from 10/21 show NGTD. Had excisional debridement with Dr. Lajoyce Corners yesterday, 10/27.  -PT/OT -Orthopedics on board, appreciate recommendations  -reconsult ID now that I&D has been performed for further abx recommendations.  -Contceftriaxone IV(10/21-) -Monitor BC, vitals -Tylenol as needed for discomfort -Ifre-fevers,obtain repeat blood cultures and restart vancomycin(at slow rate to avoidpreviously experienced infusion syndrome)per ID  -waiting on wound cultures  Hypertension: Chronic, stable. BP today is 117/81 -Monitor BP -Continue home lisinopril 10 mg,restarted on 10/23  Tobacco use disorder:Chronic, stable. Currently smokes 3 to 4 cigarettes/day for the past 25 years. - Nicotine lozengesPRN  Substance use disorder: Chronic, stable. Smokes marijuana every other day. -Encourage cessation  Microscopic Hematuria: Acute, mild. Clean-catch specimen,6-10 rbc/hpf seen on UA. CR WNL without any associated abdominal pain. 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 Has altered gait and spasticity/weakness in left  hand with dysarthria at baseline.   FEN/GI: Regular diet PPx: Lovenox  Disposition: Med-Surg  Subjective:  Pt is anxious to go home but in good spirits.  He denies chest pain, SOB, headache, abdominal pain, nausea, and diarrhea.   He asks if his IV can be placed somewhere else bc the current placement is uncomfortable.    Objective: Temp:  [97.3 F (36.3 C)-98.6 F (37 C)] 98 F (36.7 C) (10/28 0449) Pulse Rate:  [55-90] 80 (10/28 0449) Resp:  [13-18] 18 (10/28 0449) BP: (103-145)/(65-97) 117/81 (10/28 0449) SpO2:  [97 %-100 %] 100 % (10/28 0449) FiO2 (%):  [32 %] 32 % (10/27 1105) Physical Exam: General: comfortably resting in bed, alert, in no acute distress Cardiovascular: RRR, no murmur, S1 and S2 normal Respiratory: CTA bilaterally Abdomen: non tender, non distended Extremities: no edema, warm and dry.  Left knee wrapped in ACE bandage.  Connected to wound vac.    Laboratory: Recent Labs  Lab 01/08/20 0326 01/09/20 0156 01/11/20 0853  WBC 17.2* 10.3 7.4  HGB 12.3* 13.3 15.0  HCT 37.2* 39.2 45.4  PLT 434* 450* 467*   Recent Labs  Lab 01/08/20 0326 01/09/20 0156 01/11/20 0853  NA 140 134* 137  K 4.2 4.0 4.7  CL 105 101 103  CO2 26 22 24   BUN 21* 15 11  CREATININE 0.97 0.82 0.94  CALCIUM 8.9 9.0 9.3  PROT 6.3*  --   --   BILITOT 0.7  --   --   ALKPHOS 58  --   --   ALT 20  --   --   AST 22  --   --   GLUCOSE 96 93 100*      Imaging/Diagnostic Tests: DG Chest 1 View  Result Date: 01/06/2020 CLINICAL DATA:  Multifocal osteomyelitis EXAM: CHEST  1 VIEW COMPARISON:  06/28/2013  FINDINGS: The heart size and mediastinal contours are within normal limits. Both lungs are clear. The visualized skeletal structures are unremarkable. IMPRESSION: No active disease. Electronically Signed   By: Helyn Numbers MD   On: 01/06/2020 13:23   DG Knee 2 Views Left  Result Date: 01/06/2020 CLINICAL DATA:  Osteomyelitis EXAM: LEFT KNEE - 1-2 VIEW COMPARISON:  None.  FINDINGS: Two view radiograph left knee demonstrates superficial ulceration and soft tissue swelling anterior to the anterior tibial tubercle with erosion of the tubercle itself in keeping with subjacent osteomyelitis. There is associated thickening of the patellar tendon, likely related to inflammation or trauma. Hoffa fat is preserved. No effusion. No fracture or dislocation. Medial and lateral joint spaces are preserved. Patellofemoral joint space is not well profiled. IMPRESSION: Superficial wound and associated soft tissue swelling with subjacent osteomyelitis involving the anterior tibial tubercle. Patellar tendon thickening in keeping with inflammation or trauma. Electronically Signed   By: Helyn Numbers MD   On: 01/06/2020 13:23   DG Tibia/Fibula Left  Result Date: 01/06/2020 CLINICAL DATA:  Osteomyelitis EXAM: LEFT TIBIA AND FIBULA - 2 VIEW COMPARISON:  11/16/2018 FINDINGS: There is persistent soft tissue swelling seen anterior to the anterior tibial tubercle within associated soft tissue defect in keeping with a a superficial ulceration. There is progressive, mild erosion involving the anterior tibial tubercle when compared to prior examination in keeping with changes of osteomyelitis. There is mild thickening of the inferior patellar tendon, possibly related to inflammation or trauma. No fracture or dislocation. There is soft tissue swelling superficial to the lateral malleolus with associated small superficial ulceration identified. IMPRESSION: Soft tissue wound, swelling, and subjacent osteomyelitis involving the anterior tibial tubercle. Patellar tendon thickening in keeping with inflammation or trauma Soft tissue swelling superficial to the lateral malleolus without associated osseous erosion. And Electronically Signed   By: Helyn Numbers MD   On: 01/06/2020 13:21    Howard Pouch, Medical Student 01/13/2020, 9:22 AM OMS-IV [facility] FPTS Intern pager: [phone],  text pages welcome  Resident Addendum I have separately seen and examined the patient.  I have discussed the findings and exam with the student and agree with the above note.  I helped develop the management plan that is described in the student's note and I agree with the content.     Lenor Coffin, MD PGY-3 [facility] residency program

## 2020-01-13 NOTE — Anesthesia Postprocedure Evaluation (Signed)
Anesthesia Post Note  Patient: [name] Weight  Procedure(s) Performed: EXCISION DEBRIDEMENT LEFT TIBIAL TUBERCLE, PLACE ANTIBIOTIC BEADS (Left Leg Lower)     Patient location during evaluation: PACU Anesthesia Type: General Level of consciousness: sedated and patient cooperative Pain management: pain level controlled Vital Signs Assessment: post-procedure vital signs reviewed and stable Respiratory status: spontaneous breathing Cardiovascular status: stable Anesthetic complications: no   No complications documented.  Last Vitals:  Vitals:   01/13/20 0449 01/13/20 0939  BP: 117/81 (!) 143/96  Pulse: 80 73  Resp: 18 16  Temp: 36.7 C 36.6 C  SpO2: 100% 96%    Last Pain:  Vitals:   01/13/20 0900  TempSrc:   PainSc: 9                  Antuane Eastridge

## 2020-01-13 NOTE — Evaluation (Signed)
Occupational Therapy Evaluation Patient Details Name: [name] MRN: [id number] DOB: 07-02-1980 Today's Date: 01/13/2020    History of Present Illness Pt is a 39 y.o. male admitted 01/06/20 with chronic ulcer L tibial tubercle (s/p I&D to this site 6 years prior at [facility]). S/p L tibial tubercle I&D with antibiotic bead placement 10/27. 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 At time of eval, pt able to complete bed mobility with mod I assist and sit <> stands with supervision. He was able to complete functional mobility with unilateral support beyond a household distance.  Educated pt on fall prevention strategies to apply to home environment. Reviewed safe bathing and dressing with wound vac. Pt is at mod I level for ADLs after evaluation, OT will sign off for no other needs identified. Thank you for this consult.     Follow Up Recommendations  No OT follow up    Equipment Recommendations  None recommended by OT    Recommendations for Other Services       Precautions / Restrictions Precautions Precautions: Fall Precaution Comments: baseline L spasticity Restrictions Weight Bearing Restrictions: Yes LLE Weight Bearing: Weight bearing as tolerated      Mobility Bed Mobility Overal bed mobility: Modified Independent                  Transfers Overall transfer level: Needs assistance   Transfers: Sit to/from Stand;Stand Pivot Transfers Sit to Stand: Supervision Stand pivot transfers: Supervision       General transfer comment: braced self on IV pole. Has cane for unilateral support    Balance Overall balance assessment: Mild deficits observed, not formally tested                                          ADL either performed or assessed with clinical judgement   ADL Overall ADL's : Needs assistance/impaired Eating/Feeding: Set up;Sitting   Grooming: Set up;Sitting;Standing   Upper Body Bathing: Set up;Sitting   Lower Body Bathing: Set up;Sitting/lateral leans;Sit to/from stand   Upper Body Dressing : Set up   Lower Body Dressing: Set up;Sitting/lateral leans;Sit to/from stand Lower Body Dressing Details (indicate cue type and reason): reviewed safe dressing with wound vac Toilet Transfer: Supervision/safety;Ambulation;RW;Regular Toilet   Toileting- Clothing Manipulation and Hygiene: Set up;Sitting/lateral lean;Sit to/from stand   Tub/ Shower Transfer: Supervision/safety;Shower seat;Ambulation   Functional mobility during ADLs: Supervision/safety       Vision Patient Visual Report: No change from baseline       Perception     Praxis      Pertinent Vitals/Pain Pain Assessment: No/denies pain     Hand Dominance     Extremity/Trunk Assessment Upper Extremity Assessment Upper Extremity Assessment: Overall WFL for tasks assessed;LUE deficits/detail LUE Deficits / Details: baseline spasticity   Lower Extremity Assessment Lower Extremity Assessment: Defer to PT evaluation       Communication Communication Communication: Other (comment) (baseline dysarthria)   Cognition Arousal/Alertness: Awake/alert Behavior During Therapy: WFL for tasks assessed/performed Overall Cognitive Status: History of cognitive impairments - at baseline  General Comments: increased time and cues to problem solve higher level tasks, suspect this is baseline   General Comments       Exercises     Shoulder Instructions      Home Living Family/patient expects to be discharged to:: Private residence Living Arrangements: Other relatives (god sister) Available Help at Discharge: Family;Available PRN/intermittently Type of Home:  Apartment Home Access: Stairs to enter Entrance Stairs-Number of Steps: 15   Home Layout: One level     Bathroom Shower/Tub: Chief Strategy Officer: Standard     Home Equipment: Cane - single point (collapsible cane)          Prior Functioning/Environment Level of Independence: Independent                 OT Problem List: Decreased knowledge of use of DME or AE;Decreased knowledge of precautions      OT Treatment/Interventions:      OT Goals(Current goals can be found in the care plan section) Acute Rehab OT Goals Patient Stated Goal: return home OT Goal Formulation: All assessment and education complete, DC therapy  OT Frequency:     Barriers to D/C:            Co-evaluation              AM-PAC OT "6 Clicks" Daily Activity     Outcome Measure Help from another person eating meals?: None Help from another person taking care of personal grooming?: None Help from another person toileting, which includes using toliet, bedpan, or urinal?: None Help from another person bathing (including washing, rinsing, drying)?: None Help from another person to put on and taking off regular upper body clothing?: None Help from another person to put on and taking off regular lower body clothing?: None 6 Click Score: 24   End of Session Nurse Communication: Mobility status  Activity Tolerance: Patient tolerated treatment well Patient left: in chair;with call bell/phone within reach;with chair alarm set  OT Visit Diagnosis: Unsteadiness on feet (R26.81)                Time: 5993-5701 OT Time Calculation (min): 22 min Charges:  OT General Charges $OT Visit: 1 Visit OT Evaluation $OT Eval Moderate Complexity: 1 Mod  Dalphine Handing, MSOT, OTR/L Acute Rehabilitation Services [facility] Office Number: [phone] Pager: [phone]  Dalphine Handing 01/13/2020, 11:47 AM

## 2020-01-13 NOTE — Progress Notes (Signed)
[place] for Infectious Disease   Reason for visit: Follow up on osteomyelitis, chronic  Interval History: s/p debridement and no culture growth to date; no fever, WBC 7.4.  Baseline ESR 17, CRP 6.7.  Asking about going home.    Physical Exam: Constitutional:  Vitals:   01/13/20 0449 01/13/20 0939  BP: 117/81 (!) 143/96  Pulse: 80 73  Resp: 18 16  Temp: 98 F (36.7 C) 97.9 F (36.6 C)  SpO2: 100% 96%   patient appears in NAD Respiratory: Normal respiratory effort; CTA B Cardiovascular: RRR GI: soft, nt, nd  Review of Systems: Constitutional: negative for fevers and chills Gastrointestinal: negative for nausea and diarrhea  Lab Results  Component Value Date   WBC 7.4 01/11/2020   HGB 15.0 01/11/2020   HCT 45.4 01/11/2020   MCV 83.9 01/11/2020   PLT 467 (H) 01/11/2020    Lab Results  Component Value Date   CREATININE 0.94 01/11/2020   BUN 11 01/11/2020   NA 137 01/11/2020   K 4.7 01/11/2020   CL 103 01/11/2020   CO2 24 01/11/2020    Lab Results  Component Value Date   ALT 20 01/08/2020   AST 22 01/08/2020   ALKPHOS 58 01/08/2020     Microbiology: Recent Results (from the past 240 hour(s))  Blood Culture (routine x 2)     Status: None   Collection Time: 01/06/20 11:58 AM   Specimen: BLOOD  Result Value Ref Range Status   Specimen Description BLOOD RIGHT ANTECUBITAL  Final   Special Requests   Final    BOTTLES DRAWN AEROBIC ONLY Blood Culture results may not be optimal due to an inadequate volume of blood received in culture bottles   Culture   Final    NO GROWTH 5 DAYS Performed at [facility], [place] [address] [place], [place] [address]    Report Status 01/11/2020 FINAL  Final  Urine culture     Status: Abnormal   Collection Time: 01/06/20 11:58 AM   Specimen: In/Out Cath Urine  Result Value Ref Range Status   Specimen Description IN/OUT CATH URINE  Final   Special Requests   Final    NONE Performed at [facility],  [place] [address], [place], [place] [address]    Culture 5,000 COLONIES/mL STAPHYLOCOCCUS HAEMOLYTICUS (A)  Final   Report Status 01/08/2020 FINAL  Final   Organism ID, Bacteria STAPHYLOCOCCUS HAEMOLYTICUS (A)  Final      Susceptibility   Staphylococcus haemolyticus - MIC*    CIPROFLOXACIN <=0.5 SENSITIVE Sensitive     GENTAMICIN <=0.5 SENSITIVE Sensitive     NITROFURANTOIN <=16 SENSITIVE Sensitive     OXACILLIN >=4 RESISTANT Resistant     TETRACYCLINE <=1 SENSITIVE Sensitive     VANCOMYCIN 1 SENSITIVE Sensitive     TRIMETH/SULFA 80 RESISTANT Resistant     CLINDAMYCIN <=0.25 SENSITIVE Sensitive     RIFAMPIN <=0.5 SENSITIVE Sensitive     Inducible Clindamycin NEGATIVE Sensitive     * 5,000 COLONIES/mL STAPHYLOCOCCUS HAEMOLYTICUS  Respiratory Panel by RT PCR (Flu A&B, Covid) - Nasopharyngeal Swab     Status: None   Collection Time: 01/06/20 11:59 AM   Specimen: Nasopharyngeal Swab  Result Value Ref Range Status   SARS Coronavirus 2 by RT PCR NEGATIVE NEGATIVE Final    Comment: (NOTE) SARS-CoV-2 target nucleic acids are NOT DETECTED.  The SARS-CoV-2 RNA is generally detectable in upper respiratoy specimens during the acute phase of infection. The lowest  concentration of SARS-CoV-2 viral copies this assay can detect is 131 copies/mL. A negative result does not preclude SARS-Cov-2 infection and should not be used as the sole basis for treatment or other patient management decisions. A negative result may occur with  improper specimen collection/handling, submission of specimen other than nasopharyngeal swab, presence of viral mutation(s) within the areas targeted by this assay, and inadequate number of viral copies (<131 copies/mL). A negative result must be combined with clinical observations, patient history, and epidemiological information. The expected result is Negative.  Fact Sheet for Patients:  PinkCheek.be  Fact Sheet for Healthcare Providers:    GravelBags.it  This test is no t yet approved or cleared by the [place] FDA and  has been authorized for detection and/or diagnosis of SARS-CoV-2 by FDA under an Emergency Use Authorization (EUA). This EUA will remain  in effect (meaning this test can be used) for the duration of the COVID-19 declaration under Section 564(b)(1) of the Act, 21 U.S.C. section 360bbb-3(b)(1), unless the authorization is terminated or revoked sooner.     Influenza A by PCR NEGATIVE NEGATIVE Final   Influenza B by PCR NEGATIVE NEGATIVE Final    Comment: (NOTE) The Xpert Xpress SARS-CoV-2/FLU/RSV assay is intended as an aid in  the diagnosis of influenza from Nasopharyngeal swab specimens and  should not be used as a sole basis for treatment. Nasal washings and  aspirates are unacceptable for Xpert Xpress SARS-CoV-2/FLU/RSV  testing.  Fact Sheet for Patients: PinkCheek.be  Fact Sheet for Healthcare Providers: GravelBags.it  This test is not yet approved or cleared by the [place] FDA and  has been authorized for detection and/or diagnosis of SARS-CoV-2 by  FDA under an Emergency Use Authorization (EUA). This EUA will remain  in effect (meaning this test can be used) for the duration of the  Covid-19 declaration under Section 564(b)(1) of the Act, 21  U.S.C. section 360bbb-3(b)(1), unless the authorization is  terminated or revoked. Performed at [facility], [place] [address], [place], [place] [address]   Blood Culture (routine x 2)     Status: None   Collection Time: 01/06/20 12:03 PM   Specimen: BLOOD  Result Value Ref Range Status   Specimen Description BLOOD BLOOD RIGHT FOREARM  Final   Special Requests   Final    BOTTLES DRAWN AEROBIC AND ANAEROBIC Blood Culture adequate volume   Culture   Final    NO GROWTH 5 DAYS Performed at [facility], [place] [address], [place], [place]  [address]    Report Status 01/11/2020 FINAL  Final  Surgical pcr screen     Status: None   Collection Time: 01/11/20  4:00 PM   Specimen: Nasal Mucosa; Nasal Swab  Result Value Ref Range Status   MRSA, PCR NEGATIVE NEGATIVE Final   Staphylococcus aureus NEGATIVE NEGATIVE Final    Comment: (NOTE) The Xpert SA Assay (FDA approved for NASAL specimens in patients 25 years of age and older), is one component of a comprehensive surveillance program. It is not intended to diagnose infection nor to guide or monitor treatment. Performed at [facility], [place] [address] [place],  [address]   Aerobic/Anaerobic Culture (surgical/deep wound)     Status: None (Preliminary result)   Collection Time: 01/12/20 10:04 AM   Specimen: Soft Tissue, Other; Abscess  Result Value Ref Range Status   Specimen Description ABSCESS  Final   Special Requests LEFT TIBIA ABSC [place] A  Final  Gram Stain   Final    FEW WBC PRESENT, PREDOMINANTLY PMN NO ORGANISMS SEEN    Culture   Final    NO GROWTH < 24 HOURS Performed at [place] [address], [place], [place] [address]    Report Status PENDING  Incomplete    Impression/Plan:  1. Chronic osteomyelitis - unknown bacteria so far but s/p operative debridement by Dr. Sharol Given. ESR and CRP unremarkable.   I recommend doxycycline 100 mg bid and Keflex 500 mg 4 times a day for 4 weeks through 11/23. I will add doxy now and can continue ceftriaxone until discharge.  I will continue to monitor culture and would taper therapy based on culture growth.  He will follow up with me in about 3-4 weeks. Follow up with Dr. Sharol Given as planned.  2.  Tobacco use - he is aware this makes healing more difficult.  Advised to quit.   Otherwise the ID team will sign off

## 2020-01-13 NOTE — Progress Notes (Addendum)
ID was consulted today. Dr. Luciana Axe was informed of  [name] chronic osteomyelitis of his left knee, that he had an excisional debridement yesterday, had a wound VAC placed and that IV ceftriaxone has been continued while waiting on a wound culture per orthopedics. Dr. Luciana Axe was also informed that orthopedics have recommended switching [name] to an oral antibiotic therapy.   Await additional recommendations.     Erick Alley, MS4   Katha Cabal, DO PGY-2, [facility] 01/13/2020 1:34 PM  '

## 2020-01-14 MED ORDER — DOXYCYCLINE HYCLATE 100 MG PO TABS: 100.0000 mg | ORAL_TABLET | Freq: Two times a day (BID) | ORAL | 0 refills | Status: AC

## 2020-01-14 MED ORDER — CEPHALEXIN 500 MG PO CAPS: 500.0000 mg | ORAL_CAPSULE | Freq: Three times a day (TID) | ORAL | 0 refills | Status: AC

## 2020-01-14 NOTE — Discharge Instructions (Addendum)
Thank you for letting us care for you during your stay.  You were admitted to the [facility] Medicine Teaching Service.   You were admitted for a bone infection of your left knee called osteomyelitis.  We recommend follow up specifically for wound cultures and antibiotic use with infectious disease within  3 to 4 weeks of discharge, and with orthopedics within 1 week after your surgery.   Please follow up with your primary care physician in 1 week.   If your symptoms worsen or return, please return to the hospital.  Please let us know if you have questions about your stay at [facility].

## 2020-01-14 NOTE — Progress Notes (Signed)
Family Medicine Teaching Service Daily Progress Note Intern Pager: [phone]  Patient name: [name] Medical record number: [id number] Date of birth: Aug 28, 1980 Age: 39 y.o. Gender: male  Primary Care Provider: Verlon Au, MD Consultants: Infectious disease and orthopedics Code Status: full   Pt Overview and Major Events to Date:  Admitted on 10/21 Surgical debridement of left knee d/t osteomyelitis on 10/27  Assessment and Plan:  [name] a 39 y.o.malepresenting with a purulent wound on the left knee, found to have osteomyelitis on X ray. 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 WBC count normal. Afebrile and BC from 10/21 show NGTD. Completed excisional debridement of left tibial tubercle today with Dr. Lajoyce Corners. -Orthopedics on board, appreciate recommendations -ID consulted and recommend continuing IV Ceftriaxone until discharge, added doxycycline on 10/28 and will start Keflex upon discharge -Contceftriaxone IV(10/21-10/29 when pt will be discharged) -Monitor BC, vitals -Tylenol as needed for discomfort   Hypertension: Chronic, stable. BP today is 124/93 -Monitor BP -Continue home lisinopril 10 mg,restarted on 10/23  Tobacco use disorder:Chronic, stable. Currently smokes 3 to 4 cigarettes/day for the past 25 years. - Nicotine lozengesPRN  Substance use disorder: Chronic, stable. Smokes marijuana every other day. -Encourage cessation  Microscopic Hematuria: Acute, mild. Clean-catch specimen,6-10 rbc/hpf seen on UA. CR WNL without any associated abdominal pain. 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 Has altered gait and spasticity/weakness in left hand with dysarthria at baseline.    FEN/GI: Heart  Heathy diet  PPx: lovenox  Disposition: Med-Surg  Subjective:  Pt is ready to leave, reports feeling well.  He denies chest pain, SOB, headache, and abdominal pain. He is tachycardic at 104 bpm. Pt states this is because he is excited and anxious to go home.   Objective: Temp:  [97.9 F (36.6 C)-98.8 F (37.1 C)] 98.4 F (36.9 C) (10/29 0521) Pulse Rate:  [73-102] 79 (10/29 0521) Resp:  [16-18] 18 (10/29 0521) BP: (118-143)/(77-96) 124/93 (10/29 0521) SpO2:  [93 %-100 %] 100 % (10/29 0521) Weight:  [88.8 kg] 88.8 kg (10/29 0521) Physical Exam: General: Alert, in no acute distress Cardiovascular: Tachycardic, regular rhythm, no murmurs, normal S1 and S2 Respiratory: CTA bilaterally  Abdomen: non tender, non distended Extremities: dry and warm, no edema, left leg has wound VAC an dis wrapped pot surgery   Laboratory: Recent Labs  Lab 01/08/20 0326 01/09/20 0156 01/11/20 0853  WBC 17.2* 10.3 7.4  HGB 12.3* 13.3 15.0  HCT 37.2* 39.2 45.4  PLT 434* 450* 467*   Recent Labs  Lab 01/08/20 0326 01/09/20 0156 01/11/20 0853  NA 140 134* 137  K 4.2 4.0 4.7  CL 105 101 103  CO2 26 22 24   BUN 21* 15 11  CREATININE 0.97 0.82 0.94  CALCIUM 8.9 9.0 9.3  PROT 6.3*  --   --   BILITOT 0.7  --   --   ALKPHOS 58  --   --   ALT 20  --   --   AST 22  --   --   GLUCOSE 96 93 100*     Imaging/Diagnostic Tests: DG Chest 1 View  Result Date: 01/06/2020 CLINICAL DATA:  Multifocal osteomyelitis EXAM: CHEST  1 VIEW COMPARISON:  06/28/2013 FINDINGS: The heart size and mediastinal contours are within normal limits. Both lungs  are clear. The visualized skeletal structures are unremarkable. IMPRESSION: No active disease. Electronically Signed   By: Helyn Numbers MD   On: 01/06/2020 13:23   DG Knee 2 Views Left  Result Date: 01/06/2020 CLINICAL DATA:  Osteomyelitis EXAM: LEFT KNEE - 1-2 VIEW COMPARISON:  None. FINDINGS: Two view radiograph left knee demonstrates superficial ulceration  and soft tissue swelling anterior to the anterior tibial tubercle with erosion of the tubercle itself in keeping with subjacent osteomyelitis. There is associated thickening of the patellar tendon, likely related to inflammation or trauma. Hoffa fat is preserved. No effusion. No fracture or dislocation. Medial and lateral joint spaces are preserved. Patellofemoral joint space is not well profiled. IMPRESSION: Superficial wound and associated soft tissue swelling with subjacent osteomyelitis involving the anterior tibial tubercle. Patellar tendon thickening in keeping with inflammation or trauma. Electronically Signed   By: Helyn Numbers MD   On: 01/06/2020 13:23   DG Tibia/Fibula Left  Result Date: 01/06/2020 CLINICAL DATA:  Osteomyelitis EXAM: LEFT TIBIA AND FIBULA - 2 VIEW COMPARISON:  11/16/2018 FINDINGS: There is persistent soft tissue swelling seen anterior to the anterior tibial tubercle within associated soft tissue defect in keeping with a a superficial ulceration. There is progressive, mild erosion involving the anterior tibial tubercle when compared to prior examination in keeping with changes of osteomyelitis. There is mild thickening of the inferior patellar tendon, possibly related to inflammation or trauma. No fracture or dislocation. There is soft tissue swelling superficial to the lateral malleolus with associated small superficial ulceration identified. IMPRESSION: Soft tissue wound, swelling, and subjacent osteomyelitis involving the anterior tibial tubercle. Patellar tendon thickening in keeping with inflammation or trauma Soft tissue swelling superficial to the lateral malleolus without associated osseous erosion. And Electronically Signed   By: Helyn Numbers MD   On: 01/06/2020 13:21    Howard Pouch, Medical Student 01/14/2020, 9:19 AM OMS-IV [facility] FPTS Intern pager: [phone], text pages welcome

## 2020-01-17 LAB — AEROBIC/ANAEROBIC CULTURE W GRAM STAIN (SURGICAL/DEEP WOUND): Culture: NO GROWTH

## 2020-01-19 ENCOUNTER — Ambulatory Visit (INDEPENDENT_AMBULATORY_CARE_PROVIDER_SITE_OTHER): Payer: Medicaid Other | Admitting: Physician Assistant

## 2020-01-19 ENCOUNTER — Encounter: Payer: Self-pay | Admitting: Physician Assistant

## 2020-01-19 VITALS — Ht 73.0 in | Wt 195.0 lb

## 2020-01-19 DIAGNOSIS — M8600 Acute hematogenous osteomyelitis, unspecified site: Secondary | ICD-10-CM

## 2020-01-19 NOTE — Progress Notes (Signed)
Office Visit Note   Patient: [name]           Date of Birth: [birth year]           MRN: [id number] Visit Date: 01/19/2020              Requested by: Verlon Au, MD [address] [place],  [place] [address] PCP: Verlon Au, MD  Chief Complaint  Patient presents with  . Left Knee - Routine Post Op    01/12/20 excision debridement left tibial tubercle       HPI: Patient is a pleasant 39 year old gentleman who is 1 week status post excisional debridement of the left tibial tubercle he is on IV antibiotics he did have placement of antibiotic beads  Assessment & Plan: Visit Diagnoses: No diagnosis found.  Plan: May wash with antibacterial soap and water.  Keep a clean dry dressing in place will follow up in 1 week  Follow-Up Instructions: No follow-ups on file.   Ortho Exam  Patient is alert, oriented, no adenopathy, well-dressed, normal affect, normal respiratory effort. Incision is healing well.  No cellulitis no swelling no signs of infection  Imaging: No results found. No images are attached to the encounter.  Labs: Lab Results  Component Value Date   ESRSEDRATE 17 (H) 01/08/2020   ESRSEDRATE 47 (H) 11/16/2018   ESRSEDRATE 10 05/05/2008   CRP 6.7 (H) 01/08/2020   CRP 13.9 (H) 11/16/2018   REPTSTATUS 01/17/2020 FINAL 01/12/2020   GRAMSTAIN  01/12/2020    FEW WBC PRESENT, PREDOMINANTLY PMN NO ORGANISMS SEEN    CULT  01/12/2020    No growth aerobically or anaerobically. Performed at [facility], [address] [place], [place] [address]    LABORGA STAPHYLOCOCCUS HAEMOLYTICUS (A) 01/06/2020     Lab Results  Component Value Date   ALBUMIN 3.1 (L) 01/08/2020   ALBUMIN 3.5 11/17/2018   ALBUMIN 3.9 11/16/2018    No results found for: MG No results found for: VD25OH  No results found for: PREALBUMIN CBC EXTENDED Latest Ref Rng & Units 01/11/2020 01/09/2020 01/08/2020  WBC 4.0 - 10.5 K/uL 7.4 10.3 17.2(H)   RBC 4.22 - 5.81 MIL/uL 5.41 4.70 4.36  HGB 13.0 - 17.0 g/dL 86.7 61.9 12.3(L)  HCT 39 - 52 % 45.4 39.2 37.2(L)  PLT 150 - 400 K/uL 467(H) 450(H) 434(H)  NEUTROABS 1.7 - 7.7 K/uL - - -  LYMPHSABS 0.7 - 4.0 K/uL - - -     Body mass index is 25.73 kg/m.  Orders:  No orders of the defined types were placed in this encounter.  No orders of the defined types were placed in this encounter.    Procedures: No procedures performed  Clinical Data: No additional findings.  ROS:  All other systems negative, except as noted in the HPI. Review of Systems  Objective: Vital Signs: Ht 6\' 1"  (1.854 m)   Wt 195 lb (88.5 kg)   BMI 25.73 kg/m   Specialty Comments:  No specialty comments available.  PMFS History: Patient Active Problem List   Diagnosis Date Noted  . Ulcer of left lower leg, with necrosis of bone (HCC)   . Sepsis (HCC)   . Primary hypertension   . Leukocytosis 11/17/2018  . Osteomyelitis (HCC) 11/16/2018  . HCAP (healthcare-associated pneumonia) 06/28/2013  . Chest pain, atypical 06/28/2013  . SIRS (systemic inflammatory response syndrome) (HCC) 06/28/2013  . Hyponatremia 06/28/2013  . Headache 01/05/2013  .  Leg pain 01/05/2013  . Dizziness 01/05/2013  . 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 CIGARETTE SMOKER 06/10/2007  . DEPRESSION 06/10/2007  . 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 POSTTRAUMATIC WOUND INFECTION NEC 06/10/2007   Past Medical History:  Diagnosis Date  . Depression   . Encephalitis   . Hypertension   . Meningitis   . 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 Stroke [facility])     Family History  Problem Relation Age of Onset  . Hypertension Mother   . Hypertension Father     Past Surgical History:  Procedure Laterality Date  . I & D EXTREMITY Left 01/12/2020   Procedure: EXCISION DEBRIDEMENT LEFT TIBIAL TUBERCLE, PLACE ANTIBIOTIC BEADS;   Surgeon: Nadara Mustard, MD;  Location: [facility] OR;  Service: Orthopedics;  Laterality: Left;  . JOINT REPLACEMENT    . KNEE SURGERY     Social History   Occupational History  . Not on file  Tobacco Use  . Smoking status: Current Every Day Smoker    Packs/day: 0.25  . Smokeless tobacco: Never Used  Vaping Use  . Vaping Use: Never used  Substance and Sexual Activity  . Alcohol use: No  . Drug use: Yes    Types: Marijuana    Comment: not used in 3 weeks  . Sexual activity: Not on file

## 2020-01-28 ENCOUNTER — Ambulatory Visit: Payer: Medicaid Other | Admitting: Physician Assistant

## 2020-02-08 ENCOUNTER — Inpatient Hospital Stay: Payer: Medicaid Other | Admitting: Internal Medicine

## 2021-07-16 ENCOUNTER — Ambulatory Visit: Payer: Medicaid Other | Admitting: Physical Therapy

## 2021-07-30 ENCOUNTER — Encounter: Payer: Self-pay | Admitting: Physical Therapy

## 2021-07-30 ENCOUNTER — Ambulatory Visit: Payer: Medicaid Other | Attending: Family Medicine | Admitting: Physical Therapy

## 2021-07-30 DIAGNOSIS — R293 Abnormal posture: Secondary | ICD-10-CM | POA: Insufficient documentation

## 2021-07-30 DIAGNOSIS — R2681 Unsteadiness on feet: Secondary | ICD-10-CM | POA: Diagnosis present

## 2021-07-30 DIAGNOSIS — R2689 Other abnormalities of gait and mobility: Secondary | ICD-10-CM | POA: Insufficient documentation

## 2021-07-30 DIAGNOSIS — Z9181 History of falling: Secondary | ICD-10-CM | POA: Diagnosis present

## 2021-07-30 NOTE — Therapy (Signed)
?OUTPATIENT PHYSICAL THERAPY NEURO EVALUATION ? ? ?Patient Name: [name] ?MRN: [id number] ?DOB:[birth year], 41 y.o., male ?Today's Date: 07/30/2021 ? ?PCP: Bartholome Bill, MD ?REFERRING PROVIDER: Bartholome Bill, MD ? ? PT End of Session - 07/30/21 1533   ? ? Visit Number 1   ? Number of Visits 9   ? Date for PT Re-Evaluation 10/28/21   due to potential delay in scheduling  ? Authorization Type Medicaid - awaiting auth   ? PT Start Time 1451   ? PT Stop Time 1530   ? PT Time Calculation (min) 39 min   ? Equipment Utilized During Treatment Gait belt   ? Activity Tolerance Patient tolerated treatment well   ? Behavior During Therapy [facility] for tasks assessed/performed   ? ?  ?  ? ?  ? ? ?Past Medical History:  ?Diagnosis Date  ? Depression   ? Encephalitis   ? Hypertension   ? Meningitis   ? Paralysis, unspecified 2000  ? 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 Stroke [facility])   ? ?Past Surgical History:  ?Procedure Laterality Date  ? I & D EXTREMITY Left 01/12/2020  ? Procedure: EXCISION DEBRIDEMENT LEFT TIBIAL TUBERCLE, PLACE ANTIBIOTIC BEADS;  Surgeon: Newt Minion, MD;  Location: [facility];  Service: Orthopedics;  Laterality: Left;  ? JOINT REPLACEMENT    ? KNEE SURGERY    ? ?Patient Active Problem List  ? Diagnosis Date Noted  ? Ulcer of left lower leg, with necrosis of bone ([place])   ? Sepsis ([place])   ? Primary hypertension   ? Leukocytosis 11/17/2018  ? Osteomyelitis ([place]) 11/16/2018  ? HCAP (healthcare-associated pneumonia) 06/28/2013  ? Chest pain, atypical 06/28/2013  ? SIRS (systemic inflammatory response syndrome) ([place]) 06/28/2013  ? Hyponatremia 06/28/2013  ? Headache 01/05/2013  ? Leg pain 01/05/2013  ? Dizziness 01/05/2013  ? 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 CIGARETTE SMOKER 06/10/2007  ? DEPRESSION 06/10/2007  ? 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 POSTTRAUMATIC WOUND INFECTION NEC 06/10/2007  ? ? ?ONSET DATE:  06/29/2021 ? ?REFERRING DIAG: Z86.61 (ICD-10-CM) - Personal history of infections of the central nervous system ?G81.10 (ICD-10-CM) - Spastic hemiplegia affecting unspecified side ?Z86.73 (ICD-10-CM) - History of CVA (cerebrovascular accident) ? ? ? ?THERAPY DIAG:  ?Unsteadiness on feet ? ?Other abnormalities of gait and mobility ? ?Abnormal posture ? ?History of falling ? ?SUBJECTIVE:  ?                                                                                                                                                                                           ? ?  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 Wants to work on posture and strengthening his L side. Last time he had PT was 1996. Has had too many falls to count at home.  Pt has not hurt himself with these falls. Reports 99% of the time his LLE will give out. Uses cane as needed, but does not use it yesterday. Not doing anything for exercise at home.  ? ?Pt accompanied by: self ? ? ? ?PERTINENT HISTORY: PMH: CVA, encephalitis, HTN ? ?Pt reports hx of 14 surgeries on L knee ? ?History of encephalitis and meningitis at the age of 51 resulting in CVA with L spastic hemiplegia  ? ?PAIN:  ?Are you having pain? No ? ?PRECAUTIONS: Fall ? ?Wears an ankle brace on R side.  ? ? ?FALLS: Has patient fallen in last 6 months? Yes. Number of falls 110 (estimated) ? ?LIVING ENVIRONMENT: ?Lives with: lives alone ?Lives in: House/apartment ?Stairs: Yes: External: 15 steps; on right going up ?Has following equipment at home: Single point cane, sits on the edge of his tub.  ? ?Has a nurse aide that comes in every morning to help clean. ? ?PLOF: Independent ? ?PATIENT GOALS Wants to work on posture, balance.  ? ?OBJECTIVE:  ?  ?SENSATION: ?Light touch: Impaired  ?Pt reporting not feeling R side as well.  ? ?COORDINATION: ?Heel to shin: impaired with LLE due to hemiplegia/weakness.   ? ? ?MUSCLE TONE: LLE: Hypertonic ? ? ?POSTURE: rounded shoulders, forward head, posterior pelvic tilt, and flexed trunk  ?In standing; R rotation of upper trunk and R trunk lean.  ? ?More noted postural impairments in standing.  ? ?LE ROM:    ?Limited hamstring AROM in sitting with knee extension.  ? ?MMT:   ? ?MMT Right ?07/30/2021 Left ?07/30/2021  ?Hip flexion 3-/5 4/5  ?Hip extension    ?Hip abduction    ?Hip adduction    ?Hip internal rotation    ?Hip external rotation    ?Knee flexion 4+/5 3-/5  ?Knee extension 4/5 3+/5  ?Ankle dorsiflexion 4+/5 4+/5  ?Ankle plantarflexion    ?Ankle inversion    ?Ankle eversion    ?(Blank rows = not tested) ? ?Seated hip ADD/ABD 4/5 in sitting.  ? ? ?TRANSFERS: ?Assistive device utilized: None  ?Sit to stand: SBA ?Stand to sit: SBA ?Without UE support, incr hip/knee flexion and forward flexed posture in standing. ? ?Cues to turn all the way around before sitting down to chair.  ? ? ? ?STAIRS: ? Level of Assistance: SBA and CGA ? Stair Negotiation Technique: Alternating Pattern  ?Forwards with Bilateral Rails ? Number of Stairs: 4  ? Height of Stairs: 6  ?Comments: Pt initially trying to skip 2 steps when descending, discussed doing one step at a time for improved safety.  ? ?GAIT: ?Gait pattern: step through pattern, decreased arm swing- Right, decreased arm swing- Left, decreased step length- Right, decreased stance time- Left, decreased hip/knee flexion- Right, decreased hip/knee flexion- Left, Right foot flat, Left foot flat, knee flexed in stance- Right, knee flexed in stance- Left, decreased trunk rotation, and trunk flexed ?Distance walked: Clinic distances.  ?Assistive device utilized: None ?Level of assistance: SBA and CGA ? ? ?FUNCTIONAL TESTs:  ?5 times sit to stand: 11.82 seconds w/ no UE support  ?30 seconds chair stand test: 11 sit <> stands w/ no UE support  ?Timed up and go (TUG): 16.1 seconds  with no AD, min guard when turning.  ?10 meter walk test: 10.31  seconds with no AD = 3.18 ft/sec ? ? [facility] PT Assessment - 07/30/21 1523   ? ?  ? Standardized Balance Assessment  ? Standardized Balance Assessment Berg Balance Test   ?  ? Berg Balance Test  ? Sit to Stand Able to stand without using hands and stabilize independently   ? Standing Unsupported Able to stand safely 2 minutes   ? Sitting with Back Unsupported but Feet Supported on Floor or Stool Able to sit safely and securely 2 minutes   ? Stand to Sit Sits safely with minimal use of hands   ? Standing Unsupported with Eyes Closed Able to stand 10 seconds safely   ? ?  ?  ? ?  ? ?Will finish at next session.  ? ?TODAY'S TREATMENT:  ?N/A at eval.  ? ? ?PATIENT EDUCATION: ?Education details: Clinical findings, POC, Medicaid visit limit, discussed OT referral due to LUE deficits, pt plans to reach out to his PCP for a referral.  ?Person educated: Patient ?Education method: Explanation ?Education comprehension: verbalized understanding ? ? ?HOME EXERCISE PROGRAM: ?Will provide at next session.  ? ? ? ?GOALS: ?Goals reviewed with patient? Yes ? ?SHORT TERM GOALS: Target date: 08/27/2021    ? ?Pt will be independent with initial HEP in order to build upon functional gains made in therapy. ? ?Baseline: pt not doing anything for exercise at home.  ?Goal status: INITIAL ? ?2.  Pt will undergo further assessment of BERG with LTG written.  ?Baseline: not yet assessed.  ?Goal status: INITIAL ? ?3.  Pt will ambulate at least 4' with LRAD with supervision over level indoor surfaces in order to demo improved safety with gait at home due to fall risk.  ?Baseline: pt ambulating with no AD at home.  ?Goal status: INITIAL ? ? ? ?LONG TERM GOALS: Target date: 09/24/2021   ? ?Pt will be independent with final HEP in order to build upon functional gains made in therapy. ?Baseline: pt not doing anything for exercise at home.  ?Goal status: INITIAL ? ?2.  BERG goal to be written in order to demo decr fall risk.  ?Baseline: Not yet  assessed. ?Goal status: INITIAL ? ?3.  Pt will decr TUG with no AD to 13.5 seconds or less with supervision in order to demo decr fall risk.  ?Baseline: 16.1 seconds with no AD, min guard when turning.  ?Goal status: INITIAL ? ?4.

## 2021-08-09 ENCOUNTER — Encounter: Payer: Medicaid Other | Admitting: Occupational Therapy

## 2021-08-09 ENCOUNTER — Encounter: Payer: Self-pay | Admitting: Physical Therapy

## 2021-08-09 ENCOUNTER — Ambulatory Visit: Payer: Medicaid Other | Admitting: Physical Therapy

## 2021-08-09 DIAGNOSIS — Z9181 History of falling: Secondary | ICD-10-CM

## 2021-08-09 DIAGNOSIS — R2689 Other abnormalities of gait and mobility: Secondary | ICD-10-CM

## 2021-08-09 DIAGNOSIS — R293 Abnormal posture: Secondary | ICD-10-CM

## 2021-08-09 DIAGNOSIS — R2681 Unsteadiness on feet: Secondary | ICD-10-CM | POA: Diagnosis not present

## 2021-08-09 NOTE — Therapy (Signed)
OUTPATIENT PHYSICAL THERAPY TREATMENT NOTE   Patient Name: [name] MRN: [id number] DOB:06-29-1980, 41 y.o., male Today's Date: 08/09/2021  PCP: Bartholome Bill, MD  REFERRING PROVIDER: Bartholome Bill, MD   END OF SESSION:   PT End of Session - 08/09/21 [phone]     Visit Number 2    Number of Visits 9    Date for PT Re-Evaluation 10/28/21   due to potential delay in scheduling   Authorization Type Medicaid - awaiting auth    PT Start Time 0933    PT Stop Time 1013    PT Time Calculation (min) 40 min    Equipment Utilized During Treatment Gait belt    Activity Tolerance Patient tolerated treatment well    Behavior During Therapy WFL for tasks assessed/performed             Past Medical History:  Diagnosis Date   Depression    Encephalitis    Hypertension    Meningitis    Paralysis, 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   Stroke [facility])    Past Surgical History:  Procedure Laterality Date   I & D EXTREMITY Left 01/12/2020   Procedure: EXCISION DEBRIDEMENT LEFT TIBIAL TUBERCLE, PLACE ANTIBIOTIC BEADS;  Surgeon: Newt Minion, MD;  Location: [place];  Service: Orthopedics;  Laterality: Left;   JOINT REPLACEMENT     KNEE SURGERY     Patient Active Problem List   Diagnosis Date Noted   Ulcer of left lower leg, with necrosis of bone ([place])    Sepsis ([place])    Primary hypertension    Leukocytosis 11/17/2018   Osteomyelitis ([place]) 11/16/2018   HCAP (healthcare-associated pneumonia) 06/28/2013   Chest pain, atypical 06/28/2013   SIRS (systemic inflammatory response syndrome) ([place]) 06/28/2013   Hyponatremia 06/28/2013   Headache 01/05/2013   Leg pain 01/05/2013   Dizziness 01/05/2013   ALCOHOL ABUSE, IN REMISSION 06/10/2007   CIGARETTE SMOKER 06/10/2007   DEPRESSION 06/10/2007   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   HEMIPLEGIA, SPASTIC, NONDOMINANT SIDE 06/10/2007   POSTTRAUMATIC WOUND INFECTION NEC 06/10/2007    REFERRING  DIAG: Z86.61 (ICD-10-CM) - Personal history of infections of the central nervous system G81.10 (ICD-10-CM) - Spastic hemiplegia affecting unspecified side Z86.73 (ICD-10-CM) - History of CVA (cerebrovascular accident  THERAPY DIAG:  Unsteadiness on feet  Other abnormalities of gait and mobility  Abnormal posture  History of falling  Rationale for Evaluation and Treatment Rehabilitation  PERTINENT HISTORY: PMH: CVA, encephalitis, HTN   Pt reports hx of 14 surgeries on L knee   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     SUBJECTIVE: No new complaints. No fall or pain today. Reports he bought some weights and has been working his arms and walking around the complex with his cane (does have his cane with him today).  PAIN:  Are you having pain? No  TODAY'S TREATMENT:  08/09/2021  [facility] PT Assessment - 08/09/21 0940       Berg Balance Test   Sit to Stand Able to stand without using hands and stabilize independently    Standing Unsupported Able to stand safely 2 minutes    Sitting with Back Unsupported but Feet Supported on Floor or Stool Able to sit safely and securely 2 minutes    Stand to Sit Sits safely with  minimal use of hands    Transfers Able to transfer safely, definite need of hands    Standing Unsupported with Eyes Closed Able to stand 10 seconds safely   started at this point this session   Standing Unsupported with Feet Together Able to place feet together independently and stand 1 minute safely    From Standing, Reach Forward with Outstretched Arm Can reach confidently >25 cm (10")    From Standing Position, Pick up Object from Floor Able to pick up shoe, needs supervision    From Standing Position, Turn to Look Behind Over each Shoulder Looks behind one side only/other side shows less weight shift   left>right   Turn 360 Degrees Able to turn 360  degrees safely but slowly   >6 seconds, no assistance needed   Standing Unsupported, Alternately Place Feet on Step/Stool Able to complete >2 steps/needs minimal assist   min HHA   Standing Unsupported, One Foot in Front Able to take small step independently and hold 30 seconds    Standing on One Leg Tries to lift leg/unable to hold 3 seconds but remains standing independently    Total Score 43    Berg comment: 43/56 significant risk for falls         BALANCE/NMR: Issued ex's to HEP today for strengthening, stretching and balance. No issues noted or reported with performance in session. Refer to [place] program below for full details. Cues needed on correct form and technique. Min guard assist for safety with standing balance ex's.     GAIT: Gait pattern: step through pattern, decreased step length- Right, decreased step length- Left, decreased stride length, knee flexed in stance- Right, knee flexed in stance- Left, trunk flexed, and narrow BOS Distance walked: around clinic with session Assistive device utilized: Single point cane Level of assistance: min guard assist Comments: mild veering noted at times. Pt with good sequencing noted with use of cane.    PATIENT EDUCATION: Education details: results of Furniture conservator/restorer; initial HEP  Person educated: Patient Education method: Explanation, Demonstration, Verbal cues, Handouts Education comprehension: verbalized understanding, return demonstration, needs reinforcement     HOME EXERCISE PROGRAM: Access Code: [email or web address] URL: [email or web address] Date: 08/09/2021 Prepared by: Willow Ora  Exercises - Seated Hamstring Stretch  - 1 x daily - 5 x weekly - 1 sets - 3 reps - 30 seconds hold - Sit to Stand with Arms Crossed  - 1 x daily - 5 x weekly - 1 sets - 10 reps - Heel Raises with Counter Support  - 1 x daily - 5 x weekly - 1 sets - 10 reps - Seated Knee Extension with Resistance  - 1 x daily - 5 x weekly - 1 sets  - 10 reps     GOALS: Goals reviewed with patient? Yes   SHORT TERM GOALS: Target date: 08/27/2021      Pt will be independent with initial HEP in order to build upon functional gains made in therapy.   Baseline: pt not doing anything for exercise at home.  Goal status: INITIAL   2.  Pt will undergo further assessment of BERG with LTG written.  Baseline: 08/09/21: 43/56 scored as baseline  Goal status: MET   3.  Pt will ambulate at least 71' with LRAD with supervision over level indoor surfaces in order to demo improved safety with gait at home due to fall risk.  Baseline: pt ambulating with no AD at home.  Goal status: INITIAL  LONG TERM GOALS: Target date: 09/24/2021     Pt will be independent with final HEP in order to build upon functional gains made in therapy. Baseline: pt not doing anything for exercise at home.  Goal status: INITIAL   2.  BERG goal to be written in order to demo decr fall risk.  Baseline: Not yet assessed. Goal status: INITIAL   3.  Pt will decr TUG with no AD to 13.5 seconds or less with supervision in order to demo decr fall risk.  Baseline: 16.1 seconds with no AD, min guard when turning.  Goal status: INITIAL   4.  Pt will go up and down 12 steps with single vs. Bilat handrail with alternating pattern in order to safely get in and out of his apartment  Baseline: needs cues for proper sequencing.  Goal status: INITIAL   5.  Pt will perform at least 13 sit <> stands with no UE support in 30 seconds in order to demo improved functional strength/endurance.  Baseline: 11 sit <> stands w/ no UE support  Goal status: INITIAL     ASSESSMENT:   CLINICAL IMPRESSION: Today's skilled session focused on completion of Berg Balance test with score of 43/56. Primary PT to update LTG. Remainder of session focused on establishment of an HEP for stretching, strengthening and balance. No issues noted or reported in session. The pt is progressing and should  benefit from continued PT to progress toward unmet goals.        OBJECTIVE IMPAIRMENTS Abnormal gait, decreased activity tolerance, decreased balance, decreased coordination, decreased endurance, decreased knowledge of use of DME, difficulty walking, decreased ROM, decreased strength, decreased safety awareness, increased muscle spasms, impaired flexibility, impaired tone, impaired UE functional use, and postural dysfunction.    ACTIVITY LIMITATIONS cleaning and community activity.    PERSONAL FACTORS Age, Behavior pattern, Past/current experiences, Time since onset of injury/illness/exacerbation, and 3+ comorbidities: CVA, encephalitis, HTN  are also affecting patient's functional outcome.      REHAB POTENTIAL: Fair due to chronicity of condition    CLINICAL DECISION MAKING: Evolving/moderate complexity   EVALUATION COMPLEXITY: Moderate   PLAN: PT FREQUENCY: 1x/week   PT DURATION: 12 weeks   PLANNED INTERVENTIONS: Therapeutic exercises, Therapeutic activity, Neuromuscular re-education, Balance training, Gait training, Patient/Family education, Stair training, Vestibular training, DME instructions, and Manual therapy   PLAN FOR NEXT SESSION: PT to update Berg goal.  Add additional stretches and balance ex's to HEP. Continue to work on gait training with cane.      Willow Ora, PTA, [place] [address] [place], [place] [address] [phone] 08/09/21, 12:09 PM

## 2021-08-14 NOTE — Therapy (Signed)
OUTPATIENT OCCUPATIONAL THERAPY NEURO EVALUATION  Patient Name: [name] MRN: [id number] DOB:16-Mar-1981, 41 y.o., male Today's Date: 08/16/2021  PCP: Dr. 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   Stroke [facility])    Past Surgical History:  Procedure Laterality Date   I & D EXTREMITY Left 01/12/2020   Procedure: EXCISION DEBRIDEMENT LEFT TIBIAL TUBERCLE, PLACE ANTIBIOTIC BEADS;  Surgeon: Nadara Mustarduda, Marcus V, MD;  Location: [facility];  Service: Orthopedics;  Laterality: Left;   JOINT REPLACEMENT     KNEE SURGERY     Patient Active Problem List   Diagnosis Date Noted   Ulcer of left lower leg, with necrosis of bone ([facility])    Sepsis ([facility])    Primary hypertension    Leukocytosis 11/17/2018   Osteomyelitis ([facility]) 11/16/2018   HCAP (healthcare-associated pneumonia) 06/28/2013   Chest pain, atypical 06/28/2013   SIRS (systemic inflammatory response syndrome) ([facility]) 06/28/2013   Hyponatremia 06/28/2013   Headache 01/05/2013   Leg pain 01/05/2013   Dizziness 01/05/2013   ALCOHOL ABUSE, IN REMISSION 06/10/2007   CIGARETTE SMOKER 06/10/2007   DEPRESSION 06/10/2007   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   HEMIPLEGIA, SPASTIC, NONDOMINANT SIDE 06/10/2007   POSTTRAUMATIC WOUND INFECTION NEC 06/10/2007    ONSET DATE: 08/08/21 referral date  REFERRING DIAG: G81.10 (ICD-10-CM) - Spastic hemiplegia affecting  unspecified side   THERAPY DIAG:  Hemiplegia and hemiparesis following cerebral infarction affecting left non-dominant side ([facility])  Other symptoms and signs involving the musculoskeletal system  Other lack of coordination  Unsteadiness on feet  Abnormal posture  Rationale for Evaluation and Treatment Rehabilitation  SUBJECTIVE:   SUBJECTIVE STATEMENT: Pt reports that he has not had any OT since initial illness/hospitalization    Pt accompanied by: self  PERTINENT HISTORY: Pt with history of encephalitis and meningitis at the age of 41 resulting in CVA with L spastic hemiplegia.   PMH: CVA, encephalitis, HTN, depression, Pt reports hx of 14 surgeries on L knee    PRECAUTIONS: Fall  WEIGHT BEARING RESTRICTIONS No  PAIN:  Are you having pain? No  FALLS: Has patient fallen in last 6 months? Yes. Number of falls many, last over 3 weeks ago  LIVING ENVIRONMENT: Lives with: lives alone Lives in: apartment Stairs: Yes: External: 15 steps; on right going up Has following equipment at home: Single point cane as needed  Has a nurse aide that comes in every morning to help clean, some cooking, remind pt to take medication  PLOF: Independent with basic ADLs and Vocation/Vocational requirements: on disability  PATIENT GOALS  strengthen L hand, be able to hold objects in place easier  OBJECTIVE:   HAND DOMINANCE: Right  ADLs: Transfers/ambulation related to ADLs:  mod I--see PT eval for details Eating: mod  I, difficulty cutting food with fork/knife Grooming: mod I.  UB Dressing: difficulty with fasteners.  Pt fastened/unfastened 3 buttons in 34.75sec. LB Dressing: incr time/difficulty for tying Toileting: mod I Bathing: mod I with min difficulty Tub Shower transfers: mod I but pt reports significant difficulty with shower transfer.   Equipment:  bathtub/shower combo, no grab bars   IADLs: Shopping: mod I, gets groceries delivered by [facility]: mod I,  difficulty picking up items from floor  Meal Prep: standing in kitchen is difficult, cutting food is difficult, difficulty carrying/lifting pots/pan  Community mobility: independent, drives more with the LUE Medication management: aide reminds pt to take medication Handwriting:  uses R hand  MOBILITY STATUS: Hx of falls and uses single point cane prn.  POSTURE COMMENTS:  rounded shoulders, flexed trunk , and weight shift left, noted decr trunk stability with resisted UE movement  FUNCTIONAL OUTCOME MEASURES: FOTO: n/a due to chronic CVA  UPPER EXTREMITY ROM:   LUE grossly WFL, but noted unable to fully extend fingers with wrist ext, and noted PIP flex at times with movement due to spasticity.   UPPER EXTREMITY MMT:   BUE strength proximal strength grossly 5/5.    HAND FUNCTION: Grip strength: Right: 87.9 lbs; Left: 69.6 lbs, Lateral pinch: Right: 20 lbs, Left: 18 lbs, 3 point pinch: Right: 13 lbs, Left: 12 lbs, and Tip pinch: Right 20 lbs, Left: 21 lbs  COORDINATION: 9 Hole Peg test: Right: 30.94 sec; Left: 27.22 sec  SENSATION: Pt reports decr hot/cold sensation    MUSCLE TONE: LUE: Mild and primarily in L hand and noted with coordination tasks/positioning of hand/fingers  COGNITION: Overall cognitive status: Within functional limits for tasks assessed.  Noted dysarthria at times.    VISION: Subjective report: WFL per pt report   TODAY'S TREATMENT:  N/a   PATIENT EDUCATION: Education details: OT eval results, POC.  Recommend that pt look into requesting grab bars in shower from landlord (pt completed request online during eval) Person educated: Patient Education method: Explanation Education comprehension: verbalized understanding   HOME EXERCISE PROGRAM: Not yet issued    GOALS: Potential Goals reviewed with patient? Yes  SHORT TERM GOALS: Target date: 09/21/21  Pt will be independent with initial HEP for LUE. Baseline:  dependent, no formal HEP Goal  status: INITIAL  2.  Pt will be independent with ways to decr tone in LUE (weightbearing, stretching, splinting prn). Baseline:  pt with min spasticity in LUE which affects functional use Goal status: INITIAL  3.  Pt will improve L grip strength by at least 4lbs to assist in stabilizing objects. Baseline:  69.6lbs Goal status: INITIAL     LONG TERM GOALS: Target date: 10/22/21  Pt will be independent with updated HEP for LUE. Baseline:  dependent, no formal HEP Goal status: INITIAL  2.  Pt will verbalize understanding of adaptive strategies for bilateral UE tasks and report incr ease with cutting food, fastening buttons, fastening zippers. Baseline:  pt reports difficulty with including cutting food, fastening buttons, fastening zippers Goal status: INITIAL  3.  Pt will verbalize understanding of adaptive strategies to incr safety/decr falls during IADLs.  Baseline:   pt reports hx of many falls, pt reports difficulty carrying pots/pans and difficulty with shower transfer. Goal status: INITIAL      ASSESSMENT:  CLINICAL IMPRESSION: Patient is a 41 y.o. male who was seen today for occupational therapy evaluation for L spastic hemiplegia.  Pt with history of encephalitis and meningitis at the  age of 73 resulting in CVA with L spastic hemiplegia.  Pt reports that he has had no occupational therapy since initial illness/hospitalization.  PMH also includes:  HTN, depression, and pt reports hx of 14 surgeries on L knee.  Pt presents today with L spastic hemiparesis with decr coordination and LUE functional use, abnormal posture, and decr balance for ADLs/IADLs.  PERFORMANCE DEFICITS in functional skills including ADLs, IADLs, coordination, dexterity, sensation, tone, ROM, strength, flexibility, FMC, mobility, balance, body mechanics, decreased knowledge of use of DME, and UE functional use,  and psychosocial skills including habits and routines and behaviors.   IMPAIRMENTS are limiting  patient from ADLs, IADLs, and leisure.   COMORBIDITIES may have co-morbidities  that affects occupational performance. Patient will benefit from skilled OT to address above impairments and improve overall function.  MODIFICATION OR ASSISTANCE TO COMPLETE EVALUATION: Min-Moderate modification of tasks or assist with assess necessary to complete an evaluation.  OT OCCUPATIONAL PROFILE AND HISTORY: Detailed assessment: Review of records and additional review of physical, cognitive, psychosocial history related to current functional performance.  CLINICAL DECISION MAKING: Moderate - several treatment options, min-mod task modification necessary  REHAB POTENTIAL: Good  EVALUATION COMPLEXITY: Moderate    PLAN: OT FREQUENCY: 1x/week  OT DURATION: 8 weeks +evaluation  PLANNED INTERVENTIONS: self care/ADL training, therapeutic exercise, therapeutic activity, neuromuscular re-education, manual therapy, passive range of motion, balance training, functional mobility training, aquatic therapy, splinting, electrical stimulation, ultrasound, paraffin, fluidotherapy, moist heat, cryotherapy, patient/family education, energy conservation, and DME and/or AE instructions  RECOMMENDED OTHER SERVICES: current with PT  CONSULTED AND AGREED WITH PLAN OF CARE: Patient  PLAN FOR NEXT SESSION: HEP for coordination and wt. Bearing  LUE   Lynnea Vandervoort, OT 08/16/2021, 2:37 PM  Willa Frater, OTR/L [facility] [address] [place], [place]  [address] [phone] phone [phone] 08/16/21 2:37 PM

## 2021-08-16 ENCOUNTER — Ambulatory Visit: Payer: Medicaid Other | Admitting: Occupational Therapy

## 2021-08-16 ENCOUNTER — Encounter: Payer: Self-pay | Admitting: Physical Therapy

## 2021-08-16 ENCOUNTER — Encounter: Payer: Self-pay | Admitting: Occupational Therapy

## 2021-08-16 ENCOUNTER — Ambulatory Visit: Payer: Medicaid Other | Attending: Family Medicine | Admitting: Physical Therapy

## 2021-08-16 DIAGNOSIS — R29898 Other symptoms and signs involving the musculoskeletal system: Secondary | ICD-10-CM | POA: Diagnosis present

## 2021-08-16 DIAGNOSIS — Z9181 History of falling: Secondary | ICD-10-CM | POA: Insufficient documentation

## 2021-08-16 DIAGNOSIS — R293 Abnormal posture: Secondary | ICD-10-CM | POA: Diagnosis present

## 2021-08-16 DIAGNOSIS — R2689 Other abnormalities of gait and mobility: Secondary | ICD-10-CM | POA: Insufficient documentation

## 2021-08-16 DIAGNOSIS — R2681 Unsteadiness on feet: Secondary | ICD-10-CM | POA: Insufficient documentation

## 2021-08-16 DIAGNOSIS — R278 Other lack of coordination: Secondary | ICD-10-CM

## 2021-08-16 DIAGNOSIS — I69354 Hemiplegia and hemiparesis following cerebral infarction affecting left non-dominant side: Secondary | ICD-10-CM | POA: Diagnosis present

## 2021-08-16 NOTE — Therapy (Signed)
OUTPATIENT PHYSICAL THERAPY TREATMENT NOTE   Patient Name: [name] MRN: [id number] DOB:November 04, 1980, 41 y.o., male Today's Date: 08/16/2021  PCP: Bartholome Bill, MD  REFERRING PROVIDER: Bartholome Bill, MD   END OF SESSION:   PT End of Session - 08/16/21 1449     Visit Number 3    Number of Visits 9    Date for PT Re-Evaluation 10/28/21   due to potential delay in scheduling   Authorization Type Medicaid - 3 PT visits approved 08/09/21 - 08/29/21    Authorization - Visit Number 2    Authorization - Number of Visits 3    PT Start Time 1914    PT Stop Time 1527    PT Time Calculation (min) 40 min    Equipment Utilized During Treatment Gait belt    Activity Tolerance Patient tolerated treatment well    Behavior During Therapy WFL for tasks assessed/performed             Past Medical History:  Diagnosis Date   Depression    Encephalitis    Hypertension    Meningitis    Paralysis, 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   Stroke [facility])    Past Surgical History:  Procedure Laterality Date   I & D EXTREMITY Left 01/12/2020   Procedure: EXCISION DEBRIDEMENT LEFT TIBIAL TUBERCLE, PLACE ANTIBIOTIC BEADS;  Surgeon: Newt Minion, MD;  Location: [place];  Service: Orthopedics;  Laterality: Left;   JOINT REPLACEMENT     KNEE SURGERY     Patient Active Problem List   Diagnosis Date Noted   Ulcer of left lower leg, with necrosis of bone ([place])    Sepsis ([place])    Primary hypertension    Leukocytosis 11/17/2018   Osteomyelitis ([place]) 11/16/2018   HCAP (healthcare-associated pneumonia) 06/28/2013   Chest pain, atypical 06/28/2013   SIRS (systemic inflammatory response syndrome) ([place]) 06/28/2013   Hyponatremia 06/28/2013   Headache 01/05/2013   Leg pain 01/05/2013   Dizziness 01/05/2013   ALCOHOL ABUSE, IN REMISSION 06/10/2007   CIGARETTE SMOKER 06/10/2007   DEPRESSION 06/10/2007   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   HEMIPLEGIA,  SPASTIC, NONDOMINANT SIDE 06/10/2007   POSTTRAUMATIC WOUND INFECTION NEC 06/10/2007    REFERRING DIAG: Z86.61 (ICD-10-CM) - Personal history of infections of the central nervous system G81.10 (ICD-10-CM) - Spastic hemiplegia affecting unspecified side Z86.73 (ICD-10-CM) - History of CVA (cerebrovascular accident  THERAPY DIAG:  Unsteadiness on feet  Abnormal posture  Other abnormalities of gait and mobility  History of falling  Rationale for Evaluation and Treatment Rehabilitation  PERTINENT HISTORY: PMH: CVA, encephalitis, HTN   Pt reports hx of 14 surgeries on L knee   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     SUBJECTIVE: Brought in cane again today, reports exercises are going well at home. No falls.   PAIN:  Are you having pain? No  TODAY'S TREATMENT:   STRENGTHENING:  SciFit with BLE/BUE at gear 3.5 > 5.0 for strengthening, ROM, activity tolerance for 8 minutes. Cues for full ROM with knee extension  With 6" step; x10 reps each leg forward step ups with single UE support, then performed x10 reps step ups with contralateral march with single UE support, cues for posture and to keep a soft bend in LLE as pt tends to have  hyperextension.  10 reps bridging with 5 second hold for posterior chain/hip extensor strengthening, added to HEP 10 reps mini squats with 5 second hold in mini squat position and then cues for hip extension when coming back to upright. Pt reporting feeling good with these.   BALANCE/NMR: On level ground; alternating toe taps to 6" step x10 reps each leg, then progressed to standing on air ex x10 reps each side. Pt needing tactile and verbal cues to keep a soft bend in LLE, pt with incr knee hyperextension with SLS tasks.  Then performed on air ex with 2 cones alternating forward taps to each with pt needing to use UE support x8 reps  each side.    GAIT: Gait pattern: step through pattern, decreased step length- Right, decreased step length- Left, decreased stride length, knee flexed in stance- Right, knee flexed in stance- Left, trunk flexed, and narrow BOS Distance walked: around clinic with session Assistive device utilized: Single point cane Level of assistance: supervision/min guard assist Comments: Pt with good sequencing noted with use of cane.    PATIENT EDUCATION: Education details: Added bridging to HEP for hip extension strengthening.  Person educated: Patient Education method: Explanation, Demonstration, Verbal cues, Handouts Education comprehension: verbalized understanding, return demonstration, needs reinforcement     HOME EXERCISE PROGRAM: Access Code: [email or web address] URL: [email or web address] Date: 08/09/2021 Prepared by: Willow Ora  Exercises - Seated Hamstring Stretch  - 1 x daily - 5 x weekly - 1 sets - 3 reps - 30 seconds hold - Sit to Stand with Arms Crossed  - 1 x daily - 5 x weekly - 1 sets - 10 reps - Heel Raises with Counter Support  - 1 x daily - 5 x weekly - 1 sets - 10 reps - Seated Knee Extension with Resistance  - 1 x daily - 5 x weekly - 1 sets - 10 reps     GOALS: Goals reviewed with patient? Yes   SHORT TERM GOALS: Target date: 08/27/2021      Pt will be independent with initial HEP in order to build upon functional gains made in therapy.   Baseline: pt not doing anything for exercise at home.  Goal status: INITIAL   2.  Pt will undergo further assessment of BERG with LTG written.  Baseline: 08/09/21: 43/56 scored as baseline  Goal status: MET   3.  Pt will ambulate at least 74' with LRAD with supervision over level indoor surfaces in order to demo improved safety with gait at home due to fall risk.  Baseline: pt ambulating with no AD at home.  Goal status: INITIAL       LONG TERM GOALS: Target date: 09/24/2021     Pt will be independent with final HEP in  order to build upon functional gains made in therapy. Baseline: pt not doing anything for exercise at home.  Goal status: INITIAL   2.  Pt will improve BERG to at least a 47/56 in order to demo decr fall risk.  Baseline: 43/56 Goal status: INITIAL   3.  Pt will decr TUG with no AD to 13.5 seconds or less with supervision in order to demo decr fall risk.  Baseline: 16.1 seconds with no AD, min guard when turning.  Goal status: INITIAL   4.  Pt will go up and down 12 steps with single vs. Bilat handrail with alternating pattern in order to safely get in and out of his apartment  Baseline: needs cues for proper sequencing.  Goal status: INITIAL   5.  Pt will perform at least 13 sit <> stands with no UE support in 30 seconds in order to demo improved functional strength/endurance.  Baseline: 11 sit <> stands w/ no UE support  Goal status: INITIAL     ASSESSMENT:   CLINICAL IMPRESSION: Today's skilled session focused on BLE strengthening and SLS tasks. Pt tolerated session well, did need cues to keep a soft bend in his LLE due to incr knee hyperextension during SLS tasks. Added bridging to pt's HEP for posterior chain strengthening. Will continue to progress towards LTGs.        OBJECTIVE IMPAIRMENTS Abnormal gait, decreased activity tolerance, decreased balance, decreased coordination, decreased endurance, decreased knowledge of use of DME, difficulty walking, decreased ROM, decreased strength, decreased safety awareness, increased muscle spasms, impaired flexibility, impaired tone, impaired UE functional use, and postural dysfunction.    ACTIVITY LIMITATIONS cleaning and community activity.    PERSONAL FACTORS Age, Behavior pattern, Past/current experiences, Time since onset of injury/illness/exacerbation, and 3+ comorbidities: CVA, encephalitis, HTN  are also affecting patient's functional outcome.      REHAB POTENTIAL: Fair due to chronicity of condition    CLINICAL DECISION  MAKING: Evolving/moderate complexity   EVALUATION COMPLEXITY: Moderate   PLAN: PT FREQUENCY: 1x/week   PT DURATION: 12 weeks   PLANNED INTERVENTIONS: Therapeutic exercises, Therapeutic activity, Neuromuscular re-education, Balance training, Gait training, Patient/Family education, Stair training, Vestibular training, DME instructions, and Manual therapy   PLAN FOR NEXT SESSION: Add additional stretches and balance ex's to HEP as needed. Continue to work on gait training with cane. Standing balance and functional strengthening. Work on [employer]. SciFit for ROM/aerobic activity.   Janann August, PT, DPT  08/16/21, 3:41 PM

## 2021-08-20 ENCOUNTER — Ambulatory Visit: Payer: Medicaid Other | Admitting: Physical Therapy

## 2021-08-20 ENCOUNTER — Encounter: Payer: Self-pay | Admitting: Physical Therapy

## 2021-08-20 DIAGNOSIS — I69354 Hemiplegia and hemiparesis following cerebral infarction affecting left non-dominant side: Secondary | ICD-10-CM

## 2021-08-20 DIAGNOSIS — R278 Other lack of coordination: Secondary | ICD-10-CM

## 2021-08-20 DIAGNOSIS — R2681 Unsteadiness on feet: Secondary | ICD-10-CM | POA: Diagnosis not present

## 2021-08-20 DIAGNOSIS — R29898 Other symptoms and signs involving the musculoskeletal system: Secondary | ICD-10-CM

## 2021-08-20 NOTE — Therapy (Incomplete)
OUTPATIENT PHYSICAL THERAPY TREATMENT NOTE   Patient Name: [name] MRN: [id number] DOB:1980-09-20, 41 y.o., male Today's Date: 08/21/2021  PCP: Bartholome Bill, MD  REFERRING PROVIDER: Bartholome Bill, MD   END OF SESSION:   PT End of Session - 08/20/21 1450     Visit Number 4    Number of Visits 9    Date for PT Re-Evaluation 10/28/21   due to potential delay in scheduling   Authorization Type Medicaid - 3 PT visits approved 08/09/21 - 08/29/21; resubmitted auth for remaining visits on 08/21/2021    Authorization - Visit Number 3    Authorization - Number of Visits 3    PT Start Time 4098    PT Stop Time 1530    PT Time Calculation (min) 44 min    Equipment Utilized During Treatment Gait belt    Activity Tolerance Patient tolerated treatment well    Behavior During Therapy WFL for tasks assessed/performed             Past Medical History:  Diagnosis Date   Depression    Encephalitis    Hypertension    Meningitis    Paralysis, 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   Stroke [facility])    Past Surgical History:  Procedure Laterality Date   I & D EXTREMITY Left 01/12/2020   Procedure: EXCISION DEBRIDEMENT LEFT TIBIAL TUBERCLE, PLACE ANTIBIOTIC BEADS;  Surgeon: Newt Minion, MD;  Location: [place];  Service: Orthopedics;  Laterality: Left;   JOINT REPLACEMENT     KNEE SURGERY     Patient Active Problem List   Diagnosis Date Noted   Ulcer of left lower leg, with necrosis of bone ([place])    Sepsis ([place])    Primary hypertension    Leukocytosis 11/17/2018   Osteomyelitis ([place]) 11/16/2018   HCAP (healthcare-associated pneumonia) 06/28/2013   Chest pain, atypical 06/28/2013   SIRS (systemic inflammatory response syndrome) ([place]) 06/28/2013   Hyponatremia 06/28/2013   Headache 01/05/2013   Leg pain 01/05/2013   Dizziness 01/05/2013   ALCOHOL ABUSE, IN REMISSION 06/10/2007   CIGARETTE SMOKER 06/10/2007   DEPRESSION 06/10/2007    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   HEMIPLEGIA, SPASTIC, NONDOMINANT SIDE 06/10/2007   POSTTRAUMATIC WOUND INFECTION NEC 06/10/2007    REFERRING DIAG: Z86.61 (ICD-10-CM) - Personal history of infections of the central nervous system G81.10 (ICD-10-CM) - Spastic hemiplegia affecting unspecified side Z86.73 (ICD-10-CM) - History of CVA (cerebrovascular accident  THERAPY DIAG:  Hemiplegia and hemiparesis following cerebral infarction affecting left non-dominant side ([facility])  Other symptoms and signs involving the musculoskeletal system  Other lack of coordination  Rationale for Evaluation and Treatment Rehabilitation  PERTINENT HISTORY: PMH: CVA, encephalitis, HTN   Pt reports hx of 14 surgeries on L knee   History of encephalitis and meningitis at the age of 23 resulting in CVA with L spastic hemiplegia     PRECAUTIONS: Fall   Wears an ankle brace on R side.     SUBJECTIVE: No falls.  He reports he has been using the cane in his home.  He is having a little knee pain today.  He reports he has a left knee brace on today.  PAIN:  Are you having pain? Yes: NPRS scale: 4/10 Pain location: Left knee Pain description: arthritis Aggravating factors: unsure Relieving factors: sleep and rest  TODAY'S TREATMENT:   STRENGTHENING:  SciFit with BLE/BUE x4 mins dropped to BLE only x4 mins at gear Level 5.0  for strengthening, ROM, activity tolerance.  Pt exhibits full bilateral knee extension ROM throughout.  Step goal of 80 steps/min w/ total steps achieved ...   With 6" step; x10 reps each leg forward step ups with single UE support, then performed x10 reps step ups with contralateral march with single UE support, cues for posture and to keep a soft bend in LLE as pt tends to have hyperextension.  10 reps bridging with 5 second hold for posterior chain/hip extensor strengthening, added to HEP 10 reps mini squats with 5 second hold in mini squat position and then cues for hip extension  when coming back to upright. Pt reporting feeling good with these.   BALANCE/NMR: On level ground; alternating toe taps to 6" step x10 reps each leg, then progressed to standing on air ex x10 reps each side. Pt needing tactile and verbal cues to keep a soft bend in LLE, pt with incr knee hyperextension with SLS tasks.  Then performed on air ex with 2 cones alternating forward taps to each with pt needing to use UE support x8 reps each side.    GAIT: Gait pattern: step through pattern, decreased step length- Right, decreased step length- Left, decreased stride length, knee flexed in stance- Right, knee flexed in stance- Left, trunk flexed, and narrow BOS Distance walked: around clinic with session Assistive device utilized: Single point cane Level of assistance: supervision/min guard assist Comments: Pt with good sequencing noted with use of cane.    PATIENT EDUCATION: Education details: Added bridging to HEP for hip extension strengthening.  Person educated: Patient Education method: Explanation, Demonstration, Verbal cues, Handouts Education comprehension: verbalized understanding, return demonstration, needs reinforcement     HOME EXERCISE PROGRAM: Access Code: [email or web address] URL: [email or web address] Date: 08/09/2021 Prepared by: Willow Ora  Exercises - Seated Hamstring Stretch  - 1 x daily - 5 x weekly - 1 sets - 3 reps - 30 seconds hold - Sit to Stand with Arms Crossed  - 1 x daily - 5 x weekly - 1 sets - 10 reps - Heel Raises with Counter Support  - 1 x daily - 5 x weekly - 1 sets - 10 reps - Seated Knee Extension with Resistance  - 1 x daily - 5 x weekly - 1 sets - 10 reps  -Bridges   GOALS: Goals reviewed with patient? Yes   SHORT TERM GOALS: Target date: 08/27/2021      Pt will be independent with initial HEP in order to build upon functional gains made in therapy.   Baseline: pt not doing anything for exercise at home.  Goal status: INITIAL   2.  Pt will  undergo further assessment of BERG with LTG written.  Baseline: 08/09/21: 43/56 scored as baseline  Goal status: MET   3.  Pt will ambulate at least 60' with LRAD with supervision over level indoor surfaces in order to demo improved safety with gait at home due to fall risk.  Baseline: pt ambulating with no AD at home.  Goal status: INITIAL       LONG TERM GOALS: Target date: 09/24/2021     Pt will be independent with final HEP in order to build upon functional gains made in therapy. Baseline: pt not doing anything for exercise at home.  Goal status: INITIAL   2.  Pt will improve BERG to at least a 47/56 in order to demo decr fall risk.  Baseline: 43/56 Goal status: INITIAL   3.  Pt will decr TUG  with no AD to 13.5 seconds or less with supervision in order to demo decr fall risk.  Baseline: 16.1 seconds with no AD, min guard when turning.  Goal status: INITIAL   4.  Pt will go up and down 12 steps with single vs. Bilat handrail with alternating pattern in order to safely get in and out of his apartment  Baseline: needs cues for proper sequencing.  Goal status: INITIAL   5.  Pt will perform at least 13 sit <> stands with no UE support in 30 seconds in order to demo improved functional strength/endurance.  Baseline: 11 sit <> stands w/ no UE support  Goal status: INITIAL     ASSESSMENT:   CLINICAL IMPRESSION: Today's skilled session focused on BLE strengthening and SLS tasks. Pt tolerated session well, did need cues to keep a soft bend in his LLE due to incr knee hyperextension during SLS tasks. Added bridging to pt's HEP for posterior chain strengthening. Will continue to progress towards LTGs.        OBJECTIVE IMPAIRMENTS Abnormal gait, decreased activity tolerance, decreased balance, decreased coordination, decreased endurance, decreased knowledge of use of DME, difficulty walking, decreased ROM, decreased strength, decreased safety awareness, increased muscle spasms,  impaired flexibility, impaired tone, impaired UE functional use, and postural dysfunction.    ACTIVITY LIMITATIONS cleaning and community activity.    PERSONAL FACTORS Age, Behavior pattern, Past/current experiences, Time since onset of injury/illness/exacerbation, and 3+ comorbidities: CVA, encephalitis, HTN  are also affecting patient's functional outcome.      REHAB POTENTIAL: Fair due to chronicity of condition    CLINICAL DECISION MAKING: Evolving/moderate complexity   EVALUATION COMPLEXITY: Moderate   PLAN: PT FREQUENCY: 1x/week   PT DURATION: 12 weeks   PLANNED INTERVENTIONS: Therapeutic exercises, Therapeutic activity, Neuromuscular re-education, Balance training, Gait training, Patient/Family education, Stair training, Vestibular training, DME instructions, and Manual therapy   PLAN FOR NEXT SESSION: Assess STGs - due 08/27/2021.  Add additional stretches and balance ex's to HEP as needed. Continue to work on gait training with cane. Standing balance and functional strengthening. Work on [employer]. SciFit for ROM/aerobic activity.   Elease Etienne, PT, DPT  08/21/21, 2:01 PM

## 2021-08-20 NOTE — Therapy (Signed)
OUTPATIENT PHYSICAL THERAPY TREATMENT NOTE   Patient Name: [name] MRN: [id number] DOB:06-19-1980, 41 y.o., male Today's Date: 08/21/2021  PCP: Bartholome Bill, MD  REFERRING PROVIDER: Bartholome Bill, MD   END OF SESSION:   PT End of Session - 08/20/21 1450     Visit Number 4    Number of Visits 9    Date for PT Re-Evaluation 10/28/21   due to potential delay in scheduling   Authorization Type Medicaid - 3 PT visits approved 08/09/21 - 08/29/21; resubmitted auth for remaining visits on 08/21/2021    Authorization - Visit Number 3    Authorization - Number of Visits 3    PT Start Time 2334    PT Stop Time 1530    PT Time Calculation (min) 44 min    Equipment Utilized During Treatment Gait belt    Activity Tolerance Patient tolerated treatment well    Behavior During Therapy WFL for tasks assessed/performed             Past Medical History:  Diagnosis Date   Depression    Encephalitis    Hypertension    Meningitis    Paralysis, 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   Stroke [facility])    Past Surgical History:  Procedure Laterality Date   I & D EXTREMITY Left 01/12/2020   Procedure: EXCISION DEBRIDEMENT LEFT TIBIAL TUBERCLE, PLACE ANTIBIOTIC BEADS;  Surgeon: Newt Minion, MD;  Location: [place];  Service: Orthopedics;  Laterality: Left;   JOINT REPLACEMENT     KNEE SURGERY     Patient Active Problem List   Diagnosis Date Noted   Ulcer of left lower leg, with necrosis of bone ([place])    Sepsis ([place])    Primary hypertension    Leukocytosis 11/17/2018   Osteomyelitis ([place]) 11/16/2018   HCAP (healthcare-associated pneumonia) 06/28/2013   Chest pain, atypical 06/28/2013   SIRS (systemic inflammatory response syndrome) ([place]) 06/28/2013   Hyponatremia 06/28/2013   Headache 01/05/2013   Leg pain 01/05/2013   Dizziness 01/05/2013   ALCOHOL ABUSE, IN REMISSION 06/10/2007   CIGARETTE SMOKER 06/10/2007   DEPRESSION 06/10/2007    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   HEMIPLEGIA, SPASTIC, NONDOMINANT SIDE 06/10/2007   POSTTRAUMATIC WOUND INFECTION NEC 06/10/2007    REFERRING DIAG: Z86.61 (ICD-10-CM) - Personal history of infections of the central nervous system G81.10 (ICD-10-CM) - Spastic hemiplegia affecting unspecified side Z86.73 (ICD-10-CM) - History of CVA (cerebrovascular accident  THERAPY DIAG:  Hemiplegia and hemiparesis following cerebral infarction affecting left non-dominant side (HCC)  Other symptoms and signs involving the musculoskeletal system  Other lack of coordination  Rationale for Evaluation and Treatment Rehabilitation  PERTINENT HISTORY: PMH: CVA, encephalitis, HTN   Pt reports hx of 14 surgeries on L knee   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   SUBJECTIVE: No falls.  He reports he has been using the cane in his home.  He is having a little knee pain today.  He reports he has a left knee brace on today.  He has been walking in neighborhood without issue using cane, but he has mild difficulty with concrete surfaces.  PAIN:  Are you having pain? Yes: NPRS scale: 4/10 Pain location: Left knee Pain description: arthritis Aggravating factors: unsure Relieving factors: sleep and rest  TODAY'S TREATMENT:   STRENGTHENING:  SciFit with BLE/BUE x4 mins dropped to BLE only x4 mins at gear Level 5.0 for strengthening, ROM, activity tolerance.  Pt exhibits full bilateral knee extension ROM throughout.  Step goal of 80 steps/min w/ total steps achieved 592.  BALANCE/NMR: -STS w/ RLE elevated on 2">4" step 4x8 using mirror for postural feedback at knees to prevent excessive valgus -Step to dots progressing from single dot to 3 dots for added dual task w/ LLE in stance requiring CGA until last few reps of SBA as pt used SPC on right side; able to perform crossover step w/o  LOB  GAIT: Gait pattern: step through pattern, decreased arm swing- Left, decreased step length- Right, decreased step length- Left, knee flexed in stance- Right, knee flexed in stance- Left, genu recurvatum- Left, lateral lean- Right, decreased trunk rotation, trunk flexed, and narrow BOS Distance walked: 345' Assistive device utilized: Single point cane Level of assistance: SBA and CGA Comments: Pt ambulates w/ cane on right w/ one instance of CGA when turning demonstrating a crossover step.  He continues to have good sequencing with SPC.  PATIENT EDUCATION: Education details: Continue HEP.  Person educated: Patient Education method: Explanation, Demonstration, Verbal cues, Handouts Education comprehension: verbalized understanding, return demonstration, needs reinforcement     HOME EXERCISE PROGRAM: Access Code: [email or web address] URL: [email or web address] Date: 08/09/2021 Prepared by: Willow Ora  Exercises - Seated Hamstring Stretch  - 1 x daily - 5 x weekly - 1 sets - 3 reps - 30 seconds hold - Sit to Stand with Arms Crossed  - 1 x daily - 5 x weekly - 1 sets - 10 reps - Heel Raises with Counter Support  - 1 x daily - 5 x weekly - 1 sets - 10 reps - Seated Knee Extension with Resistance  - 1 x daily - 5 x weekly - 1 sets - 10 reps  -Bridges   GOALS: Goals reviewed with patient? Yes   SHORT TERM GOALS: Target date: 08/27/2021      Pt will be independent with initial HEP in order to build upon functional gains made in therapy.   Baseline: pt not doing anything for exercise at home.  Goal status: INITIAL   2.  Pt will undergo further assessment of BERG with LTG written.  Baseline: 08/09/21: 43/56 scored as baseline  Goal status: MET   3.  Pt will ambulate at least 27' with LRAD with supervision over level indoor surfaces in order to demo improved safety with gait at home due to fall risk.  Baseline: pt ambulating with no AD at home.  Goal status: INITIAL        LONG TERM GOALS: Target date: 09/24/2021     Pt will be independent with final HEP in order to build upon functional gains made in therapy. Baseline: pt not doing anything for exercise at home.  Goal status: INITIAL   2.  Pt will improve BERG to at least a 47/56 in order to demo decr fall risk.  Baseline: 43/56 Goal status: INITIAL   3.  Pt will decr TUG with no AD to 13.5 seconds or less with supervision in order to demo decr fall risk.  Baseline: 16.1 seconds with no AD, min guard when turning.  Goal status: INITIAL   4.  Pt will go up and down 12 steps with single vs. Bilat handrail with alternating pattern in order to safely get in and out of his apartment  Baseline: needs cues for proper sequencing.  Goal status: INITIAL  5.  Pt will perform at least 13 sit <> stands with no UE support in 30 seconds in order to demo improved functional strength/endurance.  Baseline: 11 sit <> stands w/ no UE support  Goal status: INITIAL     ASSESSMENT:   CLINICAL IMPRESSION: Focus of skilled session today on progression of gait training with SPC and NMR w/ and w/o UE support.  Pt demonstrates good ability to maintain SLS w/ increased knee flexion in stance noted during NMR and gait.  He has intermittent genu recurvatum of the LLE in terminal stance and when unlocking left knee for SLS activities.  Will continue to progress towards long-term goals per POC.       OBJECTIVE IMPAIRMENTS Abnormal gait, decreased activity tolerance, decreased balance, decreased coordination, decreased endurance, decreased knowledge of use of DME, difficulty walking, decreased ROM, decreased strength, decreased safety awareness, increased muscle spasms, impaired flexibility, impaired tone, impaired UE functional use, and postural dysfunction.    ACTIVITY LIMITATIONS cleaning and community activity.    PERSONAL FACTORS Age, Behavior pattern, Past/current experiences, Time since onset of injury/illness/exacerbation,  and 3+ comorbidities: CVA, encephalitis, HTN  are also affecting patient's functional outcome.      REHAB POTENTIAL: Fair due to chronicity of condition    CLINICAL DECISION MAKING: Evolving/moderate complexity   EVALUATION COMPLEXITY: Moderate   PLAN: PT FREQUENCY: 1x/week   PT DURATION: 12 weeks   PLANNED INTERVENTIONS: Therapeutic exercises, Therapeutic activity, Neuromuscular re-education, Balance training, Gait training, Patient/Family education, Stair training, Vestibular training, DME instructions, and Manual therapy   PLAN FOR NEXT SESSION: Assess STGs - due 08/27/2021.  Add additional stretches and balance ex's to HEP as needed. Continue to work on gait training with cane. Standing balance and functional strengthening. Work on [employer]. SciFit for ROM/aerobic activity.   Elease Etienne, PT, DPT  08/21/21, 2:00 PM

## 2021-08-27 ENCOUNTER — Ambulatory Visit: Payer: Medicaid Other | Admitting: Physical Therapy

## 2021-08-27 ENCOUNTER — Encounter: Payer: Self-pay | Admitting: Physical Therapy

## 2021-08-27 DIAGNOSIS — R29898 Other symptoms and signs involving the musculoskeletal system: Secondary | ICD-10-CM

## 2021-08-27 DIAGNOSIS — R2681 Unsteadiness on feet: Secondary | ICD-10-CM

## 2021-08-27 DIAGNOSIS — I69354 Hemiplegia and hemiparesis following cerebral infarction affecting left non-dominant side: Secondary | ICD-10-CM

## 2021-08-27 DIAGNOSIS — R278 Other lack of coordination: Secondary | ICD-10-CM

## 2021-08-27 NOTE — Therapy (Addendum)
OUTPATIENT PHYSICAL THERAPY TREATMENT NOTE   Patient Name: [name] MRN: [id number] DOB:Oct 30, 1980, 41 y.o., male Today's Date: 08/27/2021  PCP: Bartholome Bill, MD  REFERRING PROVIDER: Bartholome Bill, MD   END OF SESSION:   PT End of Session - 08/27/21 1449     Visit Number 5    Number of Visits 9    Date for PT Re-Evaluation 10/28/21   due to potential delay in scheduling   Authorization Type Medicaid - 3 PT visits approved 08/09/21 - 08/29/21; resubmitted auth for remaining visits on 08/21/2021    Authorization - Visit Number 3    Authorization - Number of Visits 3    PT Start Time 1448    PT Stop Time 1528    PT Time Calculation (min) 40 min    Equipment Utilized During Treatment Gait belt    Activity Tolerance Patient tolerated treatment well    Behavior During Therapy WFL for tasks assessed/performed             Past Medical History:  Diagnosis Date   Depression    Encephalitis    Hypertension    Meningitis    Paralysis, 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   Stroke [facility])    Past Surgical History:  Procedure Laterality Date   I & D EXTREMITY Left 01/12/2020   Procedure: EXCISION DEBRIDEMENT LEFT TIBIAL TUBERCLE, PLACE ANTIBIOTIC BEADS;  Surgeon: Newt Minion, MD;  Location: [place];  Service: Orthopedics;  Laterality: Left;   JOINT REPLACEMENT     KNEE SURGERY     Patient Active Problem List   Diagnosis Date Noted   Ulcer of left lower leg, with necrosis of bone ([place])    Sepsis ([place])    Primary hypertension    Leukocytosis 11/17/2018   Osteomyelitis ([place]) 11/16/2018   HCAP (healthcare-associated pneumonia) 06/28/2013   Chest pain, atypical 06/28/2013   SIRS (systemic inflammatory response syndrome) ([place]) 06/28/2013   Hyponatremia 06/28/2013   Headache 01/05/2013   Leg pain 01/05/2013   Dizziness 01/05/2013   ALCOHOL ABUSE, IN REMISSION 06/10/2007   CIGARETTE SMOKER 06/10/2007   DEPRESSION  06/10/2007   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   HEMIPLEGIA, SPASTIC, NONDOMINANT SIDE 06/10/2007   POSTTRAUMATIC WOUND INFECTION NEC 06/10/2007    REFERRING DIAG: Z86.61 (ICD-10-CM) - Personal history of infections of the central nervous system G81.10 (ICD-10-CM) - Spastic hemiplegia affecting unspecified side Z86.73 (ICD-10-CM) - History of CVA (cerebrovascular accident  THERAPY DIAG:  Hemiplegia and hemiparesis following cerebral infarction affecting left non-dominant side (HCC)  Other symptoms and signs involving the musculoskeletal system  Other lack of coordination  Unsteadiness on feet  Rationale for Evaluation and Treatment Rehabilitation  PERTINENT HISTORY: PMH: CVA, encephalitis, HTN   Pt reports hx of 14 surgeries on L knee   History of encephalitis and meningitis at the age of 83 resulting in CVA with L spastic hemiplegia   PRECAUTIONS: Fall   Wears an ankle brace on R side.   SUBJECTIVE: Had bad insomnia last night - got no sleep. Had one fall - was brushing his teeth and turned too quickly, was able to get up on his own. Exercises are going lovely at home.   PAIN:  Are you having pain? No  TODAY'S TREATMENT:   STRENGTHENING:  Sit to stands with blue tband around thighs x10 reps with verbal/tactile cues for hip ABD activation when sitting and standing, performed without UE support Clamshells in sidelying 2 sets of  10 reps each side; 1st set without resistance, 2nd set with red tband, cued for proper technique and positioning With BUE support on kaye bench; 2 sets of 10 reps tall kneel mini squats for strengthening/ROM, cues for proper technique and to decr stress on low back by keeping chest up, and then cues for posture and bringing belly button forwards to the bench for additional hip flexion stretch.   BALANCE/NMR: On blue air ex with feet apart: EO x10 reps head turns, x10 reps head nods- cues to try to keep a soft knee in LLE as pt has incr knee  hyperextension, cues to just move head vs. Whole body. EC 2 x 30 seconds static balance, min guard for balance.  Looking behind shoulder and holding it for trunk rotation/weight shifting x5 reps each side, pt able to maintain balance well.   GAIT: Gait pattern: step through pattern, decreased arm swing- Left, decreased step length- Right, decreased step length- Left, knee flexed in stance- Right, knee flexed in stance- Left, genu recurvatum- Left, lateral lean- Right, decreased trunk rotation, trunk flexed, and narrow BOS Distance walked: Clinic distances  Assistive device utilized: Single point cane Level of assistance: SBA and CGA Comments: Pt ambulating with SPC in and out of session.   PATIENT EDUCATION: Education details: Continue with HEP Person educated: Patient Education method: Explanation Education comprehension: verbalized understanding    HOME EXERCISE PROGRAM: Access Code: [email or web address] URL: [email or web address] Date: 08/09/2021 Prepared by: Willow Ora  Exercises - Seated Hamstring Stretch  - 1 x daily - 5 x weekly - 1 sets - 3 reps - 30 seconds hold - Sit to Stand with Arms Crossed  - 1 x daily - 5 x weekly - 1 sets - 10 reps - Heel Raises with Counter Support  - 1 x daily - 5 x weekly - 1 sets - 10 reps - Seated Knee Extension with Resistance  - 1 x daily - 5 x weekly - 1 sets - 10 reps  -Bridges   GOALS: Goals reviewed with patient? Yes   SHORT TERM GOALS: Target date: 08/27/2021      Pt will be independent with initial HEP in order to build upon functional gains made in therapy.   Baseline: pt doing his exercises at home.  Goal status: MET   2.  Pt will undergo further assessment of BERG with LTG written.  Baseline: 08/09/21: 43/56 scored as baseline  Goal status: MET   3.  Pt will ambulate at least 28' with LRAD with supervision over level indoor surfaces in order to demo improved safety with gait at home due to fall risk.  Baseline: pt  ambulating with no AD at home.  Goal status: INITIAL       LONG TERM GOALS: Target date: 09/24/2021     Pt will be independent with final HEP in order to build upon functional gains made in therapy. Baseline: pt not doing anything for exercise at home.  Goal status: INITIAL   2.  Pt will improve BERG to at least a 47/56 in order to demo decr fall risk.  Baseline: 43/56 Goal status: INITIAL   3.  Pt will decr TUG with no AD to 13.5 seconds or less with supervision in order to demo decr fall risk.  Baseline: 16.1 seconds with no AD, min guard when turning.  Goal status: INITIAL   4.  Pt will go up and down 12 steps with single vs. Bilat handrail with alternating pattern in order to  safely get in and out of his apartment  Baseline: needs cues for proper sequencing.  Goal status: INITIAL   5.  Pt will perform at least 13 sit <> stands with no UE support in 30 seconds in order to demo improved functional strength/endurance.  Baseline: 11 sit <> stands w/ no UE support  Goal status: INITIAL     ASSESSMENT:   CLINICAL IMPRESSION: Today's skilled session focused on BLE strengthening and standing balance on unlevel surfaces. Worked out hip ABD strengthening with sit <> stands and clamshells. Pt needing cues for hip ABD with blue tband during sit <> Stands as pt tends to perform with incr hip ADD. Pt tolerated session well, will continue to progress towards LTGs.        OBJECTIVE IMPAIRMENTS Abnormal gait, decreased activity tolerance, decreased balance, decreased coordination, decreased endurance, decreased knowledge of use of DME, difficulty walking, decreased ROM, decreased strength, decreased safety awareness, increased muscle spasms, impaired flexibility, impaired tone, impaired UE functional use, and postural dysfunction.    ACTIVITY LIMITATIONS cleaning and community activity.    PERSONAL FACTORS Age, Behavior pattern, Past/current experiences, Time since onset of  injury/illness/exacerbation, and 3+ comorbidities: CVA, encephalitis, HTN  are also affecting patient's functional outcome.      REHAB POTENTIAL: Fair due to chronicity of condition    CLINICAL DECISION MAKING: Evolving/moderate complexity   EVALUATION COMPLEXITY: Moderate   PLAN: PT FREQUENCY: 1x/week   PT DURATION: 12 weeks   PLANNED INTERVENTIONS: Therapeutic exercises, Therapeutic activity, Neuromuscular re-education, Balance training, Gait training, Patient/Family education, Stair training, Vestibular training, DME instructions, and Manual therapy   PLAN FOR NEXT SESSION: Assess STGs - due 08/27/2021 (forgot to check the gait one today)  Add additional stretches and balance ex's to HEP as needed. Continue to work on gait training with cane. Standing balance and functional strengthening. Work on [employer]. SciFit for ROM/aerobic activity.   Janann August, PT, DPT 08/27/21 3:32 PM    08/27/21, 3:32 PM

## 2021-09-04 ENCOUNTER — Ambulatory Visit: Payer: Medicaid Other | Admitting: Physical Therapy

## 2021-09-11 ENCOUNTER — Ambulatory Visit: Payer: Medicaid Other | Admitting: Physical Therapy

## 2021-09-20 ENCOUNTER — Ambulatory Visit: Payer: Medicaid Other | Attending: Family Medicine | Admitting: Physical Therapy

## 2021-09-20 ENCOUNTER — Encounter: Payer: Self-pay | Admitting: Physical Therapy

## 2021-09-20 DIAGNOSIS — I69354 Hemiplegia and hemiparesis following cerebral infarction affecting left non-dominant side: Secondary | ICD-10-CM | POA: Insufficient documentation

## 2021-09-20 DIAGNOSIS — R29898 Other symptoms and signs involving the musculoskeletal system: Secondary | ICD-10-CM | POA: Insufficient documentation

## 2021-09-20 DIAGNOSIS — R278 Other lack of coordination: Secondary | ICD-10-CM | POA: Insufficient documentation

## 2021-09-20 DIAGNOSIS — R293 Abnormal posture: Secondary | ICD-10-CM | POA: Diagnosis present

## 2021-09-20 DIAGNOSIS — R2689 Other abnormalities of gait and mobility: Secondary | ICD-10-CM | POA: Insufficient documentation

## 2021-09-20 DIAGNOSIS — R2681 Unsteadiness on feet: Secondary | ICD-10-CM | POA: Insufficient documentation

## 2021-09-20 NOTE — Therapy (Signed)
OUTPATIENT PHYSICAL THERAPY TREATMENT NOTE   Patient Name: [name] MRN: [id number] DOB:12/20/1980, 41 y.o., male Today's Date: 09/20/2021  PCP: Bartholome Bill, MD  REFERRING PROVIDER: Bartholome Bill, MD   END OF SESSION:   PT End of Session - 09/20/21 1318     Visit Number 6    Number of Visits 9    Date for PT Re-Evaluation 10/28/21   due to potential delay in scheduling   Authorization Type Medicaid - 3 PT visits approved 08/09/21 - 08/29/21; resubmitted auth for remaining visits on 08/21/2021-approved for 5 PT visits 08/30/2021-10/03/2021.    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   Stroke [facility])    Past Surgical History:  Procedure Laterality Date   I & D EXTREMITY Left 01/12/2020   Procedure: EXCISION DEBRIDEMENT LEFT TIBIAL TUBERCLE, PLACE ANTIBIOTIC BEADS;  Surgeon: Newt Minion, MD;  Location: [place];  Service: Orthopedics;  Laterality: Left;   JOINT REPLACEMENT     KNEE SURGERY     Patient Active Problem List   Diagnosis Date Noted   Ulcer of left lower leg, with necrosis of bone ([place])    Sepsis ([place])    Primary hypertension    Leukocytosis 11/17/2018   Osteomyelitis ([place]) 11/16/2018   HCAP (healthcare-associated pneumonia) 06/28/2013   Chest pain, atypical 06/28/2013   SIRS (systemic inflammatory response syndrome) ([place]) 06/28/2013   Hyponatremia 06/28/2013   Headache 01/05/2013   Leg pain 01/05/2013   Dizziness 01/05/2013   ALCOHOL ABUSE, IN REMISSION 06/10/2007    CIGARETTE SMOKER 06/10/2007   DEPRESSION 06/10/2007   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   HEMIPLEGIA, SPASTIC, NONDOMINANT SIDE 06/10/2007   POSTTRAUMATIC WOUND INFECTION NEC 06/10/2007    REFERRING DIAG: Z86.61 (ICD-10-CM) - Personal history of infections of the central nervous system G81.10 (ICD-10-CM) - Spastic hemiplegia affecting unspecified side Z86.73 (ICD-10-CM) - History of CVA (cerebrovascular accident  THERAPY DIAG:  Hemiplegia and hemiparesis following cerebral infarction affecting left non-dominant side (HCC)  Other symptoms and signs involving the musculoskeletal system  Other lack of coordination  Unsteadiness on feet  Abnormal posture  Rationale for Evaluation and Treatment Rehabilitation  PERTINENT HISTORY: PMH: CVA, encephalitis, HTN   Pt reports hx of 14 surgeries on L knee   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   SUBJECTIVE: Insomnia has improved, he slept all night last night.  He has fallen once while carrying groceries into his friends apartment, the bag of canned goods hit his left knee and he went down slowly.  He got up off the concrete on his own.  He states he did not hurt himself and that he only went down onto his knee, but did not completely hit the ground. Pt wears soft back brace  today as he states it helps his posture when walking without his cane.  PAIN:  Are you having pain? No  TODAY'S TREATMENT: Time spent scheduling final appt and discussing D/C plan.  Pt in agreement to POC.  BALANCE/NMR: -STS 3x8 on progressively lowered mat table starting from 24" w/ feet on airex -Pt standing on airex at [facility] performing cross body reach w/ RUE to retrieve bean bags tossing at high and low target > added dual task component w/ red and yellow to one target and blue and green to another w/ pt demonstrating dec accuracy of task maintaining  balance  GAIT: Gait pattern: step through pattern, decreased arm swing- Left, decreased step length- Right, decreased step length- Left, knee flexed in stance- Right, knee flexed in stance- Left, genu recurvatum- Left, lateral lean- Right, decreased trunk rotation, trunk flexed, and narrow BOS Distance walked: Clinic distances  Assistive device utilized: Single point cane and None Level of assistance: SBA/supervision Comments: Pt ambulates at good speed w/ left lateral list and decreased left arm swing.  He maintains adequate stride length throughout w/ increased bilateral knee bend throughout swing and stance phases.  PATIENT EDUCATION: Education details: Continue with HEP. Person educated: Patient Education method: Explanation Education comprehension: verbalized understanding    HOME EXERCISE PROGRAM: Access Code: [email or web address] URL: [email or web address] Date: 08/09/2021 Prepared by: Willow Ora  Exercises - Seated Hamstring Stretch  - 1 x daily - 5 x weekly - 1 sets - 3 reps - 30 seconds hold - Sit to Stand with Arms Crossed  - 1 x daily - 5 x weekly - 1 sets - 10 reps - Heel Raises with Counter Support  - 1 x daily - 5 x weekly - 1 sets - 10 reps - Seated Knee Extension with Resistance  - 1 x daily - 5 x weekly - 1 sets - 10 reps  -Bridges   GOALS: Goals reviewed with patient? Yes   SHORT TERM GOALS: Target date: 08/27/2021      Pt will be independent with initial HEP in order to build upon functional gains made in therapy.   Baseline: pt doing his exercises at home.  Goal status: MET   2.  Pt will undergo further assessment of BERG with LTG written.  Baseline: 08/09/21: 43/56 scored as baseline  Goal status: MET   3.  Pt will ambulate at least 76' with LRAD with supervision over level indoor surfaces in order to demo improved safety with gait at home due to fall risk.  Baseline: pt ambulating with no AD at home. ; 71' no AD supervision Goal status: MET        LONG TERM GOALS: Target date: 10/03/2021     Pt will be independent with final HEP in order to build upon functional gains made in therapy. Baseline: pt not doing anything for exercise at home.  Goal status: INITIAL   2.  Pt will improve BERG to at least a 47/56 in order to demo decr fall risk.  Baseline: 43/56 Goal status: INITIAL   3.  Pt will decr TUG with no AD to 13.5 seconds or less with supervision in order to demo decr fall risk.  Baseline: 16.1 seconds with no AD, min guard when turning.  Goal status: INITIAL   4.  Pt will go up and down 12 steps with single vs. Bilat handrail with alternating pattern in order to safely get in and out of his apartment  Baseline: needs cues for proper sequencing.  Goal status: INITIAL   5.  Pt will perform at least 13 sit <> stands with no UE support in 30 seconds in order to demo improved functional strength/endurance.  Baseline: 11 sit <> stands w/ no UE support  Goal status: INITIAL     ASSESSMENT:   CLINICAL IMPRESSION:  Emphasis on standing balance and posture during session with progression of gait training without AD.  Pt demonstrates intermittent improved upright posture without additional support.  Will advance HEP next session to prepare for D/C.   OBJECTIVE IMPAIRMENTS Abnormal gait, decreased activity tolerance, decreased balance, decreased coordination, decreased endurance, decreased knowledge of use of DME, difficulty walking, decreased ROM, decreased strength, decreased safety awareness, increased muscle spasms, impaired flexibility, impaired tone, impaired UE functional use, and postural dysfunction.    ACTIVITY LIMITATIONS cleaning and community activity.    PERSONAL FACTORS Age, Behavior pattern, Past/current experiences, Time since onset of injury/illness/exacerbation, and 3+ comorbidities: CVA, encephalitis, HTN  are also affecting patient's functional outcome.      REHAB POTENTIAL: Fair due to chronicity of  condition    CLINICAL DECISION MAKING: Evolving/moderate complexity   EVALUATION COMPLEXITY: Moderate   PLAN: PT FREQUENCY: 1x/week   PT DURATION: 12 weeks   PLANNED INTERVENTIONS: Therapeutic exercises, Therapeutic activity, Neuromuscular re-education, Balance training, Gait training, Patient/Family education, Stair training, Vestibular training, DME instructions, and Manual therapy   PLAN FOR NEXT SESSION: Update/advance HEP for standing balance and strength. Add additional stretches and balance ex's to HEP as needed. Continue to work on gait training with cane. Standing balance and functional strengthening. Work on [employer]. [employer] for ROM/aerobic activity.   Elease Etienne, PT, DPT 09/20/21, 2:04 PM

## 2021-09-23 NOTE — Therapy (Signed)
OUTPATIENT OCCUPATIONAL THERAPY NEURO TREATMENT  Patient Name: [name] MRN: [id number] DOB:01/23/1981, 41 y.o., male Today's Date: 09/23/2021  PCP: Dr. 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   Stroke [facility])    Past Surgical History:  Procedure Laterality Date   I & D EXTREMITY Left 01/12/2020   Procedure: EXCISION DEBRIDEMENT LEFT TIBIAL TUBERCLE, PLACE ANTIBIOTIC BEADS;  Surgeon: Nadara Mustard, MD;  Location: [facility];  Service: Orthopedics;  Laterality: Left;   JOINT REPLACEMENT     KNEE SURGERY     Patient Active Problem List   Diagnosis Date Noted   Ulcer of left lower leg, with necrosis of bone (HCC)    Sepsis (HCC)    Primary hypertension    Leukocytosis 11/17/2018   Osteomyelitis (HCC) 11/16/2018   HCAP (healthcare-associated pneumonia) 06/28/2013   Chest pain, atypical 06/28/2013   SIRS (systemic inflammatory response syndrome) (HCC) 06/28/2013   Hyponatremia 06/28/2013   Headache 01/05/2013   Leg pain 01/05/2013   Dizziness 01/05/2013   ALCOHOL ABUSE, IN REMISSION 06/10/2007   CIGARETTE SMOKER 06/10/2007   DEPRESSION 06/10/2007   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   HEMIPLEGIA, SPASTIC, NONDOMINANT SIDE 06/10/2007   POSTTRAUMATIC WOUND INFECTION NEC 06/10/2007    ONSET DATE: 08/08/21 referral date  REFERRING DIAG: G81.10 (ICD-10-CM) - Spastic hemiplegia affecting unspecified side   THERAPY DIAG:  No diagnosis found.  Rationale for Evaluation and Treatment Rehabilitation  SUBJECTIVE:   SUBJECTIVE STATEMENT: *** Pt reports that he has not had any OT since initial illness/hospitalization    Pt accompanied by: self  PERTINENT HISTORY: Pt with history of encephalitis and meningitis at the age of 51 resulting in CVA with L spastic  hemiplegia.   PMH: CVA, encephalitis, HTN, depression, Pt reports hx of 14 surgeries on L knee    PRECAUTIONS: Fall  PAIN:  Are you having pain? No  PATIENT GOALS  strengthen L hand, be able to hold objects in place easier  OBJECTIVE:   HAND DOMINANCE: Right  ADLs: Transfers/ambulation related to ADLs:  mod I--see PT eval for details Eating: mod I, difficulty cutting food with fork/knife Grooming: mod I.  UB Dressing: difficulty with fasteners.  Pt fastened/unfastened 3 buttons in 34.75sec. LB Dressing: incr time/difficulty for tying Toileting: mod I Bathing: mod I with min difficulty Tub Shower transfers: mod I but pt reports significant difficulty with shower transfer.   Equipment:  bathtub/shower combo, no grab bars   IADLs: Shopping: mod I, gets groceries delivered by [facility]: mod I, difficulty picking up items from floor  Meal Prep: standing in kitchen is difficult, cutting food is difficult, difficulty carrying/lifting pots/pan  Community mobility: independent, drives more with the LUE Medication management: aide reminds pt to take medication Handwriting:  uses R hand  MOBILITY STATUS: Hx of falls and uses single point cane prn.  POSTURE COMMENTS:  rounded shoulders, flexed trunk , and weight shift left, noted decr trunk stability with resisted UE movement  FUNCTIONAL OUTCOME MEASURES: FOTO: n/a due to chronic CVA  UPPER EXTREMITY ROM:   LUE grossly WFL, but noted unable to fully extend fingers with wrist ext, and noted PIP flex at times with movement due to spasticity.   UPPER EXTREMITY MMT:   BUE strength proximal strength  grossly 5/5.    HAND FUNCTION: Grip strength: Right: 87.9 lbs; Left: 69.6 lbs, Lateral pinch: Right: 20 lbs, Left: 18 lbs, 3 point pinch: Right: 13 lbs, Left: 12 lbs, and Tip pinch: Right 20 lbs, Left: 21 lbs  COORDINATION: 9 Hole Peg test: Right: 30.94 sec; Left: 27.22 sec  SENSATION: Pt reports decr hot/cold  sensation    MUSCLE TONE: LUE: Mild and primarily in L hand and noted with coordination tasks/positioning of hand/fingers  COGNITION: Overall cognitive status: Within functional limits for tasks assessed.  Noted dysarthria at times.    VISION: Subjective report: WFL per pt report   TODAY'S TREATMENT:  N/a   PATIENT EDUCATION: Education details: OT eval results, POC.  Recommend that pt look into requesting grab bars in shower from landlord (pt completed request online during eval) Person educated: Patient Education method: Explanation Education comprehension: verbalized understanding   HOME EXERCISE PROGRAM: Not yet issued    GOALS: Potential Goals reviewed with patient? Yes  SHORT TERM GOALS: Target date: 09/21/21  Pt will be independent with initial HEP for LUE. Baseline:  dependent, no formal HEP Goal status: INITIAL  2.  Pt will be independent with ways to decr tone in LUE (weightbearing, stretching, splinting prn). Baseline:  pt with min spasticity in LUE which affects functional use Goal status: INITIAL  3.  Pt will improve L grip strength by at least 4lbs to assist in stabilizing objects. Baseline:  69.6lbs Goal status: INITIAL     LONG TERM GOALS: Target date: 10/22/21  Pt will be independent with updated HEP for LUE. Baseline:  dependent, no formal HEP Goal status: INITIAL  2.  Pt will verbalize understanding of adaptive strategies for bilateral UE tasks and report incr ease with cutting food, fastening buttons, fastening zippers. Baseline:  pt reports difficulty with including cutting food, fastening buttons, fastening zippers Goal status: INITIAL  3.  Pt will verbalize understanding of adaptive strategies to incr safety/decr falls during IADLs.  Baseline:   pt reports hx of many falls, pt reports difficulty carrying pots/pans and difficulty with shower transfer. Goal status: INITIAL      ASSESSMENT:  CLINICAL IMPRESSION: *** Patient is a 41  y.o. male who was seen today for occupational therapy evaluation for L spastic hemiplegia.  Pt with history of encephalitis and meningitis at the age of 79 resulting in CVA with L spastic hemiplegia.  Pt reports that he has had no occupational therapy since initial illness/hospitalization.  PMH also includes:  HTN, depression, and pt reports hx of 14 surgeries on L knee.  Pt presents today with L spastic hemiparesis with decr coordination and LUE functional use, abnormal posture, and decr balance for ADLs/IADLs.  PERFORMANCE DEFICITS in functional skills including ADLs, IADLs, coordination, dexterity, sensation, tone, ROM, strength, flexibility, FMC, mobility, balance, body mechanics, decreased knowledge of use of DME, and UE functional use,  and psychosocial skills including habits and routines and behaviors.   IMPAIRMENTS are limiting patient from ADLs, IADLs, and leisure.   COMORBIDITIES may have co-morbidities  that affects occupational performance. Patient will benefit from skilled OT to address above impairments and improve overall function.  MODIFICATION OR ASSISTANCE TO COMPLETE EVALUATION: Min-Moderate modification of tasks or assist with assess necessary to complete an evaluation.  OT OCCUPATIONAL PROFILE AND HISTORY: Detailed assessment: Review of records and additional review of physical, cognitive, psychosocial history related to current functional performance.  CLINICAL DECISION MAKING: Moderate - several treatment options, min-mod task modification necessary  REHAB POTENTIAL: Good  EVALUATION COMPLEXITY: Moderate   PLAN: OT FREQUENCY: 1x/week  OT DURATION: 8 weeks +evaluation  PLANNED INTERVENTIONS: self care/ADL training, therapeutic exercise, therapeutic activity, neuromuscular re-education, manual therapy, passive range of motion, balance training, functional mobility training, aquatic therapy, splinting, electrical stimulation, ultrasound, paraffin, fluidotherapy, moist  heat, cryotherapy, patient/family education, energy conservation, and DME and/or AE instructions  RECOMMENDED OTHER SERVICES: current with PT  CONSULTED AND AGREED WITH PLAN OF CARE: Patient  PLAN FOR NEXT SESSION: *** HEP for coordination and wt. Bearing  LUE   Jacki Couse, OT 09/23/2021, 10:18 AM  Willa Frater, OTR/L [facility] [address] [place], [place]  [address] [phone] phone [phone] 09/23/21 10:18 AM

## 2021-09-24 ENCOUNTER — Encounter: Payer: Self-pay | Admitting: Physical Therapy

## 2021-09-24 ENCOUNTER — Ambulatory Visit: Payer: Medicaid Other | Admitting: Physical Therapy

## 2021-09-24 ENCOUNTER — Ambulatory Visit: Payer: Medicaid Other | Admitting: Occupational Therapy

## 2021-09-24 ENCOUNTER — Encounter: Payer: Self-pay | Admitting: Occupational Therapy

## 2021-09-24 DIAGNOSIS — R278 Other lack of coordination: Secondary | ICD-10-CM

## 2021-09-24 DIAGNOSIS — R2681 Unsteadiness on feet: Secondary | ICD-10-CM

## 2021-09-24 DIAGNOSIS — R293 Abnormal posture: Secondary | ICD-10-CM

## 2021-09-24 DIAGNOSIS — I69354 Hemiplegia and hemiparesis following cerebral infarction affecting left non-dominant side: Secondary | ICD-10-CM | POA: Diagnosis not present

## 2021-09-24 DIAGNOSIS — R29898 Other symptoms and signs involving the musculoskeletal system: Secondary | ICD-10-CM

## 2021-09-24 DIAGNOSIS — R2689 Other abnormalities of gait and mobility: Secondary | ICD-10-CM

## 2021-09-24 NOTE — Patient Instructions (Addendum)
Weight Bearing (Standing)    Rest palms comfortably on table, gently lean to the side and forward over hand. Hold 10 seconds.  Keep elbow straight and can place fingers over book. Repeat 10 times. Do 2 sessions per day.     Coordination Activities  Perform the following activities for 20 minutes 1-2 times per day with left hand(s).  Rotate ball in fingertips (clockwise and counter-clockwise). Toss ball in air and catch with the left hand.  "Juggle 2 balls"  Pause after each toss. Flip cards 1 at a time. Deal cards with your thumb (Hold deck in hand and push card off top with thumb). Pick up coins and place in container or coin bank. Pick up coins and stack.

## 2021-09-24 NOTE — Therapy (Signed)
OUTPATIENT PHYSICAL THERAPY TREATMENT NOTE   Patient Name: [name] MRN: [id number] DOB:[birth year], 41 y.o., male Today's Date: 09/24/2021  PCP: Bartholome Bill, MD  REFERRING PROVIDER: Bartholome Bill, MD   END OF SESSION:   PT End of Session - 09/24/21 1324     Visit Number 7    Number of Visits 9    Date for PT Re-Evaluation 10/28/21   due to potential delay in scheduling   Authorization Type Medicaid - 3 PT visits approved 08/09/21 - 08/29/21; resubmitted auth for remaining visits on 08/21/2021-approved for 5 PT visits 08/30/2021-10/03/2021.    Authorization - Visit Number 2    Authorization - Number of Visits 5    PT Start Time 1607    PT Stop Time 1358   9 minutes not billed as pt in restroom.   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   Stroke [facility])    Past Surgical History:  Procedure Laterality Date   I & D EXTREMITY Left 01/12/2020   Procedure: EXCISION DEBRIDEMENT LEFT TIBIAL TUBERCLE, PLACE ANTIBIOTIC BEADS;  Surgeon: Newt Minion, MD;  Location: [place];  Service: Orthopedics;  Laterality: Left;   JOINT REPLACEMENT     KNEE SURGERY     Patient Active Problem List   Diagnosis Date Noted   Ulcer of left lower leg, with necrosis of bone ([place])    Sepsis ([place])    Primary hypertension    Leukocytosis 11/17/2018   Osteomyelitis ([place]) 11/16/2018   HCAP (healthcare-associated pneumonia) 06/28/2013   Chest pain, atypical 06/28/2013   SIRS (systemic inflammatory response syndrome) (HCC) 06/28/2013   Hyponatremia 06/28/2013   Headache 01/05/2013   Leg pain 01/05/2013   Dizziness 01/05/2013    ALCOHOL ABUSE, IN REMISSION 06/10/2007   CIGARETTE SMOKER 06/10/2007   DEPRESSION 06/10/2007   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   HEMIPLEGIA, SPASTIC, NONDOMINANT SIDE 06/10/2007   POSTTRAUMATIC WOUND INFECTION NEC 06/10/2007    REFERRING DIAG: Z86.61 (ICD-10-CM) - Personal history of infections of the central nervous system G81.10 (ICD-10-CM) - Spastic hemiplegia affecting unspecified side Z86.73 (ICD-10-CM) - History of CVA (cerebrovascular accident  THERAPY DIAG:  Hemiplegia and hemiparesis following cerebral infarction affecting left non-dominant side (HCC)  Other symptoms and signs involving the musculoskeletal system  Unsteadiness on feet  Other abnormalities of gait and mobility  Abnormal posture  Rationale for Evaluation and Treatment Rehabilitation  PERTINENT HISTORY: PMH: CVA, encephalitis, HTN   Pt reports hx of 14 surgeries on L knee   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   SUBJECTIVE: Woke up at 4am last night and could not go back to sleep so he is tired today.  Patient denies falls and near misses and he attributes this to using his cane more.  PAIN:  Are you having pain? No  TODAY'S TREATMENT: THEREX: -Seated hamstring stretch 2x30 sec; pt demos that he has been doing a standing hamstring stretch at  home, advised to do this seated due to postural deficits and likelihood of fall -STS w/ arms crossed x10 w/ upright trunk cues -Seated knee extension w/ resistance-green theraband x8 each LE -Supine bridges x10  PT ambulates w/ pt to restroom as pt states urgent need to use restroom modified independent w/ SPC on right.  PT accompanies pt to prevent rushing and promote safety only.  Pt requires no assist once in restroom.  Informed of gray button on wall if in need of assistance.   -Heel raises w/ counter support   -SLS w/ progressively dec UE  support to Right 4 finger support 3x30 seconds  -lateral stepping at counter progressed to no UE support 4x8'; min cues for correct posture to engage glut med; discussed progression at home with red theraband  PATIENT EDUCATION: Education details: Continue with HEP. Person educated: Patient Education method: Explanation Education comprehension: verbalized understanding    HOME EXERCISE PROGRAM: Access Code: [email or web address] URL: [email or web address] Date: 08/09/2021 Prepared by: Willow Ora  Exercises - Seated Hamstring Stretch  - 1 x daily - 5 x weekly - 1 sets - 3 reps - 30 seconds hold - Sit to Stand with Arms Crossed  - 1 x daily - 5 x weekly - 1 sets - 10 reps - Heel Raises with Counter Support  - 1 x daily - 5 x weekly - 1 sets - 10 reps - Seated Knee Extension with Resistance  - 1 x daily - 5 x weekly - 1 sets - 10 reps  -Bridges   - Side Stepping with Counter Support  - 1 x daily - 5 x weekly - 3 sets - 10 reps - Side Stepping with Resistance at Thighs and Counter Support  - 1 x daily - 5 x weekly - 3 sets - 10 reps - Standing Single Leg Stance with Counter Support  - 1 x daily - 5 x weekly - 1 sets - 3 reps - 30 seconds hold  GOALS: Goals reviewed with patient? Yes   SHORT TERM GOALS: Target date: 08/27/2021      Pt will be independent with initial HEP in order to build upon functional gains made in therapy.   Baseline: pt doing his exercises at home.  Goal status: MET   2.  Pt will undergo further assessment of BERG with LTG written.  Baseline: 08/09/21: 43/56 scored as baseline  Goal status: MET   3.  Pt will ambulate at least 36' with LRAD with supervision over level indoor surfaces in order to demo improved safety with gait at home due to fall risk.  Baseline: pt ambulating with no AD at home. ; 46' no AD supervision Goal status: MET       LONG TERM GOALS: Target date: 10/03/2021     Pt will be independent with final HEP in order to build upon  functional gains made in therapy. Baseline: pt not doing anything for exercise at home.  Goal status: INITIAL   2.  Pt will improve BERG to at least a 47/56 in order to demo decr fall risk.  Baseline: 43/56 Goal status: INITIAL   3.  Pt will decr TUG with no AD to 13.5 seconds or less with supervision in order to demo decr fall risk.  Baseline: 16.1 seconds with no AD, min guard when turning.  Goal status: INITIAL   4.  Pt will go up and down 12 steps with single vs. Bilat handrail with alternating pattern in order to safely get  in and out of his apartment  Baseline: needs cues for proper sequencing.  Goal status: INITIAL   5.  Pt will perform at least 13 sit <> stands with no UE support in 30 seconds in order to demo improved functional strength/endurance.  Baseline: 11 sit <> stands w/ no UE support  Goal status: INITIAL     ASSESSMENT:   CLINICAL IMPRESSION:  Focus of skilled session on reviewing and updating HEP to reflect current functional level and advance physical challenge.  Pt continues to be challenged by postural deficits preventing consistent upright form during standing balance and activities, but demonstrates good balance control with persistent left lateral list in standing.  He is in agreement to review LTGs and discharge next visit.   OBJECTIVE IMPAIRMENTS Abnormal gait, decreased activity tolerance, decreased balance, decreased coordination, decreased endurance, decreased knowledge of use of DME, difficulty walking, decreased ROM, decreased strength, decreased safety awareness, increased muscle spasms, impaired flexibility, impaired tone, impaired UE functional use, and postural dysfunction.    ACTIVITY LIMITATIONS cleaning and community activity.    PERSONAL FACTORS Age, Behavior pattern, Past/current experiences, Time since onset of injury/illness/exacerbation, and 3+ comorbidities: CVA, encephalitis, HTN  are also affecting patient's functional outcome.       REHAB POTENTIAL: Fair due to chronicity of condition    CLINICAL DECISION MAKING: Evolving/moderate complexity   EVALUATION COMPLEXITY: Moderate   PLAN: PT FREQUENCY: 1x/week   PT DURATION: 12 weeks   PLANNED INTERVENTIONS: Therapeutic exercises, Therapeutic activity, Neuromuscular re-education, Balance training, Gait training, Patient/Family education, Stair training, Vestibular training, DME instructions, and Manual therapy   PLAN FOR NEXT SESSION: Assess LTGs and D/C!  Update/advance HEP for standing balance and strength. Add additional stretches and balance ex's to HEP as needed. Continue to work on gait training with cane. Standing balance and functional strengthening. Work on [employer]. [employer] for ROM/aerobic activity.   Elease Etienne, PT, DPT 09/24/21, 2:04 PM

## 2021-09-27 NOTE — Therapy (Signed)
OUTPATIENT OCCUPATIONAL THERAPY NEURO TREATMENT  Patient Name: [name] MRN: [id number] DOB:[birth year], 41 y.o., male Today's Date: 10/01/2021  PCP: Dr. Babette Relic Eartha Inch REFERRING PROVIDER: Verlon Au, MD    OT End of Session - 10/01/21 1237     Visit Number 3    Number of Visits 9    Date for OT Re-Evaluation 10/22/21    Authorization Type Medicaid [place]--approved for 7 OT visits 7/10-11/18/21    Authorization - Visit Number 2    Authorization - Number of Visits 7    OT Start Time 1235    OT Stop Time 1315    OT Time Calculation (min) 40 min    Activity Tolerance Patient tolerated treatment well    Behavior During Therapy WFL for tasks assessed/performed               Past Medical History:  Diagnosis Date   Depression    Encephalitis    Hypertension    Meningitis    Paralysis, 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   Stroke [facility])    Past Surgical History:  Procedure Laterality Date   I & D EXTREMITY Left 01/12/2020   Procedure: EXCISION DEBRIDEMENT LEFT TIBIAL TUBERCLE, PLACE ANTIBIOTIC BEADS;  Surgeon: Nadara Mustard, MD;  Location: [facility];  Service: Orthopedics;  Laterality: Left;   JOINT REPLACEMENT     KNEE SURGERY     Patient Active Problem List   Diagnosis Date Noted   Ulcer of left lower leg, with necrosis of bone (HCC)    Sepsis (HCC)    Primary hypertension    Leukocytosis 11/17/2018   Osteomyelitis (HCC) 11/16/2018   HCAP (healthcare-associated pneumonia) 06/28/2013   Chest pain, atypical 06/28/2013   SIRS (systemic inflammatory response syndrome) (HCC) 06/28/2013   Hyponatremia 06/28/2013   Headache 01/05/2013   Leg pain 01/05/2013   Dizziness 01/05/2013   ALCOHOL ABUSE, IN REMISSION 06/10/2007   CIGARETTE SMOKER 06/10/2007   DEPRESSION 06/10/2007   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   HEMIPLEGIA, SPASTIC, NONDOMINANT SIDE 06/10/2007   POSTTRAUMATIC WOUND INFECTION NEC 06/10/2007    ONSET DATE:  08/08/21 referral date  REFERRING DIAG: G81.10 (ICD-10-CM) - Spastic hemiplegia affecting unspecified side   THERAPY DIAG:  Hemiplegia and hemiparesis following cerebral infarction affecting left non-dominant side (HCC)  Other symptoms and signs involving the musculoskeletal system  Other lack of coordination  Unsteadiness on feet  Abnormal posture  Rationale for Evaluation and Treatment Rehabilitation  SUBJECTIVE:   SUBJECTIVE STATEMENT: Pt reports that fall yesterday with blood sugar low--no injuries and able to get back up.  Pt reports that buttoning is improved.   Pt accompanied by: self  PERTINENT HISTORY:  Pt with history of encephalitis and meningitis at the age of 1 resulting in CVA with L spastic hemiplegia.   PMH: CVA, encephalitis, HTN, depression, Pt reports hx of 14 surgeries on L knee    PRECAUTIONS: Fall  PAIN:  Are you having pain? No  besides arthritis which comes and goes  PATIENT GOALS  strengthen L hand, be able to hold objects in place easier  OBJECTIVE:   HAND DOMINANCE: Right  TODAY'S TREATMENT:   Dealing cards with thumb with mod difficulty/cues and flipping cards with focus on full finger extension with min difficulty/cues for incr coordination.  Reviewed importance of avoiding MP hyperext/clawing when stretching L hand or wt. Bearing.  Pt verbalized understanding.     PATIENT EDUCATION: Education details: AE/strategies for ADLs including:  strategies for sliding pots/pans and draining water from pot (for improved safety and decr fall risk), use of button hook (and practiced with good success), choppers, various rocker knives, kitchen scissors  Person educated: Patient Education method: Explanation and shown pictures and where to purchase  (pt added items to [employer] on phone as desired) Education comprehension: verbalized understanding   HOME EXERCISE PROGRAM: 09/24/21:  Initial HEP 10/01/21:  AE/strategies for ADLs including:   strategies for sliding pots/pans and draining water from pot (for improved safety and decr fall risk), use of button hook (and practiced with good success), choppers, various rocker knives, kitchen scissors     GOALS: Potential Goals reviewed with patient? Yes  SHORT TERM GOALS: Target date: 09/21/21  Pt will be independent with initial HEP for LUE. Baseline:  dependent, no formal HEP Goal status: INITIAL  2.  Pt will be independent with ways to decr tone in LUE (weightbearing, stretching, splinting prn). Baseline:  pt with min spasticity in LUE which affects functional use Goal status: IN PROGRESS  10/01/21  initiated wt. Bearing  and stretching   3.  Pt will improve L grip strength by at least 4lbs to assist in stabilizing objects. Baseline:  69.6lbs Goal status: INITIAL     LONG TERM GOALS: Target date: 10/22/21  Pt will be independent with updated HEP for LUE. Baseline:  dependent, no formal HEP Goal status: INITIAL  2.  Pt will verbalize understanding of adaptive strategies for bilateral UE tasks and report incr ease with cutting food, fastening buttons, fastening zippers. Baseline:  pt reports difficulty with including cutting food, fastening buttons, fastening zippers Goal status: INITIAL  3.  Pt will verbalize understanding of adaptive strategies to incr safety/decr falls during IADLs.  Baseline:   pt reports hx of many falls, pt reports difficulty carrying pots/pans and difficulty with shower transfer. Goal status: INITIAL      ASSESSMENT:  CLINICAL IMPRESSION: Pt progressing towards goals and verbalized understanding of AE/strategies for ADLs after education provided.     PERFORMANCE DEFICITS in functional skills including ADLs, IADLs, coordination, dexterity, sensation, tone, ROM, strength, flexibility, FMC, mobility, balance, body mechanics, decreased knowledge of use of DME, and UE functional use,  and psychosocial skills including habits and routines and  behaviors.   IMPAIRMENTS are limiting patient from ADLs, IADLs, and leisure.   COMORBIDITIES may have co-morbidities  that affects occupational performance. Patient will benefit from skilled OT to address above impairments and improve overall function.  MODIFICATION OR ASSISTANCE TO COMPLETE EVALUATION: Min-Moderate modification of tasks or assist with assess necessary to complete an evaluation.  OT OCCUPATIONAL PROFILE AND HISTORY: Detailed assessment: Review of records and additional review of physical, cognitive, psychosocial history related to current functional performance.  CLINICAL DECISION MAKING: Moderate - several treatment options, min-mod task modification necessary  REHAB POTENTIAL: Good  EVALUATION COMPLEXITY: Moderate   PLAN: OT FREQUENCY: 1x/week  OT DURATION: 8 weeks +evaluation  PLANNED INTERVENTIONS: self care/ADL training, therapeutic exercise, therapeutic activity, neuromuscular re-education, manual therapy, passive range of motion, balance training, functional mobility training, aquatic therapy, splinting, electrical stimulation, ultrasound, paraffin, fluidotherapy, moist heat, cryotherapy, patient/family education, energy conservation, and DME and/or AE instructions  RECOMMENDED OTHER SERVICES: current with PT  CONSULTED AND AGREED WITH PLAN OF CARE: Patient  PLAN FOR NEXT SESSION: review HEP prn, strategies for ADLs prn, coordination, functional reaching, ?splinting and ?add 2-3 more visits   [facility], OT 10/01/2021, 12:38 PM  Willa Frater, OTR/L [facility] [address]  [address] [place], [place]  [address] [phone] phone [phone] 10/01/21 12:38 PM

## 2021-10-01 ENCOUNTER — Encounter: Payer: Self-pay | Admitting: Occupational Therapy

## 2021-10-01 ENCOUNTER — Ambulatory Visit: Payer: Medicaid Other | Admitting: Occupational Therapy

## 2021-10-01 ENCOUNTER — Ambulatory Visit: Payer: Medicaid Other | Admitting: Physical Therapy

## 2021-10-01 ENCOUNTER — Encounter: Payer: Self-pay | Admitting: Physical Therapy

## 2021-10-01 DIAGNOSIS — I69354 Hemiplegia and hemiparesis following cerebral infarction affecting left non-dominant side: Secondary | ICD-10-CM

## 2021-10-01 DIAGNOSIS — R29898 Other symptoms and signs involving the musculoskeletal system: Secondary | ICD-10-CM

## 2021-10-01 DIAGNOSIS — R278 Other lack of coordination: Secondary | ICD-10-CM

## 2021-10-01 DIAGNOSIS — R2681 Unsteadiness on feet: Secondary | ICD-10-CM

## 2021-10-01 DIAGNOSIS — R293 Abnormal posture: Secondary | ICD-10-CM

## 2021-10-01 NOTE — Therapy (Signed)
OUTPATIENT PHYSICAL THERAPY TREATMENT NOTE   Patient Name: [name] MRN: [id number] DOB:18-Jul-1980, 41 y.o., male Today's Date: 10/01/2021  PCP: Bartholome Bill, MD  REFERRING PROVIDER: Bartholome Bill, MD   PHYSICAL THERAPY DISCHARGE SUMMARY  Visits from Start of Care: 8  Current functional level related to goals / functional outcomes: See clinical assessment.   Remaining deficits: Left lateral list, requires rail for safety with stair management, safest with use of SPC for long distance and unlevel ambulation.   Education / Equipment: D/C plan and to continue HEP for maintenance of gains.   Patient agrees to discharge. Patient goals were partially met. Patient is being discharged due to maximized rehab potential.    END OF SESSION:   PT End of Session - 10/01/21 1321     Visit Number 8    Number of Visits 9    Date for PT Re-Evaluation 10/28/21   due to potential delay in scheduling   Authorization Type Medicaid - 3 PT visits approved 08/09/21 - 08/29/21; resubmitted auth for remaining visits on 08/21/2021-approved for 5 PT visits 08/30/2021-10/03/2021.    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   Stroke [facility])    Past Surgical History:  Procedure Laterality Date   I & D EXTREMITY Left 01/12/2020   Procedure: EXCISION DEBRIDEMENT LEFT TIBIAL TUBERCLE, PLACE ANTIBIOTIC BEADS;  Surgeon: Newt Minion, MD;  Location: [place];  Service: Orthopedics;   Laterality: Left;   JOINT REPLACEMENT     KNEE SURGERY     Patient Active Problem List   Diagnosis Date Noted   Ulcer of left lower leg, with necrosis of bone ([place])    Sepsis ([place])    Primary hypertension    Leukocytosis 11/17/2018   Osteomyelitis ([place]) 11/16/2018   HCAP (healthcare-associated pneumonia) 06/28/2013   Chest pain, atypical 06/28/2013   SIRS (systemic inflammatory response syndrome) ([place]) 06/28/2013   Hyponatremia 06/28/2013   Headache 01/05/2013   Leg pain 01/05/2013   Dizziness 01/05/2013   ALCOHOL ABUSE, IN REMISSION 06/10/2007   CIGARETTE SMOKER 06/10/2007   DEPRESSION 06/10/2007   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   HEMIPLEGIA, SPASTIC, NONDOMINANT SIDE 06/10/2007   POSTTRAUMATIC WOUND INFECTION NEC 06/10/2007    REFERRING DIAG: Z86.61 (ICD-10-CM) - Personal history of infections of the central nervous system G81.10 (ICD-10-CM) - Spastic hemiplegia affecting unspecified side Z86.73 (ICD-10-CM) - History of CVA (cerebrovascular accident  THERAPY DIAG:  Hemiplegia and hemiparesis following cerebral infarction affecting left non-dominant side (HCC)  Other symptoms and signs involving the musculoskeletal system  Other lack of coordination  Unsteadiness on feet  Abnormal posture  Rationale for Evaluation and Treatment Rehabilitation  PERTINENT HISTORY: PMH: CVA, encephalitis, HTN   Pt reports hx of 14 surgeries on L knee   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   SUBJECTIVE: Woke up at 4am last night and could not go back to sleep so he is tired today.  Patient denies falls and near misses and he attributes this to using his cane more.  PAIN:  Are you having pain? No  TODAY'S TREATMENT: Assessed BERG:  [facility] PT Assessment - 10/01/21 1325       Berg Balance Test   Sit to Stand Able to stand without using hands and stabilize  independently    Standing Unsupported Able to stand safely 2 minutes    Sitting with Back Unsupported but Feet Supported on Floor or Stool Able to sit safely and securely 2 minutes    Stand to Sit Sits safely with minimal use of hands    Transfers Able to transfer safely, minor use of hands    Standing Unsupported with Eyes Closed Able to stand 10 seconds safely    Standing Unsupported with Feet Together Able to place feet together independently and stand 1 minute safely    From Standing, Reach Forward with Outstretched Arm Can reach confidently >25 cm (10")    From Standing Position, Pick up Object from Floor Able to pick up shoe safely and easily    From Standing Position, Turn to Look Behind Over each Shoulder Looks behind from both sides and weight shifts well    Turn 360 Degrees Able to turn 360 degrees safely but slowly    Standing Unsupported, Alternately Place Feet on Step/Stool Able to complete 4 steps without aid or supervision    Standing Unsupported, One Foot in Front Able to take small step independently and hold 30 seconds    Standing on One Leg Unable to try or needs assist to prevent fall    Total Score 46    Berg comment: 46/56 high fall risk            STAIRS:  Level of Assistance: Modified independence  Stair Negotiation Technique: Alternating Pattern  Forwards with Single Rail on Right  Number of Stairs: 12   Height of Stairs: 6"  Comments: Provided edu on use of rail for safety following performance and in event that patient does not clear toe onto next step to prevent LOB.  30 sec Chair stand:  14 w/o UE support TUG w/o SPC:  16.65 sec  PATIENT EDUCATION: Education details: Continue with HEP.  D/C plan for today. Person educated: Patient Education method: Explanation Education comprehension: verbalized understanding  Verbally reviewed HEP, pt endorses these are slowly getting easier, he feels they help his standing posture, and he likes the ones he has  currently.  Will not addend at this time.   HOME EXERCISE PROGRAM: Access Code: [email or web address] URL: [email or web address] Date: 08/09/2021 Prepared by: Willow Ora  Exercises - Seated Hamstring Stretch  - 1 x daily - 5 x weekly - 1 sets - 3 reps - 30 seconds hold - Sit to Stand with Arms Crossed  - 1 x daily - 5 x weekly - 1 sets - 10 reps - Heel Raises with Counter Support  - 1 x daily - 5 x weekly - 1 sets - 10 reps - Seated Knee Extension with Resistance  - 1 x daily - 5 x weekly - 1 sets - 10 reps  -Bridges   - Side Stepping with Counter Support  - 1 x daily - 5 x weekly - 3 sets - 10  reps - Side Stepping with Resistance at Thighs and Counter Support  - 1 x daily - 5 x weekly - 3 sets - 10 reps - Standing Single Leg Stance with Counter Support  - 1 x daily - 5 x weekly - 1 sets - 3 reps - 30 seconds hold  GOALS: Goals reviewed with patient? Yes   SHORT TERM GOALS: Target date: 08/27/2021      Pt will be independent with initial HEP in order to build upon functional gains made in therapy.   Baseline: pt doing his exercises at home.  Goal status: MET   2.  Pt will undergo further assessment of BERG with LTG written.  Baseline: 08/09/21: 43/56 scored as baseline  Goal status: MET   3.  Pt will ambulate at least 17' with LRAD with supervision over level indoor surfaces in order to demo improved safety with gait at home due to fall risk.  Baseline: pt ambulating with no AD at home. ; 42' no AD supervision Goal status: MET       LONG TERM GOALS: Target date: 10/03/2021     Pt will be independent with final HEP in order to build upon functional gains made in therapy. Baseline: HEP established and pt compliant. Goal status: MET   2.  Pt will improve BERG to at least a 47/56 in order to demo decr fall risk.  Baseline: 43/56; 10/01/2021 46/56 Goal status: PARTIALLY MET   3.  Pt will decr TUG with no AD to 13.5 seconds or less with supervision in order to demo decr fall  risk.  Baseline: 16.1 seconds with no AD, min guard when turning; 10/01/2021 16.65 sec no AD Goal status: NOT MET   4.  Pt will go up and down 12 steps with single vs. Bilat handrail with alternating pattern in order to safely get in and out of his apartment  Baseline: needs cues for proper sequencing; 10/01/2021 R rail reciprocally Goal status: MET   5.  Pt will perform at least 13 sit <> stands with no UE support in 30 seconds in order to demo improved functional strength/endurance.  Baseline: 11 sit <> stands w/ no UE support ; 10/01/2021 14 w/o UE support Goal status: MET     ASSESSMENT:   CLINICAL IMPRESSION:  Assessed LTGs this session with patient meeting 3 of 5 and partially meeting 1 of 5 and not meeting the last goal.  He is compliant to HEP and is reporting increased activity compared to prior.  Reviewed HEP without changes this session.  Pt uses right rail with mod I performance of 12 stairs with encouragement to continue using rail for safe independence in home environment.  He completes 14 STS in 30 seconds w/o UE support or LOB, but demonstrates no change in his TUG time w/o AD from evaluation.  His BERG score improved to 46/56 with patient still challenged by single leg stance and narrowed BOS.  He continues to have some postural deficits from baseline that he feels his HEP is helping to address.  He has overall improved and seems to be at a functional baseline at this time and is in agreement to discharge from PT.   OBJECTIVE IMPAIRMENTS Abnormal gait, decreased activity tolerance, decreased balance, decreased coordination, decreased endurance, decreased knowledge of use of DME, difficulty walking, decreased ROM, decreased strength, decreased safety awareness, increased muscle spasms, impaired flexibility, impaired tone, impaired UE functional use, and postural dysfunction.    ACTIVITY LIMITATIONS cleaning and community  activity.    PERSONAL FACTORS Age, Behavior pattern,  Past/current experiences, Time since onset of injury/illness/exacerbation, and 3+ comorbidities: CVA, encephalitis, HTN  are also affecting patient's functional outcome.      REHAB POTENTIAL: Fair due to chronicity of condition    CLINICAL DECISION MAKING: Evolving/moderate complexity   EVALUATION COMPLEXITY: Moderate   PLAN: PT FREQUENCY: 1x/week   PT DURATION: 12 weeks   PLANNED INTERVENTIONS: Therapeutic exercises, Therapeutic activity, Neuromuscular re-education, Balance training, Gait training, Patient/Family education, Stair training, Vestibular training, DME instructions, and Manual therapy   PLAN FOR NEXT SESSION: N/A  Elease Etienne, PT, DPT 10/01/21, 2:00 PM

## 2021-10-07 NOTE — Therapy (Signed)
OUTPATIENT OCCUPATIONAL THERAPY NEURO TREATMENT  Patient Name: [name] MRN: [id number] DOB:1980/11/27, 41 y.o., male Today's Date: 10/08/2021  PCP: Dr. Lynelle Smoke [place] REFERRING PROVIDER: Bartholome Bill, MD    OT End of Session - 10/08/21 1219     Visit Number 4    Number of Visits 9    Date for OT Re-Evaluation 10/22/21    Authorization Type Medicaid [place]--approved for 7 OT visits 7/10-11/18/21    Authorization - Visit Number 3    Authorization - Number of Visits 7    OT Start Time 1232    OT Stop Time 1315    OT Time Calculation (min) 43 min    Activity Tolerance Patient tolerated treatment well    Behavior During Therapy WFL for tasks assessed/performed                Past Medical History:  Diagnosis Date   Depression    Encephalitis    Hypertension    Meningitis    Paralysis, 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   Stroke [facility])    Past Surgical History:  Procedure Laterality Date   I & D EXTREMITY Left 01/12/2020   Procedure: EXCISION DEBRIDEMENT LEFT TIBIAL TUBERCLE, PLACE ANTIBIOTIC BEADS;  Surgeon: Newt Minion, MD;  Location: [place];  Service: Orthopedics;  Laterality: Left;   JOINT REPLACEMENT     KNEE SURGERY     Patient Active Problem List   Diagnosis Date Noted   Ulcer of left lower leg, with necrosis of bone ([place])    Sepsis ([place])    Primary hypertension    Leukocytosis 11/17/2018   Osteomyelitis ([place]) 11/16/2018   HCAP (healthcare-associated pneumonia) 06/28/2013   Chest pain, atypical 06/28/2013   SIRS (systemic inflammatory response syndrome) ([place]) 06/28/2013   Hyponatremia 06/28/2013   Headache 01/05/2013   Leg pain 01/05/2013   Dizziness 01/05/2013   ALCOHOL ABUSE, IN REMISSION 06/10/2007   CIGARETTE SMOKER 06/10/2007   DEPRESSION 06/10/2007   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   HEMIPLEGIA, SPASTIC, NONDOMINANT SIDE 06/10/2007   POSTTRAUMATIC WOUND INFECTION NEC 06/10/2007    ONSET  DATE: 08/08/21 referral date  REFERRING DIAG: G81.10 (ICD-10-CM) - Spastic hemiplegia affecting unspecified side   THERAPY DIAG:  Hemiplegia and hemiparesis following cerebral infarction affecting left non-dominant side (HCC)  Other symptoms and signs involving the musculoskeletal system  Other lack of coordination  Unsteadiness on feet  Abnormal posture  Rationale for Evaluation and Treatment Rehabilitation  SUBJECTIVE:   SUBJECTIVE STATEMENT: Doing ok    Pt accompanied by: self  PERTINENT HISTORY:  Pt with history of encephalitis and meningitis at the age of 22 resulting in CVA with L spastic hemiplegia.   PMH: CVA, encephalitis, HTN, depression, Pt reports hx of 14 surgeries on L knee    PRECAUTIONS: Fall  PAIN:  Are you having pain? No  besides arthritis which comes and goes  PATIENT GOALS  strengthen L hand, be able to hold objects in place easier  OBJECTIVE:   HAND DOMINANCE: Right  TODAY'S TREATMENT:   Fabricated resting hand-type splint with wrist in ext and IPs extended as able for improved positioning and spasticity management.  Pt able to return demo donning/doffing with min cueing initially.  Placing small pegs in pegboard for incr coordination with min difficulty.    PATIENT EDUCATION: Education details: Green Putty HEP--see pt instructions; Splint Wear/care and precautions for resting hand type splint (up to 2-3hrs/day, checking skin frequently) Person educated: Patient  Education method: explanation, demo, handouts, v.c.  Education comprehension: verbalized understanding and returned demonstration   HOME EXERCISE PROGRAM: 09/24/21:  Initial HEP 10/01/21:  AE/strategies for ADLs including:  strategies for sliding pots/pans and draining water from pot (for improved safety and decr fall risk), use of button hook (and practiced with good success), choppers, various rocker knives, kitchen scissors  10/08/21:  [place] Putty HEP--see pt instructions; Splint  Wear/care and precautions for resting hand type splint (up to 2-3hrs/day, checking skin frequently)    GOALS: Potential Goals reviewed with patient? Yes  SHORT TERM GOALS: Target date: 09/21/21  Pt will be independent with initial HEP for LUE. Baseline:  dependent, no formal HEP Goal status: MET  2.  Pt will be independent with ways to decr tone in LUE (weightbearing, stretching, splinting prn). Baseline:  pt with min spasticity in LUE which affects functional use Goal status: IN PROGRESS  10/01/21  initiated wt. Bearing  and stretching   3.  Pt will improve L grip strength by at least 4lbs to assist in stabilizing objects. Baseline:  69.6lbs Goal status: INITIAL     LONG TERM GOALS: Target date: 10/22/21  Pt will be independent with updated HEP for LUE. Baseline:  dependent, no formal HEP Goal status: INITIAL  2.  Pt will verbalize understanding of adaptive strategies for bilateral UE tasks and report incr ease with cutting food, fastening buttons, fastening zippers. Baseline:  pt reports difficulty with including cutting food, fastening buttons, fastening zippers Goal status: INITIAL  3.  Pt will verbalize understanding of adaptive strategies to incr safety/decr falls during IADLs.  Baseline:   pt reports hx of many falls, pt reports difficulty carrying pots/pans and difficulty with shower transfer. Goal status: INITIAL      ASSESSMENT:  CLINICAL IMPRESSION: Pt is progressing towards goals.  He verbalizes understanding of putty HEP and splint wear/care after instruction.     PERFORMANCE DEFICITS in functional skills including ADLs, IADLs, coordination, dexterity, sensation, tone, ROM, strength, flexibility, FMC, mobility, balance, body mechanics, decreased knowledge of use of DME, and UE functional use,  and psychosocial skills including habits and routines and behaviors.   IMPAIRMENTS are limiting patient from ADLs, IADLs, and leisure.   COMORBIDITIES may have  co-morbidities  that affects occupational performance. Patient will benefit from skilled OT to address above impairments and improve overall function.  MODIFICATION OR ASSISTANCE TO COMPLETE EVALUATION: Min-Moderate modification of tasks or assist with assess necessary to complete an evaluation.  OT OCCUPATIONAL PROFILE AND HISTORY: Detailed assessment: Review of records and additional review of physical, cognitive, psychosocial history related to current functional performance.  CLINICAL DECISION MAKING: Moderate - several treatment options, min-mod task modification necessary  REHAB POTENTIAL: Good  EVALUATION COMPLEXITY: Moderate   PLAN: OT FREQUENCY: 1x/week  OT DURATION: 8 weeks +evaluation  PLANNED INTERVENTIONS: self care/ADL training, therapeutic exercise, therapeutic activity, neuromuscular re-education, manual therapy, passive range of motion, balance training, functional mobility training, aquatic therapy, splinting, electrical stimulation, ultrasound, paraffin, fluidotherapy, moist heat, cryotherapy, patient/family education, energy conservation, and DME and/or AE instructions  RECOMMENDED OTHER SERVICES: current with PT  CONSULTED AND AGREED WITH PLAN OF CARE: Patient  PLAN FOR NEXT SESSION: check splint, recheck progress towards STGs,  strategies for ADLs prn/standing functional reaching focusing on safety, coordination L hand   Solomon Skowronek, OTR/L 10/08/2021, 12:43 PM

## 2021-10-08 ENCOUNTER — Encounter: Payer: Self-pay | Admitting: Occupational Therapy

## 2021-10-08 ENCOUNTER — Ambulatory Visit: Payer: Medicaid Other | Admitting: Occupational Therapy

## 2021-10-08 DIAGNOSIS — R29898 Other symptoms and signs involving the musculoskeletal system: Secondary | ICD-10-CM

## 2021-10-08 DIAGNOSIS — I69354 Hemiplegia and hemiparesis following cerebral infarction affecting left non-dominant side: Secondary | ICD-10-CM

## 2021-10-08 DIAGNOSIS — R293 Abnormal posture: Secondary | ICD-10-CM

## 2021-10-08 DIAGNOSIS — R278 Other lack of coordination: Secondary | ICD-10-CM

## 2021-10-08 DIAGNOSIS — R2681 Unsteadiness on feet: Secondary | ICD-10-CM

## 2021-10-08 NOTE — Patient Instructions (Addendum)
  Extension (Assistive Putty)   Roll putty back and forth, being sure to use all fingertips. Repeat 3 times. Do 1-2 sessions per day.  Then pinch as below.   Palmar Pinch Strengthening (Resistive Putty)   Pinch putty between thumb and each fingertip in turn after rolling out   Grip Strengthening (Resistive Putty)   Squeeze putty using thumb and all fingers. Repeat 20 times. Do 1-2 sessions per day.                Your Splint This splint should initially be fitted by a healthcare practitioner.  The healthcare practitioner is responsible for providing wearing instructions and precautions to the patient, other healthcare practitioners and care provider involved in the patient's care.  This splint was custom made for you. Please read the following instructions to learn about wearing and caring for your splint.  Precautions Should your splint cause any of the following problems, remove the splint immediately and contact your therapist/physician. Swelling Severe Pain Pressure Areas Stiffness Numbness  Do not wear your splint while operating machinery unless it has been fabricated for that purpose.  When To Wear Your Splint Where your splint according to your therapist/physician instructions. Daytime for 2-3 hours  Care and Cleaning of Your Splint Keep your splint away from open flames. Your splint will lose its shape in temperatures over 135 degrees Farenheit, ( in car windows, near radiators, ovens or in hot water).  Never make any adjustments to your splint, if the splint needs adjusting remove it and make an appointment to see your therapist. Your splint, including the cushion liner may be cleaned with soap and lukewarm water.  Do not immerse in hot water over 135 degrees Farenheit. Straps may be washed with soap and water, but do not moisten the self-adhesive portion. For ink or hard to remove spots use a scouring cleanser which contains chlorine.  Rinse the splint  thoroughly after using chlorine cleanser.

## 2021-10-16 ENCOUNTER — Ambulatory Visit: Payer: Medicaid Other | Attending: Family Medicine | Admitting: Occupational Therapy

## 2021-10-16 DIAGNOSIS — R278 Other lack of coordination: Secondary | ICD-10-CM | POA: Diagnosis present

## 2021-10-16 DIAGNOSIS — R293 Abnormal posture: Secondary | ICD-10-CM | POA: Diagnosis present

## 2021-10-16 DIAGNOSIS — I69354 Hemiplegia and hemiparesis following cerebral infarction affecting left non-dominant side: Secondary | ICD-10-CM | POA: Diagnosis present

## 2021-10-16 DIAGNOSIS — R2689 Other abnormalities of gait and mobility: Secondary | ICD-10-CM | POA: Diagnosis present

## 2021-10-16 DIAGNOSIS — R29898 Other symptoms and signs involving the musculoskeletal system: Secondary | ICD-10-CM

## 2021-10-16 NOTE — Therapy (Addendum)
OUTPATIENT OCCUPATIONAL THERAPY NEURO TREATMENT  Patient Name: [name] MRN: [id number] DOB:1981-02-25, 41 y.o., male Today's Date: 10/16/2021  PCP: Dr. 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   Stroke [facility])    Past Surgical History:  Procedure Laterality Date   I & D EXTREMITY Left 01/12/2020   Procedure: EXCISION DEBRIDEMENT LEFT TIBIAL TUBERCLE, PLACE ANTIBIOTIC BEADS;  Surgeon: Newt Minion, MD;  Location: [place];  Service: Orthopedics;  Laterality: Left;   JOINT REPLACEMENT     KNEE SURGERY     Patient Active Problem List   Diagnosis Date Noted   Ulcer of left lower leg, with necrosis of bone ([place])    Sepsis ([place])    Primary hypertension    Leukocytosis 11/17/2018   Osteomyelitis ([place]) 11/16/2018   HCAP (healthcare-associated pneumonia) 06/28/2013   Chest pain, atypical 06/28/2013   SIRS (systemic inflammatory response syndrome) ([place]) 06/28/2013   Hyponatremia 06/28/2013   Headache 01/05/2013   Leg pain 01/05/2013   Dizziness 01/05/2013   ALCOHOL ABUSE, IN REMISSION 06/10/2007   CIGARETTE SMOKER 06/10/2007   DEPRESSION 06/10/2007   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   HEMIPLEGIA, SPASTIC, NONDOMINANT SIDE 06/10/2007   POSTTRAUMATIC WOUND INFECTION NEC 06/10/2007    ONSET DATE: 08/08/21 referral date  REFERRING DIAG: G81.10 (ICD-10-CM) - Spastic hemiplegia affecting unspecified side   THERAPY DIAG:  Hemiplegia and hemiparesis following cerebral infarction  affecting left non-dominant side (HCC)  Other symptoms and signs involving the musculoskeletal system  Other lack of coordination  Abnormal posture  Other abnormalities of gait and mobility  Rationale for Evaluation and Treatment Rehabilitation  SUBJECTIVE:   SUBJECTIVE STATEMENT: Doing ok    Pt accompanied by: self  PERTINENT HISTORY:  Pt with history of 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   PMH: CVA, encephalitis, HTN, depression, Pt reports hx of 14 surgeries on L knee    PRECAUTIONS: Fall  PAIN:  Are you having pain? No  besides arthritis which comes and goes  PATIENT GOALS  strengthen L hand, be able to hold objects in place easier  OBJECTIVE:   HAND DOMINANCE: Right  TODAY'S TREATMENT:   Splint check for LUE, splint fit is good, however pt demonstrates maceration at strap sites as he has been wear splint longer and more often than instructed. Splint was removed and pt was instructed not to reapply today. Therapist instructed pt to only wear splint 2-3 hours at a time Pt practiced cutting food with good success, min v.c initally for left hand use.  Stacking coins with LUE for increased fine motor coordination, then manipulating in hand to place in container Flipping playing cards with LUE, min v.c for finger extension and increased time.    PATIENT EDUCATION: Education details: splint wear schedule,  care and precautions Person educated: Patient Education method: explanation, demo, handouts, v.c.  Education comprehension: verbalized understanding and returned demonstration   HOME EXERCISE PROGRAM: 09/24/21:  Initial HEP 10/01/21:  AE/strategies for ADLs including:  strategies for sliding pots/pans and draining water from pot (for improved safety and decr fall risk), use of button hook (and practiced with good success), choppers, various rocker knives, kitchen scissors  10/08/21:  [place] Putty HEP--see pt  instructions; Splint Wear/care and precautions for resting hand type splint (up to 2-3hrs/day, checking skin frequently)    GOALS: Potential Goals reviewed with patient? Yes  SHORT TERM GOALS: Target date: 09/21/21  Pt will be independent with initial HEP for LUE. Baseline:  dependent, no formal HEP Goal status: MET  2.  Pt will be independent with ways to decr tone in LUE (weightbearing, stretching, splinting prn). Baseline:  pt with min spasticity in LUE which affects functional use Goal status: IN PROGRESS  10/01/21  initiated wt. Bearing  and stretching   3.  Pt will improve L grip strength by at least 4lbs to assist in stabilizing objects. Baseline:  69.6lbs Goal status:MET 83.3 lbs     LONG TERM GOALS: Target date: 10/22/21  Pt will be independent with updated HEP for LUE. Baseline:  dependent, no formal HEP Goal status: INITIAL  2.  Pt will verbalize understanding of adaptive strategies for bilateral UE tasks and report incr ease with cutting food, fastening buttons, fastening zippers. Baseline:  pt reports difficulty with including cutting food, fastening buttons, fastening zippers Goal status: INITIAL  3.  Pt will verbalize understanding of adaptive strategies to incr safety/decr falls during IADLs.  Baseline:   pt reports hx of many falls, pt reports difficulty carrying pots/pans and difficulty with shower transfer. Goal status: INITIAL      ASSESSMENT:  CLINICAL IMPRESSION: Pt is progressing towards goals.  He verbalizes understanding of splint wear/care after review and how to prevent maceration.     PERFORMANCE DEFICITS in functional skills including ADLs, IADLs, coordination, dexterity, sensation, tone, ROM, strength, flexibility, FMC, mobility, balance, body mechanics, decreased knowledge of use of DME, and UE functional use,  and psychosocial skills including habits and routines and behaviors.   IMPAIRMENTS are limiting patient from ADLs, IADLs, and  leisure.   COMORBIDITIES may have co-morbidities  that affects occupational performance. Patient will benefit from skilled OT to address above impairments and improve overall function.  MODIFICATION OR ASSISTANCE TO COMPLETE EVALUATION: Min-Moderate modification of tasks or assist with assess necessary to complete an evaluation.  OT OCCUPATIONAL PROFILE AND HISTORY: Detailed assessment: Review of records and additional review of physical, cognitive, psychosocial history related to current functional performance.  CLINICAL DECISION MAKING: Moderate - several treatment options, min-mod task modification necessary  REHAB POTENTIAL: Good  EVALUATION COMPLEXITY: Moderate   PLAN: OT FREQUENCY: 1x/week  OT DURATION: 8 weeks +evaluation  PLANNED INTERVENTIONS: self care/ADL training, therapeutic exercise, therapeutic activity, neuromuscular re-education, manual therapy, passive range of motion, balance training, functional mobility training, aquatic therapy, splinting, electrical stimulation, ultrasound, paraffin, fluidotherapy, moist heat, cryotherapy, patient/family education, energy conservation, and DME and/or AE instructions  RECOMMENDED OTHER SERVICES: current with PT  CONSULTED AND AGREED WITH PLAN OF CARE: Patient  PLAN FOR NEXT SESSION: check splint,  strategies for ADLs prn/standing functional reaching focusing on safety, coordination L hand   Jovonda Selner, OTR/L 10/16/2021, 12:42 PM

## 2021-10-16 NOTE — Patient Instructions (Addendum)
If your left hand becomes sweaty while wearing splint, remove the splint, wash and dry your hands thoroughly Dry off your splint and  and keep splint off for several hours If you notice pressure areas, increased redness or maceration( white coloration from moist hand, stop wearing splint and bring it to your next visit) If not problems wear splint for 2-3 hrs per day only!  Do not wear splint when driving,

## 2021-10-23 ENCOUNTER — Ambulatory Visit: Payer: Medicaid Other | Admitting: Occupational Therapy

## 2021-11-01 ENCOUNTER — Ambulatory Visit: Payer: Medicaid Other | Admitting: Occupational Therapy

## 2021-11-15 ENCOUNTER — Encounter: Payer: Self-pay | Admitting: Occupational Therapy

## 2021-11-15 NOTE — Therapy (Signed)
[facility] [address] [place], [place], [address] Phone: [phone]   Fax:  [phone]  Patient Details  Name: [name] MRN: [id number] Date of Birth: [birth year] Referring Provider:  No ref. provider found  Encounter Date: 11/15/2021  OCCUPATIONAL THERAPY DISCHARGE SUMMARY  Visits from Start of Care: 5  Current functional level related to goals / functional outcomes: SHORT TERM GOALS: Target date: 09/21/21  Pt will be independent with initial HEP for LUE. Baseline:  dependent, no formal HEP Goal status: MET  2.  Pt will be independent with ways to decr tone in LUE (weightbearing, stretching, splinting prn). Baseline:  pt with min spasticity in LUE which affects functional use Goal status:  10/01/21  initiated wt. Bearing  and stretching.  MET.  Issued splint as well  3.  Pt will improve L grip strength by at least 4lbs to assist in stabilizing objects. Baseline:  69.6lbs Goal status:MET 83.3 lbs     LONG TERM GOALS: Target date: 10/22/21  Pt will be independent with updated HEP for LUE. Baseline:  dependent, no formal HEP Goal status: Met.  2.  Pt will verbalize understanding of adaptive strategies for bilateral UE tasks and report incr ease with cutting food, fastening buttons, fastening zippers. Baseline:  pt reports difficulty with including cutting food, fastening buttons, fastening zippers Goal status:  Partially met/did not return to review  3.  Pt will verbalize understanding of adaptive strategies to incr safety/decr falls during IADLs.  Baseline:   pt reports hx of many falls, pt reports difficulty carrying pots/pans and difficulty with shower transfer. Goal status:  Not fully met   **All goals not fully met/addressed due to pt not returning for last 2 scheduled visit.   Remaining deficits:  L hemiparesis with spasticity, decr coordination, decr balance.      Education / Equipment: Pt  instructed in Initial HEP, AE/strategies for ADLs (including:  strategies for sliding pots/pans and draining water from pot for improved safety and decr fall risk, use of button hook, choppers, various rocker knives, kitchen scissors), Green Putty HEP, Splint Wear/care and precautions for resting hand type splint.  Pt verbalized understanding of education provided, but did not return for last 2 visits for review/completion of education.  Patient agrees to discharge. Patient goals were partially met. Patient is being discharged due to not returning since the last visit.Marland Kitchen    [facility], OTR/L 11/15/2021, 12:29 PM  [facility] [address] [place], [place], [address] Phone: [phone]   Fax:  [phone]

## 2021-11-19 ENCOUNTER — Ambulatory Visit
Admission: EM | Admit: 2021-11-19 | Discharge: 2021-11-19 | Disposition: A | Discharge status: Home / Self Care | Payer: Medicaid Other | Attending: Urgent Care | Admitting: Urgent Care

## 2021-11-19 ENCOUNTER — Ambulatory Visit (INDEPENDENT_AMBULATORY_CARE_PROVIDER_SITE_OTHER): Payer: Medicaid Other

## 2021-11-19 DIAGNOSIS — R509 Fever, unspecified: Secondary | ICD-10-CM | POA: Diagnosis present

## 2021-11-19 DIAGNOSIS — R5383 Other fatigue: Secondary | ICD-10-CM | POA: Diagnosis present

## 2021-11-19 DIAGNOSIS — M25562 Pain in left knee: Secondary | ICD-10-CM | POA: Diagnosis not present

## 2021-11-19 DIAGNOSIS — W19XXXA Unspecified fall, initial encounter: Secondary | ICD-10-CM | POA: Diagnosis not present

## 2021-11-19 DIAGNOSIS — R5381 Other malaise: Secondary | ICD-10-CM

## 2021-11-19 DIAGNOSIS — Z20822 Contact with and (suspected) exposure to covid-19: Secondary | ICD-10-CM | POA: Diagnosis not present

## 2021-11-19 DIAGNOSIS — M1712 Unilateral primary osteoarthritis, left knee: Secondary | ICD-10-CM | POA: Insufficient documentation

## 2021-11-19 MED ORDER — ACETAMINOPHEN 325 MG PO TABS
975.0000 mg | ORAL_TABLET | Freq: Once | ORAL | Status: AC
  Administered 2021-11-19: 975 mg via ORAL

## 2021-11-19 NOTE — ED Provider Notes (Addendum)
[facility]  Note:  This document was prepared using Dragon voice recognition software and may include unintentional dictation errors.  MRN: [id number] DOB: 07/14/1980  Subjective:   [name] is a 41 y.o. male presenting for 4 day history of acute onset malaise, fatigue, dizziness.  States that the only thing he can think of that was changed recently was his blood pressure medication.  He also fell last night.  Had fallen previously and suffered a wound to the left knee.  Has a history of surgery to the left knee 2 years ago.  No current facility-administered medications for this encounter.  Current Outpatient Medications:    amlodipine-benazepril (LOTREL) 2.5-10 MG capsule, Take 1 capsule by mouth daily., Disp: , Rfl:    tadalafil (CIALIS) 20 MG tablet, TAKE ONE TABLET BY MOUTH DAILY AS NEEDED FOR ERECTILE DYSFUNCTION, Disp: , Rfl:    lisinopril (ZESTRIL) 10 MG tablet, Take 10 mg by mouth daily. , Disp: , Rfl:    Allergies  Allergen Reactions   Ciprofloxacin Other (See Comments)    Reaction=hypertension   Hydrocodone-Acetaminophen Hives   Ibuprofen Hives   Penicillins Hives and Nausea Only    Did it involve swelling of the face/tongue/throat, SOB, or low BP? N Did it involve sudden or severe rash/hives, skin peeling, or any reaction on the inside of your mouth or nose? N Did you need to seek medical attention at a hospital or doctor's office? N When did it last happen?      3 months ago If all above answers are "NO", may proceed with cephalosporin use.    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   Stroke [facility])      Past Surgical History:  Procedure Laterality Date   I & D EXTREMITY Left 01/12/2020   Procedure: EXCISION DEBRIDEMENT LEFT TIBIAL  TUBERCLE, PLACE ANTIBIOTIC BEADS;  Surgeon: Nadara Mustard, MD;  Location: [facility];  Service: Orthopedics;  Laterality: Left;   JOINT REPLACEMENT     KNEE SURGERY      Family History  Problem Relation Age of Onset   Hypertension Mother    Hypertension Father     Social History   Tobacco Use   Smoking status: Every Day    Packs/day: 0.25    Types: Cigarettes   Smokeless tobacco: Never  Vaping Use   Vaping Use: Never used  Substance Use Topics   Alcohol use: No   Drug use: Yes    Types: Marijuana    Comment: not used in 3 weeks    ROS   Objective:   Vitals: BP 121/83 (BP Location: Right Arm)   Pulse (!) 120   Temp (!) 101.1 F (38.4 C) (Oral)   Resp 18   SpO2 96%   Physical Exam Constitutional:      General: He is not in acute distress.    Appearance: Normal appearance. He is well-developed. He is not ill-appearing, toxic-appearing or diaphoretic.  HENT:     Head: Normocephalic and atraumatic.     Right Ear: External ear normal.     Left Ear: External ear normal.     Nose: Nose normal.     Mouth/Throat:     Mouth: Mucous membranes are moist.  Eyes:  General: No scleral icterus.       Right eye: No discharge.        Left eye: No discharge.     Extraocular Movements: Extraocular movements intact.  Cardiovascular:     Rate and Rhythm: Normal rate and regular rhythm.     Heart sounds: Normal heart sounds. No murmur heard.    No friction rub. No gallop.  Pulmonary:     Effort: Pulmonary effort is normal. No respiratory distress.     Breath sounds: Normal breath sounds. No stridor. No wheezing, rhonchi or rales.  Musculoskeletal:     Left knee: No swelling, deformity, effusion, erythema, ecchymosis, lacerations, bony tenderness or crepitus. Decreased range of motion. Tenderness present over the patellar tendon. No medial joint line or lateral joint line tenderness. Normal alignment and normal patellar mobility.       Legs:  Neurological:     Mental Status:  He is alert and oriented to person, place, and time.     Coordination: Coordination abnormal.     Gait: Gait abnormal.  Psychiatric:        Mood and Affect: Mood normal.        Behavior: Behavior normal.        Thought Content: Thought content normal.     DG Knee Complete 4 Views Left  Result Date: 11/19/2021 CLINICAL DATA:  Dizziness, fall, pain EXAM: LEFT KNEE - COMPLETE 4+ VIEW COMPARISON:  01/06/2020 FINDINGS: No recent fracture or dislocation is seen. Bony spurs are seen in patella. Smoothly marginated calcification at the attachment of infrapatellar tendon to proximal tibia has not changed significantly. There is possible minimal effusion in suprapatellar bursa. There is coarsening of trabeculae in distal femur and proximal tibia without break in the cortical margins. IMPRESSION: No recent fracture or dislocation is seen. Possible small effusion is present in suprapatellar bursa. Degenerative changes are noted, more so in the patellofemoral compartment. Electronically Signed   By: Ernie Avena M.D.   On: 11/19/2021 17:42    Bacitracin applied to the left knee wound, covered with nonadherent dressing and secured with an Ace wrap.  Last GFR was greater than 90 from 10/02/2021.  Assessment and Plan :   PDMP not reviewed this encounter.  1. Fever, unspecified   2. Malaise and fatigue   3. Acute pain of left knee     COVID-19 testing pending.  We will hold off on antibiotic use as I have no suspicion that he has septic arthritis, septic knee joint, infected wound of the left knee.  No signs of fracture.  Patient has follow-up scheduled with his orthopedist and I emphasized need to keep this.  He was given Tylenol in clinic for his fever.  Deferred imaging given clear cardiopulmonary exam, hemodynamically stable vital signs.  If patient test positive for COVID-19, he would be a good candidate for Paxlovid.   Counseled patient on potential for adverse effects with medications  prescribed/recommended today, ER and return-to-clinic precautions discussed, patient verbalized understanding.     Wallis Bamberg, [place] 11/19/21 [phone]

## 2021-11-19 NOTE — Discharge Instructions (Addendum)
Change your dressing 2-3 times daily. Every time you change your dressing, clean the wound gently with warm water and Dial antibacterial soap. Pat the wound dry, let it breathe for roughly an hour before covering it back up. When you reapply a dressing, use non-stick/non-adherent gauze. Apply Bacitracin ointment to the wound. After 4-7 days, the wound should scab over nicely and then you don't have to continue doing dressings.     We will notify you of your test results as they arrive and may take between about 24 hours.  I encourage you to sign up for MyChart if you have not already done so as this can be the easiest way for [place] to communicate results to you online or through a phone app.  Generally, we only contact you if it is a positive test result.  In the meantime, if you develop worsening symptoms including fever, chest pain, shortness of breath despite our current treatment plan then please report to the emergency room as this may be a sign of worsening status from possible viral infection.  Otherwise, we will manage this as a viral syndrome. For sore throat or cough try using a honey-based tea. Use 3 teaspoons of honey with juice squeezed from half lemon. Place shaved pieces of ginger into 1/2-1 cup of water and warm over stove top. Then mix the ingredients and repeat every 4 hours as needed. Please take Tylenol 500mg -650mg  every 6 hours for aches and pains, fevers. Hydrate very well with at least 2 liters of water. Eat light meals such as soups to replenish electrolytes and soft fruits, veggies. Start an antihistamine like Zyrtec for postnasal drainage, sinus congestion.  Use cough medications as needed.

## 2021-11-19 NOTE — ED Triage Notes (Signed)
Pt reports a new BP medication has pt feeling "weird" with dizziness, started taking it on Friday. Reports falling last night and injuring left knee.

## 2021-11-21 LAB — SARS CORONAVIRUS 2 (TAT 6-24 HRS): SARS Coronavirus 2: NEGATIVE

## 2022-09-14 ENCOUNTER — Encounter (HOSPITAL_COMMUNITY): Payer: Self-pay

## 2022-09-14 ENCOUNTER — Emergency Department (HOSPITAL_COMMUNITY)
Admission: EM | Admit: 2022-09-14 | Discharge: 2022-09-14 | Disposition: A | Discharge status: Home / Self Care | Payer: BLUE CROSS/BLUE SHIELD | Attending: Emergency Medicine | Admitting: Emergency Medicine

## 2022-09-14 ENCOUNTER — Other Ambulatory Visit: Payer: Self-pay

## 2022-09-14 DIAGNOSIS — R350 Frequency of micturition: Secondary | ICD-10-CM | POA: Insufficient documentation

## 2022-09-14 DIAGNOSIS — M545 Low back pain, unspecified: Secondary | ICD-10-CM | POA: Diagnosis present

## 2022-09-14 DIAGNOSIS — Z79899 Other long term (current) drug therapy: Secondary | ICD-10-CM | POA: Diagnosis not present

## 2022-09-14 DIAGNOSIS — R110 Nausea: Secondary | ICD-10-CM | POA: Insufficient documentation

## 2022-09-14 DIAGNOSIS — M25552 Pain in left hip: Secondary | ICD-10-CM | POA: Diagnosis not present

## 2022-09-14 DIAGNOSIS — I10 Essential (primary) hypertension: Secondary | ICD-10-CM | POA: Insufficient documentation

## 2022-09-14 LAB — URINALYSIS, ROUTINE W REFLEX MICROSCOPIC
Bacteria, UA: NONE SEEN
Bilirubin Urine: NEGATIVE
Glucose, UA: NEGATIVE mg/dL
Ketones, ur: 5 mg/dL — AB
Leukocytes,Ua: NEGATIVE
Nitrite: NEGATIVE
Protein, ur: NEGATIVE mg/dL
Specific Gravity, Urine: 1.021 (ref 1.005–1.030)
pH: 6 (ref 5.0–8.0)

## 2022-09-14 LAB — CBC WITH DIFFERENTIAL/PLATELET
Abs Immature Granulocytes: 0.05 10*3/uL (ref 0.00–0.07)
Basophils Absolute: 0.1 10*3/uL (ref 0.0–0.1)
Basophils Relative: 1 %
Eosinophils Absolute: 0 10*3/uL (ref 0.0–0.5)
Eosinophils Relative: 0 %
HCT: 44.3 % (ref 39.0–52.0)
Hemoglobin: 14.6 g/dL (ref 13.0–17.0)
Immature Granulocytes: 1 %
Lymphocytes Relative: 16 %
Lymphs Abs: 1.7 10*3/uL (ref 0.7–4.0)
MCH: 28.1 pg (ref 26.0–34.0)
MCHC: 33 g/dL (ref 30.0–36.0)
MCV: 85.4 fL (ref 80.0–100.0)
Monocytes Absolute: 1.2 10*3/uL — ABNORMAL HIGH (ref 0.1–1.0)
Monocytes Relative: 12 %
Neutro Abs: 7.3 10*3/uL (ref 1.7–7.7)
Neutrophils Relative %: 70 %
Platelets: 365 10*3/uL (ref 150–400)
RBC: 5.19 MIL/uL (ref 4.22–5.81)
RDW: 13.8 % (ref 11.5–15.5)
WBC: 10.3 10*3/uL (ref 4.0–10.5)
nRBC: 0 % (ref 0.0–0.2)

## 2022-09-14 LAB — COMPREHENSIVE METABOLIC PANEL
ALT: 25 U/L (ref 0–44)
AST: 24 U/L (ref 15–41)
Albumin: 3.5 g/dL (ref 3.5–5.0)
Alkaline Phosphatase: 67 U/L (ref 38–126)
Anion gap: 14 (ref 5–15)
BUN: 12 mg/dL (ref 6–20)
CO2: 24 mmol/L (ref 22–32)
Calcium: 9 mg/dL (ref 8.9–10.3)
Chloride: 97 mmol/L — ABNORMAL LOW (ref 98–111)
Creatinine, Ser: 1 mg/dL (ref 0.61–1.24)
GFR, Estimated: >60 mL/min (ref >60)
Glucose, Bld: 84 mg/dL (ref 70–99)
Potassium: 4 mmol/L (ref 3.5–5.1)
Sodium: 135 mmol/L (ref 135–145)
Total Bilirubin: 0.5 mg/dL (ref 0.3–1.2)
Total Protein: 6.7 g/dL (ref 6.5–8.1)

## 2022-09-14 MED ORDER — LIDOCAINE 5 % EX PTCH
1.0000 | MEDICATED_PATCH | CUTANEOUS | Status: DC
  Administered 2022-09-14: 1 via TRANSDERMAL
  Filled 2022-09-14: qty 1

## 2022-09-14 MED ORDER — KETOROLAC TROMETHAMINE 30 MG/ML IJ SOLN
30.0000 mg | Freq: Once | INTRAMUSCULAR | Status: AC
  Administered 2022-09-14: 30 mg via INTRAMUSCULAR
  Filled 2022-09-14: qty 1

## 2022-09-14 MED ORDER — ONDANSETRON 4 MG PO TBDP
4.0000 mg | ORAL_TABLET | Freq: Once | ORAL | Status: AC
  Administered 2022-09-14: 4 mg via ORAL
  Filled 2022-09-14: qty 1

## 2022-09-14 MED ORDER — ONDANSETRON 4 MG PO TBDP: 4.0000 mg | ORAL_TABLET | Freq: Three times a day (TID) | ORAL | 0 refills | Status: DC | PRN

## 2022-09-14 NOTE — ED Triage Notes (Addendum)
Pt c/o low back pain, frequent urination, fever, and nausea x2 days.  Pain score 8/10.  Pt has not taken anything for symptoms. Denies abdominal pain.

## 2022-09-14 NOTE — Discharge Instructions (Addendum)
Please take your medications as prescribed. Take tylenol/ibuprofen every 6 hours for pain. I recommend close follow-up with PCP for reevaluation.  Please do not hesitate to return to emergency department if worrisome signs symptoms we discussed become apparent.  

## 2022-09-14 NOTE — ED Provider Notes (Signed)
[place] EMERGENCY DEPARTMENT AT [facility] Provider Note   CSN: [id number] Arrival date & time: 09/14/22  4098     History  Chief Complaint  Patient presents with   Back Pain   Nausea    [name] is a 42 y.o. male with a past history of depression, hypertension, meningitis, stroke presents today for evaluation of multiple complaints.  States he has had back pain, frequent urination in the last 2 days.  He also reports subjective fever with cold/chills and nausea.  Denies any abdominal pain.  He also reports pain on the left side of his body from the hip.  Denies any recent fall or injury.  He denies any urinary retention, bowel incontinence, saddle anesthesia, distal weakness.  Denies IVDU.  States he has been trying ibuprofen with minimal relief. Denies any chest pain or shortness of breath, cough, runny nose. States he has had increased fatigue recently.   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 Authorizing Provider  amlodipine-benazepril (LOTREL) 2.5-10 MG capsule Take 1 capsule by mouth daily. 11/13/21   [provider]  lisinopril (ZESTRIL) 10 MG tablet Take 10 mg by mouth daily.     [provider]  tadalafil (CIALIS) 20 MG tablet TAKE ONE TABLET BY MOUTH DAILY AS NEEDED FOR ERECTILE DYSFUNCTION 07/16/21   [provider]      Allergies    Ciprofloxacin,  Hydrocodone-acetaminophen, Ibuprofen, Penicillins, Propoxyphene n-acetaminophen, and Tramadol hcl    Review of Systems   Review of Systems  Musculoskeletal:  Positive for back pain.    Physical Exam Updated Vital Signs BP (!) 138/96 (BP Location: Right Arm)   Pulse 86   Temp 100 F (37.8 C) (Oral)   Resp 16   Ht 6\' 1"  (1.854 m)   Wt 88.5 kg   SpO2 100%   BMI 25.73 kg/m  Physical Exam Vitals and nursing note reviewed.  Constitutional:      Appearance: Normal appearance.  HENT:     Head: Normocephalic and atraumatic.     Mouth/Throat:     Mouth: Mucous membranes are moist.  Eyes:     General: No scleral icterus. Cardiovascular:     Rate and Rhythm: Normal rate and regular rhythm.     Pulses: Normal pulses.     Heart sounds: Normal heart sounds.  Pulmonary:     Effort: Pulmonary effort is normal.     Breath sounds: Normal breath sounds.  Abdominal:     General: Abdomen is flat.     Palpations: Abdomen is soft.     Tenderness: There is no abdominal tenderness.  Musculoskeletal:        General: No deformity.     Comments: Tenderness to palpation to paraspinal muscles of the low back.  Negative straight leg raise.  Skin:  General: Skin is warm.     Findings: No rash.  Neurological:     General: No focal deficit present.     Mental Status: He is alert.     Comments: Cranial nerves II through XII intact. Intact sensation to light touch in all 4 extremities. 5/5 strength in all 4 extremities. Intact finger-to-nose and heel-to-shin of all 4 extremities. No visual field cuts. No neglect noted. No aphasia noted.   Psychiatric:        Mood and Affect: Mood normal.     ED Results / Procedures / Treatments   Labs (all labs ordered are listed, but only abnormal results are displayed) Labs Reviewed - No data to display  EKG None  Radiology No results found.  Procedures Procedures    Medications Ordered in ED Medications - No data to display  ED Course/  Medical Decision Making/ A&P                             Medical Decision Making Amount and/or Complexity of Data Reviewed Labs: ordered.  Risk Prescription drug management.   This patient presents to the ED for back pain, nausea, this involves an extensive number of treatment options, and is a complaint that carries with a high risk of complications and morbidity.  The differential diagnosis includes UTI, pyelonephritis, renal stone, obstructing stone, infected stone, testicular torsion, hernia, STD, musculoskeletal pain, cauda equina. This is not an exhaustive list.  Lab tests: I ordered and personally interpreted labs.  The pertinent results include: WBC unremarkable. Hbg unremarkable. Platelets unremarkable. Electrolytes unremarkable. BUN, creatinine unremarkable.  UA unremarkable.  Problem list/ ED course/ Critical interventions/ Medical management: HPI: See above Vital signs within normal range and stable throughout visit. Laboratory/imaging studies significant for: See above. On physical examination, patient is afebrile and appears in no acute distress.  There was tenderness to palpation to paraspinal muscles of the lumbar spine.  Symptoms most consistent with musculoskeletal pain versus sciatica.  Less likely sciatica as straight leg raise was negative.  No back pain red flags on physical exam.  Unlikely fracture, dislocation, patient has no recent trauma or bony tenderness to palpation.  Unlikely cauda equina, patient has no urinary incontinence/retention, no saddle anesthesia, no distal weakness.  UA unremarkable, unlikely UTI.  Unlikely pyelonephritis, patient is afebrile and has no CVA tenderness.  Given the clinical picture no indication for imaging at this time.  Given Toradol injection for pain and lidocaine patch.  Reevaluation of patient after these medications showed that the patient improved.  Advised patient to take Tylenol/ibuprofen for pain, follow-up with primary care  physician for reevaluation.  Strict return precaution discussed. I have reviewed the patient home medicines and have made adjustments as needed.  Cardiac monitoring/EKG: The patient was maintained on a cardiac monitor.  I personally reviewed and interpreted the cardiac monitor which showed an underlying rhythm of: sinus rhythm.  Additional history obtained: External records from outside source obtained and reviewed including: Chart review including previous notes, labs, imaging.  Consultations obtained:  Disposition Continued outpatient therapy. Follow-up with PCP recommended for reevaluation of symptoms. Treatment plan discussed with patient.  Pt acknowledged understanding was agreeable to the plan. Worrisome signs and symptoms were discussed with patient, and patient acknowledged understanding to return to the ED if they noticed these signs and symptoms. Patient was stable upon discharge.   This chart was dictated using voice recognition software.  Despite best efforts to  proofread,  errors can occur which can change the documentation meaning.          Final Clinical Impression(s) / ED Diagnoses Final diagnoses:  Acute bilateral low back pain without sciatica  Nausea    Rx / DC Orders ED Discharge Orders          Ordered    ondansetron (ZOFRAN-ODT) 4 MG disintegrating tablet  Every 8 hours PRN        09/14/22 1136              Jeanelle Malling, [place] 09/14/22 1141    Benjiman Core, MD 09/15/22 0700

## 2022-09-20 ENCOUNTER — Ambulatory Visit (HOSPITAL_BASED_OUTPATIENT_CLINIC_OR_DEPARTMENT_OTHER): Payer: BLUE CROSS/BLUE SHIELD | Admitting: Orthopaedic Surgery

## 2022-09-30 ENCOUNTER — Ambulatory Visit (HOSPITAL_BASED_OUTPATIENT_CLINIC_OR_DEPARTMENT_OTHER): Payer: BLUE CROSS/BLUE SHIELD | Admitting: Orthopaedic Surgery

## 2023-01-22 ENCOUNTER — Emergency Department (HOSPITAL_COMMUNITY)
Admission: EM | Admit: 2023-01-22 | Discharge: 2023-01-22 | Discharge status: Against Medical Advice | Payer: BLUE CROSS/BLUE SHIELD

## 2023-01-22 NOTE — ED Notes (Signed)
Patient approached this tech in the lobby stating that he was checking out and could not wait any longer to be triaged. Patient moved off the floor.

## 2023-04-14 ENCOUNTER — Ambulatory Visit: Payer: BLUE CROSS/BLUE SHIELD | Admitting: Orthopedic Surgery

## 2023-04-14 ENCOUNTER — Other Ambulatory Visit (INDEPENDENT_AMBULATORY_CARE_PROVIDER_SITE_OTHER): Payer: BLUE CROSS/BLUE SHIELD

## 2023-04-14 ENCOUNTER — Encounter: Payer: Self-pay | Admitting: Orthopedic Surgery

## 2023-04-14 VITALS — BP 163/96 | HR 116 | Ht 70.0 in | Wt 188.0 lb

## 2023-04-14 DIAGNOSIS — G8929 Other chronic pain: Secondary | ICD-10-CM

## 2023-04-14 DIAGNOSIS — M25561 Pain in right knee: Secondary | ICD-10-CM

## 2023-04-14 MED ORDER — SULINDAC 150 MG PO TABS: 150.0000 mg | ORAL_TABLET | Freq: Two times a day (BID) | ORAL | 0 refills | Status: DC

## 2023-04-14 NOTE — Patient Instructions (Signed)
Physical therapy has been ordered for you at [facility] They should call you to schedule, [phone] is the phone number to call, if you want to call to schedule.

## 2023-04-14 NOTE — Progress Notes (Signed)
  Intake history:  BP (!) 163/96 (Cuff Size: Large)   Pulse (!) 116   Ht 5\' 10"  (1.778 m)   Wt 188 lb (85.3 kg)   BMI 26.98 kg/m  Body mass index is 26.98 kg/m.    WHAT ARE WE SEEING YOU FOR TODAY?   right knee(s)  How long has this bothered you? (DOI?DOS?WS?)  1 month(s) ago  Anticoag.  No  Diabetes No  Heart disease No  Hypertension Yes  SMOKING HX Yes  Kidney disease No  Any ALLERGIES ______________________________________________   Treatment:  Have you taken:  Tylenol Yes  Advil No  Had PT Yes  Had injection No  Other  _________________________

## 2023-04-14 NOTE — Progress Notes (Signed)
  Subjective:     Patient ID: [name], male   DOB: Jun 06, 1980, 43 y.o.   MRN: [id number]  43 year old male with complex medical history please see records for review presents with 1 month history of right knee pain with altered gait which is chronic uses a cane has a decerebrate posturing of his left upper extremity  He denies any history of trauma     Review of Systems  Patient Active Problem List   Diagnosis Date Noted   Ulcer of left lower leg, with necrosis of bone (HCC)    Sepsis (HCC)    Primary hypertension    History of encephalitis 06/23/2019   Leukocytosis 11/17/2018   Osteomyelitis (HCC) 11/16/2018   HCAP (healthcare-associated pneumonia) 06/28/2013   Chest pain, atypical 06/28/2013   SIRS (systemic inflammatory response syndrome) (HCC) 06/28/2013   Hyponatremia 06/28/2013   Headache 01/05/2013   Leg pain 01/05/2013   Dizziness 01/05/2013   ALCOHOL ABUSE, IN REMISSION 06/10/2007   CIGARETTE SMOKER 06/10/2007   DEPRESSION 06/10/2007   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   HEMIPLEGIA, SPASTIC, NONDOMINANT SIDE 06/10/2007   POSTTRAUMATIC WOUND INFECTION NEC 06/10/2007   Tobacco dependence syndrome 06/10/2007        Objective:   Physical Exam Abnormal gait with flexion of both knees  lean forward posture  cane  decerebrate on the left side on the upper extremity  Right knee thickened prepatellar bursa no tenderness no swelling in the joint.  Stable anterior posterior drawer testing.  Tenderness over the patella tendon and distal patella pole.  Extension power on the right was normal        Assessment:     DG Knee AP/LAT W/Sunrise Right Result Date: 04/14/2023 Right knee image Acute pain over 4 weeks AP lateral sunrise Knee appears to be in varus.  Joint space narrowing minimal no osteophytes Mild early degenerative changes       Plan:     PT  NSAIDS  Meds ordered this encounter  Medications   sulindac (CLINORIL) 150 MG tablet    Sig: Take 1  tablet (150 mg total) by mouth 2 (two) times daily.    Dispense:  60 tablet    Refill:  0    Aware of allergy    Allergies  Allergen Reactions   Ciprofloxacin Other (See Comments)    Reaction=hypertension   Hydrocodone-Acetaminophen Hives   Ibuprofen Hives   Penicillins Hives and Nausea Only    Did it involve swelling of the face/tongue/throat, SOB, or low BP? N Did it involve sudden or severe rash/hives, skin peeling, or any reaction on the inside of your mouth or nose? N Did you need to seek medical attention at a hospital or doctor's office? N When did it last happen?      3 months ago If all above answers are "NO", may proceed with cephalosporin use.    Propoxyphene N-Acetaminophen Hives   Tramadol Hcl Hives    F/u none

## 2023-04-28 ENCOUNTER — Ambulatory Visit: Payer: BLUE CROSS/BLUE SHIELD | Admitting: Physical Therapy

## 2023-05-15 ENCOUNTER — Ambulatory Visit: Payer: BLUE CROSS/BLUE SHIELD | Admitting: Orthopedic Surgery

## 2023-05-26 ENCOUNTER — Ambulatory Visit: Payer: BLUE CROSS/BLUE SHIELD | Admitting: Orthopedic Surgery

## 2023-06-05 ENCOUNTER — Ambulatory Visit: Admitting: Orthopedic Surgery

## 2023-06-22 ENCOUNTER — Emergency Department (HOSPITAL_COMMUNITY)
Admission: EM | Admit: 2023-06-22 | Discharge: 2023-06-22 | Disposition: A | Discharge status: Home / Self Care | Payer: MEDICAID

## 2023-06-22 ENCOUNTER — Other Ambulatory Visit: Payer: Self-pay

## 2023-06-22 ENCOUNTER — Emergency Department (HOSPITAL_COMMUNITY): Payer: MEDICAID

## 2023-06-22 ENCOUNTER — Encounter (HOSPITAL_COMMUNITY): Payer: Self-pay | Admitting: Emergency Medicine

## 2023-06-22 DIAGNOSIS — S81002A Unspecified open wound, left knee, initial encounter: Secondary | ICD-10-CM | POA: Diagnosis not present

## 2023-06-22 DIAGNOSIS — Z79899 Other long term (current) drug therapy: Secondary | ICD-10-CM | POA: Insufficient documentation

## 2023-06-22 DIAGNOSIS — W1830XA Fall on same level, unspecified, initial encounter: Secondary | ICD-10-CM | POA: Insufficient documentation

## 2023-06-22 DIAGNOSIS — M868X6 Other osteomyelitis, lower leg: Secondary | ICD-10-CM

## 2023-06-22 DIAGNOSIS — Z8673 Personal history of transient ischemic attack (TIA), and cerebral infarction without residual deficits: Secondary | ICD-10-CM | POA: Insufficient documentation

## 2023-06-22 DIAGNOSIS — I10 Essential (primary) hypertension: Secondary | ICD-10-CM | POA: Insufficient documentation

## 2023-06-22 DIAGNOSIS — M25562 Pain in left knee: Secondary | ICD-10-CM | POA: Diagnosis present

## 2023-06-22 LAB — CBC
HCT: 47.4 % (ref 39.0–52.0)
Hemoglobin: 15.8 g/dL (ref 13.0–17.0)
MCH: 28.6 pg (ref 26.0–34.0)
MCHC: 33.3 g/dL (ref 30.0–36.0)
MCV: 85.7 fL (ref 80.0–100.0)
Platelets: 434 10*3/uL — ABNORMAL HIGH (ref 150–400)
RBC: 5.53 MIL/uL (ref 4.22–5.81)
RDW: 13 % (ref 11.5–15.5)
WBC: 8.1 10*3/uL (ref 4.0–10.5)
nRBC: 0 % (ref 0.0–0.2)

## 2023-06-22 LAB — BASIC METABOLIC PANEL WITH GFR
Anion gap: 14 (ref 5–15)
BUN: 17 mg/dL (ref 6–20)
CO2: 20 mmol/L — ABNORMAL LOW (ref 22–32)
Calcium: 9.2 mg/dL (ref 8.9–10.3)
Chloride: 103 mmol/L (ref 98–111)
Creatinine, Ser: 0.9 mg/dL (ref 0.61–1.24)
GFR, Estimated: >60 mL/min (ref >60)
Glucose, Bld: 88 mg/dL (ref 70–99)
Potassium: 4.3 mmol/L (ref 3.5–5.1)
Sodium: 137 mmol/L (ref 135–145)

## 2023-06-22 LAB — TROPONIN I (HIGH SENSITIVITY): Troponin I (High Sensitivity): 3 ng/L (ref <18)

## 2023-06-22 MED ORDER — GADOBUTROL 1 MMOL/ML IV SOLN
7.0000 mL | Freq: Once | INTRAVENOUS | Status: AC | PRN
  Administered 2023-06-22: 7 mL via INTRAVENOUS

## 2023-06-22 MED ORDER — DOXYCYCLINE HYCLATE 100 MG PO CAPS: 100.0000 mg | ORAL_CAPSULE | Freq: Two times a day (BID) | ORAL | 0 refills | Status: DC

## 2023-06-22 MED ORDER — DOXYCYCLINE HYCLATE 100 MG PO TABS
100.0000 mg | ORAL_TABLET | Freq: Once | ORAL | Status: AC
Start: 2023-06-22 — End: 2023-06-22
  Administered 2023-06-22: 100 mg via ORAL
  Filled 2023-06-22: qty 1

## 2023-06-22 MED ORDER — IBUPROFEN 400 MG PO TABS
600.0000 mg | ORAL_TABLET | Freq: Once | ORAL | Status: AC
  Administered 2023-06-22: 600 mg via ORAL
  Filled 2023-06-22: qty 1

## 2023-06-22 NOTE — ED Provider Notes (Signed)
 [place] EMERGENCY DEPARTMENT AT [facility] Provider Note   CSN: [id number] Arrival date & time: 06/22/23  1134     History  Chief Complaint  Patient presents with   Knee Pain    [name] is a 43 y.o. male with history of hypertension, stroke, osteomyelitis of the left tibial tuberosity requiring debridement in 2021, reports concern for pain in the left knee.  States he has pain ongoing for past couple years, but worsened in the last 2 weeks.  He states that he has had a couple falls and this is caused a wound to form over his left knee.  He reports the falls are from dizziness when getting up too fast.  He denies hitting his head or any loss of consciousness. Denies any chest pain, shortness of breath, or double vision during these dizzy episodes. Denies any fever, chills, or difficulty moving the knee.  He denies any history of diabetes   Knee Pain      Home Medications Prior to Admission medications   Medication Sig Start Date End Date Taking? Authorizing Provider  amlodipine-benazepril (LOTREL) 2.5-10 MG capsule Take 1 capsule by mouth daily. 11/13/21   [provider]  lisinopril (ZESTRIL) 10 MG tablet Take 10 mg by mouth daily.     [provider]  ondansetron (ZOFRAN-ODT) 4 MG disintegrating tablet Take 1 tablet (4 mg total) by mouth every 8 (eight) hours as needed for nausea or vomiting. 09/14/22   Jeanelle Malling, PA  sulindac (CLINORIL) 150 MG tablet Take 1 tablet (150 mg total) by mouth 2 (two) times daily. 04/14/23   Vickki Hearing, MD  tadalafil (CIALIS) 20 MG tablet TAKE ONE TABLET BY MOUTH DAILY AS NEEDED FOR ERECTILE DYSFUNCTION 07/16/21   [provider]      Allergies    Ciprofloxacin, Hydrocodone-acetaminophen, Penicillins, Propoxyphene n-acetaminophen, and Tramadol hcl    Review of Systems   Review of Systems  Skin:  Positive for wound.    Physical Exam Updated Vital Signs BP (!) 134/94 (BP Location: Left Arm)    Pulse 88   Temp 97.7 F (36.5 C) (Oral)   Resp 16   Ht 5\' 10"  (1.778 m)   Wt 85.3 kg   SpO2 100%   BMI 26.98 kg/m  Physical Exam Vitals and nursing note reviewed.  Constitutional:      Appearance: Normal appearance.  HENT:     Head: Atraumatic.  Cardiovascular:     Rate and Rhythm: Normal rate and regular rhythm.  Pulmonary:     Effort: Pulmonary effort is normal.  Musculoskeletal:     Comments: Left lower extremity:  General Open wound overlying the proximal tibia without purulent drainage.  Not able to visualize any bone.  No obvious deformity.  No erythema or edema of the knee  Palpation Mild tenderness to palpation surrounding the wound site/upper tibia Non tender over the femur  ROM Full knee flexion and extension  Sensation: Sensation intact throughout the lower extremity  Strength: 5/5 strength with resisted knee flexion and extension  5/5 strength with resisted ankle plantarflexion and dorsiflexion   Neurological:     General: No focal deficit present.     Mental Status: He is alert.  Psychiatric:        Mood and Affect: Mood normal.        Behavior: Behavior normal.       ED Results / Procedures / Treatments   Labs (all labs ordered are listed, but  only abnormal results are displayed) Labs Reviewed  CBC - Abnormal; Notable for the following components:      Result Value   Platelets 434 (*)    All other components within normal limits  BASIC METABOLIC PANEL WITH GFR - Abnormal; Notable for the following components:   CO2 20 (*)    All other components within normal limits  TROPONIN I (HIGH SENSITIVITY)    EKG None  Radiology DG Knee Complete 4 Views Left Result Date: 06/22/2023 CLINICAL DATA:  Chronic wound. Fall a few weeks ago with infected wound to left knee. EXAM: LEFT KNEE - COMPLETE 4+ VIEW COMPARISON:  11/19/2021. FINDINGS: There is no evidence of acute fracture or dislocation. No joint effusion is seen. There are mild-to-moderate  degenerative changes at the knee with likely chondromalacia patella. A soft tissue defect with air is noted in the anterior aspect of the tibial tuberosity. No radiographic evidence of osteomyelitis is seen. IMPRESSION: 1. No radiographic evidence of osteomyelitis. 2. Mild-to-moderate tricompartmental degenerative changes. Electronically Signed   By: Thornell Sartorius M.D.   On: 06/22/2023 12:54    Procedures Procedures    Medications Ordered in ED Medications  ibuprofen (ADVIL) tablet 600 mg (has no administration in time range)    ED Course/ Medical Decision Making/ A&P                                 Medical Decision Making Amount and/or Complexity of Data Reviewed Radiology: ordered.     Differential diagnosis includes but is not limited to fracture, dislocation, sprain, septic arthritis, osteoarthritis, osteomyelitis, cellulitis, orthostatic hypotension, arrhythmia, ACS, electrolyte abnormality, anemia  ED Course:   Labs Ordered: I Ordered, and personally interpreted labs.  The pertinent results include:   CBC without leukocytosis BMP without electrolytes Troponin of 3  Imaging Studies ordered: I ordered imaging studies including x-ray left knee I independently visualized the imaging with scope of interpretation limited to determining acute life threatening conditions related to emergency care. Imaging showed tricompartmental arthritic changes without evidence of osteomyelitis I agree with the radiologist interpretation   Cardiac Monitoring: / EKG: The patient was maintained on a cardiac monitor.  I personally viewed and interpreted the cardiac monitored which showed an underlying rhythm of: Normal sinus rhythm, no ST changes  Medications Given: Ibuprofen for pain  Upon initial evaluation, patient is well-appearing, stable vital signs.  No tachycardia or fever.  He has a obvious wound overlying the left proximal tibia, no bone visualized.  No frank purulence.  States  this has been there for the past 2 weeks.  He has full range of motion of the left knee, no erythema or warmth to the knee, low concern for septic arthritis at this time.  X-ray without any signs of osteomyelitis, no fractures or dislocations. However, given his history of osteomyelitis in the left tibia previously, will obtain MRI of the left knee to rule out osteomyelitis or infection that would need to be treated. Regarding patient's dizziness, he states this occurs when he goes to stand up.  He is not having any chest pain or shortness of breath, troponin normal at 3, low concern for ACS.  EKG with normal sinus rhythm, low concern for arrhythmia.  He is not having any double vision or neurological deficits to suggest central etiology.  Labs without anemia or electrolyte abnormality.  Feel his dizzy spells are likely related to orthostatic hypotension.  Impression: Left knee wound and pain  Disposition:  Care of this patient signed out to oncoming ED provider Britni Henderly, PA-C to follow-up on MRI results.  If there are signs of osteomyelitis, will need to reach out to orthopedics. Disposition and treatment plan pending imaging results and clinical judgment of oncoming ED team.      Record Review: External records from outside source obtained and reviewed including ER visits for osteomyelitis in 01/06/2020 and 11/16/2018     This chart was dictated using voice recognition software, Dragon. Despite the best efforts of this provider to proofread and correct errors, errors may still occur which can change documentation meaning.          Final Clinical Impression(s) / ED Diagnoses Final diagnoses:  Open knee wound, left, initial encounter    Rx / DC Orders ED Discharge Orders     None         Arabella Merles, PA-C 06/22/23 1513    Durwin Glaze, MD 06/22/23 [phone]

## 2023-06-22 NOTE — ED Triage Notes (Signed)
 BIB [facility] from home with complaints of fever, pain and hole in left knee. Denies any drainage. Seen for the same 2-3 weeks ago and had it debrided. Complaints of frequent falls.

## 2023-06-22 NOTE — ED Provider Notes (Signed)
 Care assumed from previous provider.  See note for full HPI.  In summation patient here for evaluation of wound, chronic in nature however has had increased pain over the last few days.  Afebrile, nonseptic, non-ill-appearing.  No leukocytosis here.  Does not meet SIRS criteria.  Apt with Lajoyce Corners tomorrow morning.  Physical Exam  BP (!) 124/90 (BP Location: Right Arm)   Pulse 77   Temp 98 F (36.7 C) (Oral)   Resp 16   Ht 5\' 10"  (1.778 m)   Wt 85.3 kg   SpO2 100%   BMI 26.98 kg/m   Physical Exam Vitals and nursing note reviewed.  Constitutional:      General: He is not in acute distress.    Appearance: He is well-developed. He is not ill-appearing, toxic-appearing or diaphoretic.  HENT:     Head: Atraumatic.  Eyes:     Pupils: Pupils are equal, round, and reactive to light.  Cardiovascular:     Rate and Rhythm: Normal rate and regular rhythm.     Pulses: Normal pulses.     Heart sounds: Normal heart sounds.  Pulmonary:     Effort: Pulmonary effort is normal. No respiratory distress.     Breath sounds: Normal breath sounds.  Abdominal:     General: Bowel sounds are normal. There is no distension.     Palpations: Abdomen is soft.  Musculoskeletal:        General: Normal range of motion.     Cervical back: Normal range of motion and neck supple.     Comments: Crusted wound to left anterior knee.  No fluctuance induration.  No drainage.  Skin:    General: Skin is warm and dry.     Capillary Refill: Capillary refill takes less than 2 seconds.  Neurological:     General: No focal deficit present.     Mental Status: He is alert and oriented to person, place, and time.     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 EXAM: MRI OF LOWER LEFT EXTREMITY WITHOUT AND WITH CONTRAST TECHNIQUE: Multiplanar, multisequence MR imaging of the left lower extremity was performed both before and after administration of intravenous contrast. CONTRAST:  7mL GADAVIST GADOBUTROL 1 MMOL/ML IV SOLN COMPARISON:  Radiographs 06/22/2023.  Prior MRI from 2020 FINDINGS: Chronic open wound involving the anterior aspect of the upper tibial region just below the knee joint. This involves the distal aspect of the patellar tendon which demonstrates significant septic tendinopathy and longitudinal split type tear/central defect. Abnormal T1 and T2 signal intensity in the proximal tibia along the tibial tubercle with subsequent contrast enhancement consistent with osteomyelitis. There is also extensive enhancing granulation tissue around the wound without discrete drainable soft tissue abscess. Chronic changes involving the patella possibly related to prior large osteochondral lesion, bone infarct or infection. No findings suspicious for septic arthritis involving the knee joint. No findings for internal derangement. IMPRESSION:  1. Chronic open wound involving the anterior aspect of the upper tibial region just below the knee joint. This involves the distal aspect of the patellar tendon which demonstrates significant septic tendinopathy and longitudinal split type tear/central defect. 2. Osteomyelitis involving the proximal tibia along the tibial tubercle. 3. Extensive enhancing granulation tissue around without discrete drainable soft tissue abscess. 4. Chronic changes involving the patella possibly related to prior large osteochondral lesion, bone infarct or infection. 5. No findings suspicious for  septic arthritis involving the knee joint. Electronically Signed   By: Rudie Meyer M.D.   On: 06/22/2023 19:12   DG Knee Complete 4 Views Left Result Date: 06/22/2023 CLINICAL DATA:  Chronic wound. Fall a few weeks ago with infected wound to left knee. EXAM: LEFT KNEE - COMPLETE 4+ VIEW COMPARISON:  11/19/2021. FINDINGS: There is no evidence of acute fracture or dislocation. No joint effusion is seen. 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 A soft tissue defect with air is noted in the anterior aspect of the tibial tuberosity. No radiographic evidence of osteomyelitis is seen. IMPRESSION: 1. No radiographic evidence of osteomyelitis. 2. Mild-to-moderate tricompartmental degenerative changes. Electronically Signed   By: Thornell Sartorius M.D.   On: 06/22/2023 12:54    ED Course / MDM   Clinical Course as of 06/22/23 2346  Sun Jun 22, 2023  1523 Left knee pain, wound to left knee for 2 weeks. Falls. FU on imaging, if neg dc home [BH]  2012 Discussed with Dr. Magnus Ivan.  Patient does not need admission as this is all chronic.  Patient can follow with Dr. Lajoyce Corners tomorrow morning as previously planned. [BH]    Clinical Course User Index [BH] Delmon Andrada A, PA-C   Care assumed from previous provider.  See note for full HPI.  In summation patient here for evaluation of wound, chronic in nature however has had increased pain over the last few days.  Afebrile, nonseptic, non-ill-appearing.  No leukocytosis here.  Does not meet SIRS criteria.  Apt with Lajoyce Corners tomorrow morning.  Labs and imaging personally viewed interpreted   Discussed with Dr. Magnus Ivan on-call for Dr. Audrie Lia group.  States patient does not need admission as this seems chronic in nature.  He recommends follow-up with Dr. Lajoyce Corners tomorrow morning as patient already has a scheduled appointment.  I did give patient a dose of doxycycline here in the emergency department.  He has been  here 9 hours without fever.  He actually states he prefers outpatient follow-up as he has to get his grandchild on the bus tomorrow morning and cannot stay.  I discussed close follow-up outpatient, return for any worsening symptoms.   Medical Decision Making Amount and/or Complexity of Data Reviewed External Data Reviewed: labs, radiology and notes. Labs: ordered. Decision-making details documented in ED Course. Radiology: ordered and independent interpretation performed. Decision-making details documented in ED Course.  Risk OTC drugs. Prescription drug management. Decision regarding hospitalization. Diagnosis or treatment significantly limited by social determinants of health.          Lashanta Elbe A, PA-C 06/22/23 2346    Ernie Avena, MD 06/23/23 0157

## 2023-06-22 NOTE — Discharge Instructions (Signed)
 We discussed this with an orthopedic surgeon who works in the same group as Dr. Lajoyce Corners.  Keep your appointment tomorrow morning with him.  We have started you on oral antibiotics.  Follow-up outpatient, return for any worsening symptoms

## 2023-06-22 NOTE — ED Provider Triage Note (Signed)
 Emergency Medicine Provider Triage Evaluation Note  [name] , a 43 y.o. male  was evaluated in triage.  Pt complains of frequent falls, dizziness, pain and chronic wound to the left knee contributing.  Patient reports that the major reason he feels like he is falling is because of dizziness, weakness,.  Review of Systems  Positive: Knee pain, dizziness, falls Negative: Chest pain, shob  Physical Exam  BP (!) 134/94 (BP Location: Left Arm)   Pulse 88   Temp 97.7 F (36.5 C) (Oral)   Resp 16   Ht 5\' 10"  (1.778 m)   Wt 85.3 kg   SpO2 100%   BMI 26.98 kg/m  Gen:   Awake, no distress   Resp:  Normal effort  MSK:   Moves extremities without difficulty  Other:  Large chronic appearing dry gangrenous wound on anterior left knee with some serous drainage  Medical Decision Making  Medically screening exam initiated at 11:46 AM.  Appropriate orders placed.  [name] was informed that the remainder of the evaluation will be completed by another provider, this initial triage assessment does not replace that evaluation, and the importance of remaining in the ED until their evaluation is complete.  Workup initiated in triage    Olene Floss, PA-C 06/22/23 1150

## 2023-06-23 ENCOUNTER — Encounter: Payer: Self-pay | Admitting: Orthopedic Surgery

## 2023-06-23 ENCOUNTER — Ambulatory Visit (INDEPENDENT_AMBULATORY_CARE_PROVIDER_SITE_OTHER): Payer: MEDICAID | Admitting: Orthopedic Surgery

## 2023-06-23 DIAGNOSIS — M86462 Chronic osteomyelitis with draining sinus, left tibia and fibula: Secondary | ICD-10-CM | POA: Diagnosis not present

## 2023-06-23 NOTE — Progress Notes (Signed)
 Office Visit Note   Patient: [name]           Date of Birth: 12/16/1980           MRN: [id number] Visit Date: 06/23/2023              Requested by: Verlon Au, MD [address] [place],  [place] [address] PCP: Verlon Au, MD  Chief Complaint  Patient presents with   Left Knee - Wound Check      HPI: Patient is a 43 year old gentleman who is seen for evaluation for ulceration over the left tibial tubercle.  Patient states this started 2 weeks ago when he fell on his knee in the bedroom.  Patient is status post debridement for osteomyelitis of the same tibial tubercle October 2021.  Assessment & Plan: Visit Diagnoses:  1. 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  Follow-Up Instructions: Return in about 2 weeks (around 07/07/2023).   Ortho Exam  Patient is alert, oriented, no adenopathy, well-dressed, normal affect, normal respiratory effort. Examination patient has a chronic ulcer over the tibial tubercle with hypertrophic tissue a ulcer that probes to bone with sclerotic bone at the base of the wound.  There is no purulent drainage no surrounding cellulitis.  Review of the radiographs shows destructive bony changes of the tibial tubercle.  Review of the MRI scan shows osteomyelitis of the tibial tubercle with the chronic sinus draining tract.  Imaging: MR TIBIA FIBULA LEFT W WO CONTRAST Result Date: 06/22/2023 CLINICAL DATA:  Open wound involving the anterior aspect of the proximal tibia. EXAM: MRI OF LOWER LEFT EXTREMITY WITHOUT AND WITH CONTRAST TECHNIQUE: Multiplanar, multisequence MR imaging of the left lower extremity was performed both before and  after administration of intravenous contrast. CONTRAST:  7mL GADAVIST GADOBUTROL 1 MMOL/ML IV SOLN COMPARISON:  Radiographs 06/22/2023.  Prior MRI from 2020 FINDINGS: Chronic open wound involving the anterior aspect of the upper tibial region just below the knee joint. This involves the distal aspect of the patellar tendon which demonstrates significant septic tendinopathy and longitudinal split type tear/central defect. Abnormal T1 and T2 signal intensity in the proximal tibia along the tibial tubercle with subsequent contrast enhancement consistent with osteomyelitis. There is also extensive enhancing granulation tissue around the wound without discrete drainable soft tissue abscess. Chronic changes involving the patella possibly related to prior large osteochondral lesion, bone infarct or infection. No findings suspicious for septic arthritis involving the knee joint. No findings for internal derangement. IMPRESSION: 1. Chronic open wound involving the anterior aspect of the upper tibial region just below the knee joint. This involves the distal aspect of the patellar tendon which demonstrates significant septic tendinopathy and longitudinal split type tear/central defect. 2. Osteomyelitis involving the proximal tibia along the tibial tubercle. 3. Extensive enhancing granulation tissue around without discrete drainable soft tissue abscess. 4. Chronic changes involving the patella possibly related to prior large osteochondral lesion, bone infarct or infection. 5. No findings suspicious for septic arthritis involving the knee joint. Electronically Signed   By: Rudie Meyer M.D.   On: 06/22/2023 19:12   DG Knee Complete 4 Views Left Result Date: 06/22/2023 CLINICAL DATA:  Chronic wound. Fall a few weeks  ago with infected wound to left knee. EXAM: LEFT KNEE - COMPLETE 4+ VIEW COMPARISON:  11/19/2021. FINDINGS: There is no evidence of acute fracture or dislocation. No joint effusion is seen. 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 A soft tissue defect with air is noted in the anterior aspect of the tibial tuberosity. No radiographic evidence of osteomyelitis is seen. IMPRESSION: 1. No radiographic evidence of osteomyelitis. 2. Mild-to-moderate tricompartmental degenerative changes. Electronically Signed   By: Thornell Sartorius M.D.   On: 06/22/2023 12:54   No images are attached to the encounter.  Labs: Lab Results  Component Value Date   ESRSEDRATE 17 (H) 01/08/2020   ESRSEDRATE 47 (H) 11/16/2018   ESRSEDRATE 10 05/05/2008   CRP 6.7 (H) 01/08/2020   CRP 13.9 (H) 11/16/2018   REPTSTATUS 01/17/2020 FINAL 01/12/2020   GRAMSTAIN  01/12/2020    FEW WBC PRESENT, PREDOMINANTLY PMN NO ORGANISMS SEEN    CULT  01/12/2020    No growth aerobically or anaerobically. Performed at [facility], [address] [place], [place] [address]    LABORGA STAPHYLOCOCCUS HAEMOLYTICUS (A) 01/06/2020     Lab Results  Component Value Date   ALBUMIN 3.5 09/14/2022   ALBUMIN 3.1 (L) 01/08/2020   ALBUMIN 3.5 11/17/2018    No results found for: "MG" No results found for: "VD25OH"  No results found for: "PREALBUMIN"    Latest Ref Rng & Units 06/22/2023   11:47 AM 09/14/2022    8:25 AM 01/11/2020    8:53 AM  CBC EXTENDED  WBC 4.0 - 10.5 K/uL 8.1  10.3  7.4   RBC 4.22 - 5.81 MIL/uL 5.53  5.19  5.41   Hemoglobin 13.0 - 17.0 g/dL 19.1  47.8  29.5   HCT 39.0 - 52.0 % 47.4  44.3  45.4   Platelets 150 - 400 K/uL 434  365  467   NEUT# 1.7 - 7.7 K/uL  7.3    Lymph# 0.7 - 4.0 K/uL  1.7       There is no height or weight on file to calculate BMI.  Orders:  No orders of the defined types were placed in this encounter.  No orders of the defined types were placed in this encounter.    Procedures: No procedures performed  Clinical Data: No additional findings.  ROS:  All other systems negative, except as noted in the HPI. Review of  Systems  Objective: Vital Signs: There were no vitals taken for this visit.  Specialty Comments:  No specialty comments available.  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   CIGARETTE SMOKER 06/10/2007   DEPRESSION 06/10/2007   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   HEMIPLEGIA, SPASTIC, NONDOMINANT SIDE 06/10/2007   POSTTRAUMATIC WOUND INFECTION NEC 06/10/2007   Tobacco dependence syndrome 06/10/2007   Past Medical History:  Diagnosis Date   Depression    Encephalitis    Hypertension    Meningitis    Paralysis, 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   Stroke [facility])     Family History  Problem Relation Age of Onset   Hypertension Mother    Hypertension Father     Past Surgical History:  Procedure Laterality Date   I & D EXTREMITY Left 01/12/2020   Procedure: EXCISION DEBRIDEMENT LEFT TIBIAL TUBERCLE, PLACE ANTIBIOTIC BEADS;  Surgeon: Nadara Mustard, MD;  Location: [facility] OR;  Service: Orthopedics;  Laterality: Left;   JOINT REPLACEMENT     KNEE SURGERY     Social History   Occupational History   Not on file  Tobacco Use   Smoking status: Every Day    Current packs/day: 0.25    Types: Cigarettes   Smokeless tobacco: Never  Vaping Use   Vaping status: Never Used  Substance and Sexual Activity   Alcohol use: No   Drug use: Yes    Types: Marijuana    Comment: not used in 3 weeks   Sexual activity: Not on file

## 2023-07-10 ENCOUNTER — Emergency Department (HOSPITAL_COMMUNITY): Payer: MEDICAID

## 2023-07-10 ENCOUNTER — Inpatient Hospital Stay (HOSPITAL_COMMUNITY)
Admission: EM | Admit: 2023-07-10 | Discharge: 2023-07-12 | DRG: 541 | Disposition: A | Discharge status: Home / Self Care | Payer: MEDICAID | Attending: Internal Medicine | Admitting: Internal Medicine

## 2023-07-10 ENCOUNTER — Other Ambulatory Visit: Payer: Self-pay

## 2023-07-10 ENCOUNTER — Encounter (HOSPITAL_COMMUNITY): Payer: Self-pay | Admitting: Internal Medicine

## 2023-07-10 DIAGNOSIS — Z8249 Family history of ischemic heart disease and other diseases of the circulatory system: Secondary | ICD-10-CM

## 2023-07-10 DIAGNOSIS — Z881 Allergy status to other antibiotic agents status: Secondary | ICD-10-CM | POA: Diagnosis not present

## 2023-07-10 DIAGNOSIS — M25562 Pain in left knee: Secondary | ICD-10-CM | POA: Diagnosis present

## 2023-07-10 DIAGNOSIS — R509 Fever, unspecified: Secondary | ICD-10-CM | POA: Diagnosis present

## 2023-07-10 DIAGNOSIS — Z880 Allergy status to penicillin: Secondary | ICD-10-CM

## 2023-07-10 DIAGNOSIS — M86462 Chronic osteomyelitis with draining sinus, left tibia and fibula: Secondary | ICD-10-CM | POA: Diagnosis not present

## 2023-07-10 DIAGNOSIS — R531 Weakness: Secondary | ICD-10-CM | POA: Diagnosis present

## 2023-07-10 DIAGNOSIS — Z885 Allergy status to narcotic agent status: Secondary | ICD-10-CM

## 2023-07-10 DIAGNOSIS — F32A Depression, unspecified: Secondary | ICD-10-CM | POA: Diagnosis present

## 2023-07-10 DIAGNOSIS — Z8673 Personal history of transient ischemic attack (TIA), and cerebral infarction without residual deficits: Secondary | ICD-10-CM | POA: Diagnosis not present

## 2023-07-10 DIAGNOSIS — Z79899 Other long term (current) drug therapy: Secondary | ICD-10-CM | POA: Diagnosis not present

## 2023-07-10 DIAGNOSIS — I10 Essential (primary) hypertension: Secondary | ICD-10-CM | POA: Diagnosis present

## 2023-07-10 DIAGNOSIS — F17210 Nicotine dependence, cigarettes, uncomplicated: Secondary | ICD-10-CM | POA: Diagnosis present

## 2023-07-10 DIAGNOSIS — M869 Osteomyelitis, unspecified: Principal | ICD-10-CM | POA: Diagnosis present

## 2023-07-10 LAB — CBC WITH DIFFERENTIAL/PLATELET
Abs Immature Granulocytes: 0.05 10*3/uL (ref 0.00–0.07)
Basophils Absolute: 0.1 10*3/uL (ref 0.0–0.1)
Basophils Relative: 1 %
Eosinophils Absolute: 0.1 10*3/uL (ref 0.0–0.5)
Eosinophils Relative: 1 %
HCT: 47.9 % (ref 39.0–52.0)
Hemoglobin: 16.2 g/dL (ref 13.0–17.0)
Immature Granulocytes: 0 %
Lymphocytes Relative: 20 %
Lymphs Abs: 2.3 10*3/uL (ref 0.7–4.0)
MCH: 29 pg (ref 26.0–34.0)
MCHC: 33.8 g/dL (ref 30.0–36.0)
MCV: 85.7 fL (ref 80.0–100.0)
Monocytes Absolute: 0.9 10*3/uL (ref 0.1–1.0)
Monocytes Relative: 8 %
Neutro Abs: 8 10*3/uL — ABNORMAL HIGH (ref 1.7–7.7)
Neutrophils Relative %: 70 %
Platelets: 420 10*3/uL — ABNORMAL HIGH (ref 150–400)
RBC: 5.59 MIL/uL (ref 4.22–5.81)
RDW: 12.9 % (ref 11.5–15.5)
WBC: 11.5 10*3/uL — ABNORMAL HIGH (ref 4.0–10.5)
nRBC: 0 % (ref 0.0–0.2)

## 2023-07-10 LAB — COMPREHENSIVE METABOLIC PANEL WITH GFR
ALT: 20 U/L (ref 0–44)
AST: 20 U/L (ref 15–41)
Albumin: 4.5 g/dL (ref 3.5–5.0)
Alkaline Phosphatase: 87 U/L (ref 38–126)
Anion gap: 9 (ref 5–15)
BUN: 14 mg/dL (ref 6–20)
CO2: 29 mmol/L (ref 22–32)
Calcium: 9.7 mg/dL (ref 8.9–10.3)
Chloride: 100 mmol/L (ref 98–111)
Creatinine, Ser: 0.92 mg/dL (ref 0.61–1.24)
GFR, Estimated: >60 mL/min (ref >60)
Glucose, Bld: 97 mg/dL (ref 70–99)
Potassium: 4.3 mmol/L (ref 3.5–5.1)
Sodium: 138 mmol/L (ref 135–145)
Total Bilirubin: 0.9 mg/dL (ref 0.0–1.2)
Total Protein: 8.4 g/dL — ABNORMAL HIGH (ref 6.5–8.1)

## 2023-07-10 MED ORDER — SODIUM CHLORIDE 0.9 % IV SOLN
2.0000 g | Freq: Once | INTRAVENOUS | Status: AC
  Administered 2023-07-10: 2 g via INTRAVENOUS
  Filled 2023-07-10: qty 12.5

## 2023-07-10 MED ORDER — FENTANYL CITRATE PF 50 MCG/ML IJ SOSY: 12.5000 ug | PREFILLED_SYRINGE | INTRAMUSCULAR | Status: DC | PRN

## 2023-07-10 MED ORDER — VANCOMYCIN HCL 1500 MG/300ML IV SOLN
1500.0000 mg | Freq: Two times a day (BID) | INTRAVENOUS | Status: DC
  Administered 2023-07-11 – 2023-07-12 (×3): 1500 mg via INTRAVENOUS
  Filled 2023-07-10 (×6): qty 300

## 2023-07-10 MED ORDER — SODIUM CHLORIDE 0.9 % IV SOLN
2.0000 g | Freq: Three times a day (TID) | INTRAVENOUS | Status: DC
  Administered 2023-07-11 – 2023-07-12 (×4): 2 g via INTRAVENOUS
  Filled 2023-07-10 (×4): qty 12.5

## 2023-07-10 MED ORDER — VANCOMYCIN HCL IN DEXTROSE 1-5 GM/200ML-% IV SOLN
1000.0000 mg | Freq: Once | INTRAVENOUS | Status: AC
  Administered 2023-07-10: 1000 mg via INTRAVENOUS
  Filled 2023-07-10: qty 200

## 2023-07-10 MED ORDER — SODIUM CHLORIDE 0.9 % IV SOLN: INTRAVENOUS | Status: AC

## 2023-07-10 MED ORDER — IBUPROFEN 200 MG PO TABS: 400.0000 mg | ORAL_TABLET | Freq: Four times a day (QID) | ORAL | Status: DC | PRN

## 2023-07-10 MED ORDER — NALOXONE HCL 0.4 MG/ML IJ SOLN: 0.4000 mg | INTRAMUSCULAR | Status: DC | PRN

## 2023-07-10 MED ORDER — OXYCODONE HCL 5 MG PO TABS: 5.0000 mg | ORAL_TABLET | Freq: Four times a day (QID) | ORAL | Status: DC | PRN

## 2023-07-10 MED ORDER — SODIUM CHLORIDE 0.9 % IV BOLUS
1000.0000 mL | Freq: Once | INTRAVENOUS | Status: AC
  Administered 2023-07-10: 1000 mL via INTRAVENOUS

## 2023-07-10 MED ORDER — HYDRALAZINE HCL 20 MG/ML IJ SOLN: 5.0000 mg | Freq: Four times a day (QID) | INTRAMUSCULAR | Status: DC | PRN

## 2023-07-10 NOTE — H&P (Signed)
 History and Physical    [name] WUJ:[id number] DOB: May 06, 1980 DOA: 07/10/2023  PCP: Jacqulyne Maxim, MD  Patient coming from: Home  Chief Complaint: Left knee wound  HPI: [name] is a 43 y.o. male with medical history significant of hypertension, meningitis/encephalitis, CVA, depression, marijuana and tobacco abuse, history of surgical debridement for osteomyelitis of left tibial tubercle with placement of antibiotic beads by Dr. Julio Ohm in October 2021.   MRI of the left knee done on 06/22/2023 showing: "IMPRESSION: 1. Chronic open wound involving the anterior aspect of the upper tibial region just below the knee joint. This involves the distal aspect of the patellar tendon which demonstrates significant septic tendinopathy and longitudinal split type tear/central defect. 2. Osteomyelitis involving the proximal tibia along the tibial tubercle. 3. Extensive enhancing granulation tissue around without discrete drainable soft tissue abscess. 4. Chronic changes involving the patella possibly related to prior large osteochondral lesion, bone infarct or infection. 5. No findings suspicious for septic arthritis involving the knee joint."  Patient was recently seen by Dr. 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  He has been on doxycycline  for the past 2 weeks.  Patient presented to the ED today due to concern for increasing pain in his left knee and purulent drainage from his left tibia sinus tract despite taking oral doxycycline  for over 2 weeks.  Reports subjective fevers and chills.  Denies shortness of breath or chest pain.  No other complaints.  ED Course: Afebrile.  WBC count 11.5, blood cultures collected.  X-ray of the left knee showing osteomyelitis of the anterior tibial  tuberosity. Patient was given vancomycin , cefepime , and 1 L normal saline. ED physician discussed the case with Dr. Hermina Loosen with orthopedics who recommended continuing IV antibiotics and transferring the patient to [facility] for medical admission.  Dr. Julio Ohm will perform surgery tomorrow.  Review of Systems:  Review of Systems  All other systems reviewed and are negative.   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 He has never used smokeless tobacco. He reports current drug use. Drug: Marijuana. He reports that he does not drink alcohol.  Allergies  Allergen Reactions   Ciprofloxacin Other (See Comments)    Reaction=hypertension   Hydrocodone -Acetaminophen  Hives   Penicillins Hives and Nausea Only    Did it involve swelling of the face/tongue/throat, SOB, or low BP? N Did it involve sudden or severe rash/hives, skin peeling, or any reaction on the inside of your mouth or nose? N Did you need to seek medical attention at a hospital or doctor's office? N When did it last happen?      3 months ago If all above answers are "NO", may proceed with cephalosporin use.  Propoxyphene N-Acetaminophen  Hives   Tramadol Hcl Hives    Family History  Problem Relation Age of Onset   Hypertension Mother    Hypertension Father     Prior to Admission medications   Medication Sig Start Date End Date Taking? Authorizing Provider  amlodipine-benazepril (LOTREL) 2.5-10 MG capsule Take  1 capsule by mouth daily. 11/13/21   [provider]  doxycycline  (VIBRAMYCIN ) 100 MG capsule Take 1 capsule (100 mg total) by mouth 2 (two) times daily. 06/22/23   Henderly, Britni A, PA-C  lisinopril  (ZESTRIL ) 10 MG tablet Take 10 mg by mouth daily.     [provider]  ondansetron  (ZOFRAN -ODT) 4 MG disintegrating tablet Take 1 tablet (4 mg total) by mouth every 8 (eight) hours as needed for nausea or vomiting. 09/14/22   Thomes Flicker, PA  sulindac  (CLINORIL ) 150 MG tablet Take 1 tablet (150 mg total) by mouth 2 (two) times daily. 04/14/23   Darrin Emerald, MD  tadalafil (CIALIS) 20 MG tablet TAKE ONE TABLET BY MOUTH DAILY AS NEEDED FOR ERECTILE DYSFUNCTION 07/16/21   [provider]    Physical Exam: Vitals:   07/10/23 1858 07/10/23 1900  BP: (!) 137/103   Pulse: 95   Resp:  15  Temp: (!) 97.5 F (36.4 C)   TempSrc: Oral   SpO2: 100%     Physical Exam Vitals reviewed.  Constitutional:      General: He is not in acute distress. HENT:     Head: Normocephalic and atraumatic.  Eyes:     Extraocular Movements: Extraocular movements intact.  Cardiovascular:     Rate and Rhythm: Normal rate and regular rhythm.     Pulses: Normal pulses.  Pulmonary:     Effort: Pulmonary effort is normal. No respiratory distress.     Breath sounds: Normal breath sounds. No wheezing or rales.  Abdominal:     General: Bowel sounds are normal. There is no distension.     Palpations: Abdomen is soft.     Tenderness: There is no abdominal tenderness. There is no guarding.  Musculoskeletal:     Cervical back: Normal range of motion.     Comments: Left knee: See image.  Skin:    General: Skin is warm and dry.  Neurological:     Mental Status: He is alert and oriented to person, place, and time.       Labs on Admission: I have personally reviewed following labs and imaging studies  CBC: Recent Labs  Lab 07/10/23 2007  WBC 11.5*  NEUTROABS 8.0*  HGB 16.2  HCT 47.9   MCV 85.7  PLT 420*   Basic Metabolic Panel: Recent Labs  Lab 07/10/23 2007  NA 138  K 4.3  CL 100  CO2 29  GLUCOSE 97  BUN 14  CREATININE 0.92  CALCIUM 9.7   GFR: CrCl cannot be calculated (Unknown ideal weight.). Liver Function Tests: Recent Labs  Lab 07/10/23 2007  AST 20  ALT 20  ALKPHOS 87  BILITOT 0.9  PROT 8.4*  ALBUMIN 4.5   No results for input(s): "LIPASE", "AMYLASE" in the last 168 hours. No results for input(s): "AMMONIA" in the last 168 hours. Coagulation Profile: No results for input(s): "INR", "PROTIME" in the last 168 hours. Cardiac Enzymes: No results for input(s): "CKTOTAL", "CKMB", "CKMBINDEX", "TROPONINI" in the last 168 hours. BNP (last 3 results) No results for input(s): "PROBNP" in the last 8760 hours. HbA1C: No results for input(s): "HGBA1C" in the last 72 hours. CBG:  No results for input(s): "GLUCAP" in the last 168 hours. Lipid Profile: No results for input(s): "CHOL", "HDL", "LDLCALC", "TRIG", "CHOLHDL", "LDLDIRECT" in the last 72 hours. Thyroid Function Tests: No results for input(s): "TSH", "T4TOTAL", "FREET4", "T3FREE", "THYROIDAB" in the last 72 hours. Anemia Panel: No results for input(s): "VITAMINB12", "FOLATE", "FERRITIN", "TIBC", "IRON", "RETICCTPCT" in the last 72 hours. Urine analysis:    Component Value Date/Time   COLORURINE YELLOW 09/14/2022 1015   APPEARANCEUR CLEAR 09/14/2022 1015   LABSPEC 1.021 09/14/2022 1015   PHURINE 6.0 09/14/2022 1015   GLUCOSEU NEGATIVE 09/14/2022 1015   HGBUR SMALL (A) 09/14/2022 1015   HGBUR negative 07/28/2007 1039   BILIRUBINUR NEGATIVE 09/14/2022 1015   KETONESUR 5 (A) 09/14/2022 1015   PROTEINUR NEGATIVE 09/14/2022 1015   UROBILINOGEN 1.0 06/28/2013 1910   NITRITE NEGATIVE 09/14/2022 1015   LEUKOCYTESUR NEGATIVE 09/14/2022 1015    Radiological Exams on Admission: DG Knee Complete 4 Views Left Result Date: 07/10/2023 CLINICAL DATA:  Knee pain, wound drainage. EXAM: LEFT KNEE -  COMPLETE 4+ VIEW COMPARISON:  Left knee x-ray 06/22/2023. left knee x-ray 11/19/2021. FINDINGS: There is no acute fracture or dislocation. Joint spaces are maintained. There is soft tissue swelling and ulceration overlying the anterior tibial tuberosity. There is underlying cortical thickening and irregularity with some erosive change similar to the prior study. There is no significant joint effusion. IMPRESSION: Soft tissue swelling and ulceration overlying the anterior tibial tuberosity with underlying cortical thickening and irregularity with some erosive change similar to the prior study. Findings are concerning for osteomyelitis. Consider further evaluation with MRI to conserve firm. Electronically Signed   By: Tyron Gallon M.D.   On: 07/10/2023 20:29    Assessment and Plan  Chronic osteomyelitis of the left proximal tibia with draining sinus tract Failed outpatient antibiotic therapy with doxycycline .  WBC count 11.5, no fever or signs of sepsis.  Dr. Julio Ohm planning on excisional debridement of the tibia with placement of antibiotic tissue tomorrow at [facility].  Keep n.p.o. after midnight, maintenance IV fluids.  Continue vancomycin  and cefepime .  Follow-up blood cultures.  Continue pain management.  Hypertension Pharmacy med rec pending.  IV hydralazine  PRN SBP >160.  History of CVA Depression Pharmacy med rec pending.  DVT prophylaxis: SCDs Code Status: Full Code (discussed with the patient) Family Communication: No family available at this time. Consults called: Orthopedics Level of care: Telemetry bed Admission status: It is my clinical opinion that admission to INPATIENT is reasonable and necessary because of the expectation that this patient will require hospital care that crosses at least 2 midnights to treat this condition based on the medical complexity of the problems presented.  Given the aforementioned information, the predictability of an adverse outcome is felt to  be significant.  Juliette Oh MD [facility]  If 7PM-7AM, please contact night-coverage [email or web address]  07/10/2023, 9:30 PM

## 2023-07-10 NOTE — Progress Notes (Signed)
 Pharmacy Antibiotic Note  [name] is a 43 y.o. male admitted on 07/10/2023 with chronic  osteomyelitis  of left proximal tibia with draining sinus tract.  Has been on doxycycline  as outpatient with worsening sx.  Pharmacy has been consulted for Cefepime  + Vancomycin  dosing. Noted plans for debridement in OR tomorrow.   Patient reports weight as 184 lbs (83.6kg).   Estimated CrCl using this weight >21ml/min.   Plan: Cefepime  2gm IV q8h Vancomycin  1500mg  IV q12h to target AUC 400-550.   Estimated AUC on this regimen = 540. Monitor renal function and cx data  Check levels as needed     Temp (24hrs), Avg:97.6 F (36.4 C), Min:97.5 F (36.4 C), Max:97.7 F (36.5 C)  Recent Labs  Lab 07/10/23 2007  WBC 11.5*  CREATININE 0.92    CrCl cannot be calculated (Unknown ideal weight.).    Allergies  Allergen Reactions   Ciprofloxacin Other (See Comments)    Reaction=hypertension   Hydrocodone -Acetaminophen  Hives   Penicillins Hives and Nausea Only    Did it involve swelling of the face/tongue/throat, SOB, or low BP? N Did it involve sudden or severe rash/hives, skin peeling, or any reaction on the inside of your mouth or nose? N Did you need to seek medical attention at a hospital or doctor's office? N When did it last happen?      3 months ago If all above answers are "NO", may proceed with cephalosporin use.    Propoxyphene N-Acetaminophen  Hives   Tramadol Hcl Hives    Antimicrobials this admission: 4/24 Cefepime  >>  4/24 Vancomycin  >>   Dose adjustments this admission:  Microbiology results: 4/24 BCx:   Thank you for allowing pharmacy to be a part of this patient's care.  Arie Kurtz PharmD 07/10/2023 11:39 PM

## 2023-07-10 NOTE — Progress Notes (Signed)
 ED Pharmacy Antibiotic Sign Off An antibiotic consult was received from an ED provider for Cefepime  per pharmacy dosing for OM. A chart review was completed to assess appropriateness.   The following one time order(s) were placed:  Cefepime  2g IV x 1  Further antibiotic and/or antibiotic pharmacy consults should be ordered by the admitting provider if indicated.   Thank you for allowing pharmacy to be a part of this patient's care.    Kery Batzel S. Zannie Hey, PharmD, BCPS Clinical Staff Pharmacist [facility]  Clinical Pharmacist 07/10/23 7:17 PM

## 2023-07-10 NOTE — ED Provider Notes (Signed)
  EMERGENCY DEPARTMENT AT [facility] Provider Note   CSN: [id number] Arrival date & time: 07/10/23  1848     History  Chief Complaint  Patient presents with   Knee Pain    [name] is a 43 y.o. male here presenting with drainage from the left knee wound.  Patient has had chronic osteomyelitis of the left tibia.  Patient has been on 2 weeks of doxycycline .  Patient has been followed with Dr. Julio Ohm and was was scheduled to get 6 weeks of antibiotics as well as excisional debridement and placement of antibiotic tissue.  Patient states that he is supposed to get follow-up this week but has been calling the office unable to reach anyone or unable to get any appointments.  Patient states that he feels tired and weak and has subjective chills but no fever.  Patient noticed more drainage in the wound as well.  Patient came here for further evaluation.  The history is provided by the patient.       Home Medications Prior to Admission medications   Medication Sig Start Date End Date Taking? Authorizing Provider  amlodipine-benazepril (LOTREL) 2.5-10 MG capsule Take 1 capsule by mouth daily. 11/13/21   [provider]  doxycycline  (VIBRAMYCIN ) 100 MG capsule Take 1 capsule (100 mg total) by mouth 2 (two) times daily. 06/22/23   Henderly, Britni A, PA-C  lisinopril  (ZESTRIL ) 10 MG tablet Take 10 mg by mouth daily.     [provider]  ondansetron  (ZOFRAN -ODT) 4 MG disintegrating tablet Take 1 tablet (4 mg total) by mouth every 8 (eight) hours as needed for nausea or vomiting. 09/14/22   Thomes Flicker, PA  sulindac  (CLINORIL ) 150 MG tablet Take 1 tablet (150 mg total) by mouth 2 (two) times daily. 04/14/23   Darrin Emerald, MD  tadalafil (CIALIS) 20 MG tablet TAKE ONE TABLET BY MOUTH DAILY AS NEEDED FOR ERECTILE DYSFUNCTION 07/16/21   [provider]      Allergies    Ciprofloxacin, Hydrocodone -acetaminophen , Penicillins, Propoxyphene n-acetaminophen ,  and Tramadol hcl    Review of Systems   Review of Systems  Skin:  Positive for wound.  All other systems reviewed and are negative.   Physical Exam Updated Vital Signs BP (!) 137/103   Pulse 95   Temp (!) 97.5 F (36.4 C) (Oral)   Resp 15   SpO2 100%  Physical Exam Vitals and nursing note reviewed.  Constitutional:      Appearance: Normal appearance.  HENT:     Head: Normocephalic.     Nose: Nose normal.     Mouth/Throat:     Mouth: Mucous membranes are moist.  Eyes:     Extraocular Movements: Extraocular movements intact.     Pupils: Pupils are equal, round, and reactive to light.  Cardiovascular:     Rate and Rhythm: Normal rate and regular rhythm.     Pulses: Normal pulses.     Heart sounds: Normal heart sounds.  Pulmonary:     Effort: Pulmonary effort is normal.     Breath sounds: Normal breath sounds.  Abdominal:     General: Abdomen is flat.     Palpations: Abdomen is soft.  Musculoskeletal:     Cervical back: Normal range of motion and neck supple.     Comments: Patient has a wound below the left knee.  There is some purulent discharge.  No obvious bony tenderness.  Skin:    General: Skin is warm.  Capillary Refill: Capillary refill takes less than 2 seconds.  Neurological:     General: No focal deficit present.     Mental Status: He is alert and oriented to person, place, and time.  Psychiatric:        Mood and Affect: Mood normal.        Behavior: Behavior normal.     ED Results / Procedures / Treatments   Labs (all labs ordered are listed, but only abnormal results are displayed) Labs Reviewed  CBC WITH DIFFERENTIAL/PLATELET  COMPREHENSIVE METABOLIC PANEL WITH GFR    EKG None  Radiology No results found.  Procedures Procedures    Medications Ordered in ED Medications  sodium chloride  0.9 % bolus 1,000 mL (has no administration in time range)    ED Course/ Medical Decision Making/ A&P                                 Medical  Decision Making [name] is a 43 y.o. male who presented with left knee drainage.  Patient has a chronic osteomyelitis of the proximal left tibia.  Discussed with Dr. Hermina Loosen, orthopedic doctor who is covering Dr. Julio Ohm.  He states that since patient is scheduled for surgery, he recommends IV antibiotics and transfer to [facility].  Dr. Julio Ohm is in surgery tomorrow and should be able to perform his surgery tomorrow.  9:17 PM CBC showed white blood cell count at 11.5.  X-ray showed anterior tibial irregularity. Will admit for IV abx and transfer to [facility] and keep n.p.o. after midnight  Problems Addressed: Osteomyelitis of left tibia, unspecified type [facility]): chronic illness or injury with exacerbation, progression, or side effects of treatment  Amount and/or Complexity of Data Reviewed Labs: ordered. Decision-making details documented in ED Course. Radiology: ordered and independent interpretation performed. Decision-making details documented in ED Course.  Risk Prescription drug management.    Final Clinical Impression(s) / ED Diagnoses Final diagnoses:  None    Rx / DC Orders ED Discharge Orders     None         Dalene Duck, MD 07/10/23 2120

## 2023-07-10 NOTE — ED Triage Notes (Signed)
 BIB EMS from home, Dx L Knees infection 2 weeks ago taking doxycycline .  Reports has decrease appetite and increase in pain since yesterday.  EMS reports small amout ofwhite puss draining out of left knee  BP 150/90 HR110 O2 95% CBG 99 RR 20

## 2023-07-11 ENCOUNTER — Encounter (HOSPITAL_COMMUNITY): Payer: Self-pay | Admitting: Internal Medicine

## 2023-07-11 ENCOUNTER — Telehealth: Payer: MEDICAID | Admitting: Nurse Practitioner

## 2023-07-11 DIAGNOSIS — M86462 Chronic osteomyelitis with draining sinus, left tibia and fibula: Secondary | ICD-10-CM | POA: Diagnosis not present

## 2023-07-11 DIAGNOSIS — T8130XA Disruption of wound, unspecified, initial encounter: Secondary | ICD-10-CM

## 2023-07-11 LAB — BASIC METABOLIC PANEL WITH GFR
Anion gap: 7 (ref 5–15)
BUN: 14 mg/dL (ref 6–20)
CO2: 25 mmol/L (ref 22–32)
Calcium: 8.5 mg/dL — ABNORMAL LOW (ref 8.9–10.3)
Chloride: 104 mmol/L (ref 98–111)
Creatinine, Ser: 0.87 mg/dL (ref 0.61–1.24)
GFR, Estimated: >60 mL/min (ref >60)
Glucose, Bld: 101 mg/dL — ABNORMAL HIGH (ref 70–99)
Potassium: 4 mmol/L (ref 3.5–5.1)
Sodium: 136 mmol/L (ref 135–145)

## 2023-07-11 LAB — CBC
HCT: 39.2 % (ref 39.0–52.0)
Hemoglobin: 13.1 g/dL (ref 13.0–17.0)
MCH: 28.6 pg (ref 26.0–34.0)
MCHC: 33.4 g/dL (ref 30.0–36.0)
MCV: 85.6 fL (ref 80.0–100.0)
Platelets: 356 10*3/uL (ref 150–400)
RBC: 4.58 MIL/uL (ref 4.22–5.81)
RDW: 13 % (ref 11.5–15.5)
WBC: 8.5 10*3/uL (ref 4.0–10.5)
nRBC: 0 % (ref 0.0–0.2)

## 2023-07-11 LAB — HIV ANTIBODY (ROUTINE TESTING W REFLEX): HIV Screen 4th Generation wRfx: NONREACTIVE

## 2023-07-11 NOTE — Hospital Course (Signed)
 43 y.o. male with medical history significant of hypertension, meningitis/encephalitis, CVA, depression, marijuana and tobacco abuse, history of surgical debridement for osteomyelitis of left tibial tubercle with placement of antibiotic beads by Dr. Julio Ohm, p/w worsening pain and drainage of his left proximal tibia.  ED physician discussed the case with Dr. Hermina Loosen with orthopedics who recommended continuing IV antibiotics and transferring the patient to [facility] for medical admission.  Dr. Julio Ohm will perform surgery tomorrow.    Assessment and Plan   Chronic osteomyelitis of the left proximal tibia with draining sinus tract -Failed outpatient antibiotic therapy with doxycycline .  On presentation, WBC count 11.5.  Dr. Julio Ohm planning on excisional debridement of the tibia with placement of antibiotic tissue at [facility] some time today.  Keep n.p.o. with maintenance IV fluids.  Continue empiric vancomycin  and cefepime .  Continue pain management.   Hypertension -IV hydralazine  PRN SBP >160.   History of CVA Depression -can restart medications after procedure.

## 2023-07-11 NOTE — Plan of Care (Signed)
  Problem: Education: Goal: Knowledge of General Education information will improve Description: Including pain rating scale, medication(s)/side effects and non-pharmacologic comfort measures Outcome: Progressing   Problem: Activity: Goal: Risk for activity intolerance will decrease Outcome: Progressing   Problem: Nutrition: Goal: Adequate nutrition will be maintained Outcome: Progressing   Problem: Coping: Goal: Level of anxiety will decrease Outcome: Progressing   Problem: Elimination: Goal: Will not experience complications related to bowel motility Outcome: Progressing Goal: Will not experience complications related to urinary retention Outcome: Progressing   Problem: Pain Managment: Goal: General experience of comfort will improve and/or be controlled Outcome: Progressing   Problem: Safety: Goal: Ability to remain free from injury will improve Outcome: Progressing

## 2023-07-11 NOTE — Progress Notes (Signed)
  Progress Note   Patient: [name]:[id number] DOB: 31-Oct-1980 DOA: 07/10/2023     1 DOS: the patient was seen and examined on 07/11/2023   Brief hospital course:  43 y.o. male with medical history significant of hypertension, meningitis/encephalitis, CVA, depression, marijuana and tobacco abuse, history of surgical debridement for osteomyelitis of left tibial tubercle with placement of antibiotic beads by Dr. Julio Ohm, p/w worsening pain and drainage of his left proximal tibia.  ED physician discussed the case with Dr. Hermina Loosen with orthopedics who recommended continuing IV antibiotics and transferring the patient to [facility] for medical admission.  Dr. Julio Ohm will perform surgery tomorrow.   Assessment and Plan   Chronic osteomyelitis of the left proximal tibia with draining sinus tract -Failed outpatient antibiotic therapy with doxycycline .  On presentation, WBC count 11.5.  Dr. Julio Ohm planning on excisional debridement of the tibia with placement of antibiotic tissue at [facility] some time today.  Keep n.p.o. with maintenance IV fluids.  Continue empiric vancomycin  and cefepime .  Continue pain management.   Hypertension -IV hydralazine  PRN SBP >160.   History of CVA Depression -can restart medications after procedure.    Subjective: Patient resting comfortably in bedside chair.  Denies any fever, chills, chest pain, nausea, vomiting, abdominal pain.  He could be transferred over to [facility] for surgery with Dr. Julio Ohm.  Physical Exam: Vitals:   07/11/23 0520 07/11/23 0524 07/11/23 0853 07/11/23 1258  BP: 98/67  (!) 129/90 (!) 133/109  Pulse: 85  83 85  Resp: 20  16 17   Temp:  (!) 97.3 F (36.3 C) 97.7 F (36.5 C) (!) 97.4 F (36.3 C)  TempSrc:  Oral Oral Oral  SpO2: 100%  100% 100%    GENERAL:  Alert, pleasant, no acute distress  HEENT:  EOMI CARDIOVASCULAR:  RRR, no murmurs appreciated RESPIRATORY:  Clear to auscultation, no wheezing, rales, or  rhonchi GASTROINTESTINAL:  Soft, nontender, nondistended EXTREMITIES: LLE proximal tibia with anterior draining ulcer NEURO:  No new focal deficits appreciated SKIN:  No rashes noted PSYCH:  Appropriate mood and affect    Data Reviewed:  There are no new results to review at this time.  Labs: CBC: Recent Labs  Lab 07/10/23 2007 07/11/23 0525  WBC 11.5* 8.5  NEUTROABS 8.0*  --   HGB 16.2 13.1  HCT 47.9 39.2  MCV 85.7 85.6  PLT 420* 356   Basic Metabolic Panel: Recent Labs  Lab 07/10/23 2007 07/11/23 0525  NA 138 136  K 4.3 4.0  CL 100 104  CO2 29 25  GLUCOSE 97 101*  BUN 14 14  CREATININE 0.92 0.87  CALCIUM 9.7 8.5*   Liver Function Tests: Recent Labs  Lab 07/10/23 2007  AST 20  ALT 20  ALKPHOS 87  BILITOT 0.9  PROT 8.4*  ALBUMIN 4.5   CBG: No results for input(s): "GLUCAP" in the last 168 hours.  Scheduled Meds: Continuous Infusions:  ceFEPime  (MAXIPIME ) IV Stopped (07/11/23 0552)   vancomycin  Stopped (07/11/23 0504)   PRN Meds:.fentaNYL  (SUBLIMAZE ) injection, hydrALAZINE , ibuprofen , naLOXone  (NARCAN )  injection, oxyCODONE   Family Communication: None at bedside  Disposition: Status is: Inpatient Remains inpatient appropriate because: Osteomyelitis with sinus tract    Time spent: 36 minutes  Author: Jodeane Mulligan, DO 07/11/2023 1:24 PM  For on call review [email or web address].

## 2023-07-11 NOTE — Progress Notes (Signed)
 Patient transferred from [facility] long ED . Alert and oriented. On RA. Connected to Cardiac monitor box 10. Will continue to monitor

## 2023-07-12 ENCOUNTER — Other Ambulatory Visit (HOSPITAL_COMMUNITY): Payer: Self-pay

## 2023-07-12 DIAGNOSIS — I10 Essential (primary) hypertension: Secondary | ICD-10-CM

## 2023-07-12 DIAGNOSIS — Z8673 Personal history of transient ischemic attack (TIA), and cerebral infarction without residual deficits: Secondary | ICD-10-CM | POA: Diagnosis not present

## 2023-07-12 DIAGNOSIS — M86462 Chronic osteomyelitis with draining sinus, left tibia and fibula: Secondary | ICD-10-CM | POA: Diagnosis not present

## 2023-07-12 MED ORDER — DOXYCYCLINE HYCLATE 50 MG PO TABS
50.0000 mg | ORAL_TABLET | Freq: Two times a day (BID) | ORAL | 0 refills | Status: DC
  Filled 2023-07-12: qty 28, 14d supply, fill #0

## 2023-07-12 NOTE — Discharge Summary (Signed)
 Physician Discharge Summary  [name] ZOX:[id number] DOB: Feb 16, 1981 DOA: 07/10/2023  PCP: Jacqulyne Maxim, MD  Admit date: 07/10/2023 Discharge date: 07/12/2023  Admitted From: Home Disposition: Home  Recommendations for Outpatient Follow-up:  Follow up with Dr. Kurtis Philips office as soon as possible on Monday:  Home Health: None Equipment/Devices: None  Discharge Condition: Stable CODE STATUS: Full Diet recommendation: Low-salt low-fat diet as tolerated  Brief/Interim Summary: 43 y.o. male with medical history significant of hypertension, meningitis/encephalitis, CVA, depression, marijuana and tobacco abuse, history of surgical debridement for osteomyelitis of left tibial tubercle with placement of antibiotic beads by Dr. Julio Ohm, p/w worsening pain and drainage of his left proximal tibia.   Patient admitted as above with concern over worsening left tibial wound, further discussion with orthopedics indicates patient is stable for outpatient follow-up with Dr. Julio Ohm in his office next week.  Transition from IV antibiotics to p.o. doxycycline  for continued coverage.  No plans for inpatient therapy or surgery at this time.  No further indication for IV antibiotics per discussion with orthopedics.  Patient otherwise stable and agreeable for discharge home  Discharge Diagnoses:  Principal Problem:   Osteomyelitis (HCC) Active Problems:   Depression   Primary hypertension   History of stroke    Discharge Instructions  Discharge Instructions     Call MD for:  difficulty breathing, headache or visual disturbances   Complete by: As directed    Call MD for:  extreme fatigue   Complete by: As directed    Call MD for:  hives   Complete by: As directed    Call MD for:  persistant dizziness or light-headedness   Complete by: As directed    Call MD for:  persistant nausea and vomiting   Complete by: As directed    Call MD for:  redness, tenderness, or signs of infection  (pain, swelling, redness, odor or green/yellow discharge around incision site)   Complete by: As directed    Call MD for:  severe uncontrolled pain   Complete by: As directed    Call MD for:  temperature >100.4   Complete by: As directed    Diet - low sodium heart healthy   Complete by: As directed    Discharge wound care:   Complete by: As directed    Keep wound clean and dry - replace bandage if it gets soiled/wet/dirty   Increase activity slowly   Complete by: As directed       Allergies as of 07/12/2023       Reactions   Ciprofloxacin Other (See Comments)   Reaction=hypertension   Hydrocodone -acetaminophen  Hives   Penicillins Hives, Nausea Only   Did it involve swelling of the face/tongue/throat, SOB, or low BP? N Did it involve sudden or severe rash/hives, skin peeling, or any reaction on the inside of your mouth or nose? N Did you need to seek medical attention at a hospital or doctor's office? N When did it last happen?      3 months ago If all above answers are "NO", may proceed with cephalosporin use.   Propoxyphene N-acetaminophen  Hives   Tramadol Hcl Hives        Medication List     TAKE these medications    amlodipine-benazepril 2.5-10 MG capsule Commonly known as: LOTREL Take 1 capsule by mouth daily.   Doxycycline  Hyclate 50 MG Tabs Take 1 tablet by mouth 2 (two) times daily.   ibuprofen  200 MG tablet Commonly known as: ADVIL  Take 200-400 mg  by mouth every 6 (six) hours as needed for moderate pain (pain score 4-6).               Discharge Care Instructions  (From admission, onward)           Start     Ordered   07/12/23 0000  Discharge wound care:       Comments: Keep wound clean and dry - replace bandage if it gets soiled/wet/dirty   07/12/23 1122            Allergies  Allergen Reactions   Ciprofloxacin Other (See Comments)    Reaction=hypertension   Hydrocodone -Acetaminophen  Hives   Penicillins Hives and Nausea Only     Did it involve swelling of the face/tongue/throat, SOB, or low BP? N Did it involve sudden or severe rash/hives, skin peeling, or any reaction on the inside of your mouth or nose? N Did you need to seek medical attention at a hospital or doctor's office? N When did it last happen?      3 months ago If all above answers are "NO", may proceed with cephalosporin use.    Propoxyphene N-Acetaminophen  Hives   Tramadol Hcl Hives    Consultations: Orthopedic surgery  Procedures/Studies: DG Knee Complete 4 Views Left Result Date: 07/10/2023 CLINICAL DATA:  Knee pain, wound drainage. EXAM: LEFT KNEE - COMPLETE 4+ VIEW COMPARISON:  Left knee x-ray 06/22/2023. left knee x-ray 11/19/2021. FINDINGS: There is no acute fracture or dislocation. Joint spaces are maintained. There is soft tissue swelling and ulceration overlying the anterior tibial tuberosity. There is underlying cortical thickening and irregularity with some erosive change similar to the prior study. There is no significant joint effusion. IMPRESSION: Soft tissue swelling and ulceration overlying the anterior tibial tuberosity with underlying cortical thickening and irregularity with some erosive change similar to the prior study. Findings are concerning for osteomyelitis. Consider further evaluation with MRI to conserve firm. Electronically Signed   By: Tyron Gallon M.D.   On: 07/10/2023 20:29   MR TIBIA FIBULA LEFT W WO CONTRAST Result Date: 06/22/2023 CLINICAL DATA:  Open wound involving the anterior aspect of the proximal tibia. EXAM: MRI OF LOWER LEFT EXTREMITY WITHOUT AND WITH CONTRAST TECHNIQUE: Multiplanar, multisequence MR imaging of the left lower extremity was performed both before and after administration of intravenous contrast. CONTRAST:  7mL GADAVIST  GADOBUTROL  1 MMOL/ML IV SOLN COMPARISON:  Radiographs 06/22/2023.  Prior MRI from 2020 FINDINGS: Chronic open wound involving the anterior aspect of the upper tibial region just below the  knee joint. This involves the distal aspect of the patellar tendon which demonstrates significant septic tendinopathy and longitudinal split type tear/central defect. Abnormal T1 and T2 signal intensity in the proximal tibia along the tibial tubercle with subsequent contrast enhancement consistent with osteomyelitis. There is also extensive enhancing granulation tissue around the wound without discrete drainable soft tissue abscess. Chronic changes involving the patella possibly related to prior large osteochondral lesion, bone infarct or infection. No findings suspicious for septic arthritis involving the knee joint. No findings for internal derangement. IMPRESSION: 1. Chronic open wound involving the anterior aspect of the upper tibial region just below the knee joint. This involves the distal aspect of the patellar tendon which demonstrates significant septic tendinopathy and longitudinal split type tear/central defect. 2. Osteomyelitis involving the proximal tibia along the tibial tubercle. 3. Extensive enhancing granulation tissue around without discrete drainable soft tissue abscess. 4. Chronic changes involving the patella possibly related to prior large osteochondral lesion, bone infarct  or infection. 5. No findings suspicious for septic arthritis involving the knee joint. Electronically Signed   By: Marrian Siva M.D.   On: 06/22/2023 19:12   DG Knee Complete 4 Views Left Result Date: 06/22/2023 CLINICAL DATA:  Chronic wound. Fall a few weeks ago with infected wound to left knee. EXAM: LEFT KNEE - COMPLETE 4+ VIEW COMPARISON:  11/19/2021. FINDINGS: There is no evidence of acute fracture or dislocation. No joint effusion is seen. 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 A soft tissue defect with air is noted in the anterior aspect of the tibial tuberosity. No radiographic evidence of osteomyelitis is seen. IMPRESSION: 1. No radiographic evidence of  osteomyelitis. 2. Mild-to-moderate tricompartmental degenerative changes. Electronically Signed   By: Wyvonnia Heimlich M.D.   On: 06/22/2023 12:54     Subjective: No acute issues or events overnight denies nausea vomiting diarrhea constipation headache fevers chills or chest pain   Discharge Exam: Vitals:   07/11/23 1956 07/12/23 0736  BP: (!) 135/108 124/89  Pulse: 86 67  Resp: 18 18  Temp: 98 F (36.7 C) 97.7 F (36.5 C)  SpO2: 100% 100%   Vitals:   07/11/23 1422 07/11/23 1529 07/11/23 1956 07/12/23 0736  BP:  134/78 (!) 135/108 124/89  Pulse:  73 86 67  Resp:  19 18 18   Temp:  98.8 F (37.1 C) 98 F (36.7 C) 97.7 F (36.5 C)  TempSrc:   Oral Oral  SpO2:  100% 100% 100%  Weight: 84.2 kg       General: Pt is alert, awake, not in acute distress Cardiovascular: RRR, S1/S2 +, no rubs, no gallops Respiratory: CTA bilaterally, no wheezing, no rhonchi Abdominal: Soft, NT, ND, bowel sounds + Extremities: no edema, no cyanosis left foot bandage clean dry intact    The results of significant diagnostics from this hospitalization (including imaging, microbiology, ancillary and laboratory) are listed below for reference.     Microbiology: Recent Results (from the past 240 hours)  Blood culture (routine x 2)     Status: None (Preliminary result)   Collection Time: 07/10/23  8:07 PM   Specimen: BLOOD RIGHT FOREARM  Result Value Ref Range Status   Specimen Description   Final    BLOOD RIGHT FOREARM Performed at [facility], [address], [place], [place] [address]    Special Requests   Final    BOTTLES DRAWN AEROBIC AND ANAEROBIC Blood Culture adequate volume Performed at [facility], [address], [place], [place] [address]    Culture   Final    NO GROWTH 2 DAYS Performed at [facility], [address] [place], [place] [address]    Report Status PENDING  Incomplete  Blood culture (routine x 2)     Status: None (Preliminary result)    Collection Time: 07/10/23  8:27 PM   Specimen: BLOOD LEFT FOREARM  Result Value Ref Range Status   Specimen Description   Final    BLOOD LEFT FOREARM Performed at [facility], [address] [place], [place] [address]    Special Requests   Final    BOTTLES DRAWN AEROBIC AND ANAEROBIC Blood Culture adequate volume Performed at [facility], [address], [place], [place] [address]    Culture   Final    NO GROWTH 2 DAYS Performed at [facility], [address], [place], [place] [address]    Report Status  PENDING  Incomplete     Labs: BNP (last 3 results) No results for input(s): "BNP" in the last 8760 hours. Basic Metabolic Panel: Recent Labs  Lab 07/10/23 2007 07/11/23 0525  NA 138 136  K 4.3 4.0  CL 100 104  CO2 29 25  GLUCOSE 97 101*  BUN 14 14  CREATININE 0.92 0.87  CALCIUM 9.7 8.5*   Liver Function Tests: Recent Labs  Lab 07/10/23 2007  AST 20  ALT 20  ALKPHOS 87  BILITOT 0.9  PROT 8.4*  ALBUMIN 4.5   No results for input(s): "LIPASE", "AMYLASE" in the last 168 hours. No results for input(s): "AMMONIA" in the last 168 hours. CBC: Recent Labs  Lab 07/10/23 2007 07/11/23 0525  WBC 11.5* 8.5  NEUTROABS 8.0*  --   HGB 16.2 13.1  HCT 47.9 39.2  MCV 85.7 85.6  PLT 420* 356   Cardiac Enzymes: No results for input(s): "CKTOTAL", "CKMB", "CKMBINDEX", "TROPONINI" in the last 168 hours. BNP: Invalid input(s): "POCBNP" CBG: No results for input(s): "GLUCAP" in the last 168 hours. D-Dimer No results for input(s): "DDIMER" in the last 72 hours. Hgb A1c No results for input(s): "HGBA1C" in the last 72 hours. Lipid Profile No results for input(s): "CHOL", "HDL", "LDLCALC", "TRIG", "CHOLHDL", "LDLDIRECT" in the last 72 hours. Thyroid function studies No results for input(s): "TSH", "T4TOTAL", "T3FREE", "THYROIDAB" in the last 72 hours.  Invalid input(s): "FREET3" Anemia work up No results for input(s): "VITAMINB12",  "FOLATE", "FERRITIN", "TIBC", "IRON", "RETICCTPCT" in the last 72 hours. Urinalysis    Component Value Date/Time   COLORURINE YELLOW 09/14/2022 1015   APPEARANCEUR CLEAR 09/14/2022 1015   LABSPEC 1.021 09/14/2022 1015   PHURINE 6.0 09/14/2022 1015   GLUCOSEU NEGATIVE 09/14/2022 1015   HGBUR SMALL (A) 09/14/2022 1015   HGBUR negative 07/28/2007 1039   BILIRUBINUR NEGATIVE 09/14/2022 1015   KETONESUR 5 (A) 09/14/2022 1015   PROTEINUR NEGATIVE 09/14/2022 1015   UROBILINOGEN 1.0 06/28/2013 1910   NITRITE NEGATIVE 09/14/2022 1015   LEUKOCYTESUR NEGATIVE 09/14/2022 1015   Sepsis Labs Recent Labs  Lab 07/10/23 2007 07/11/23 0525  WBC 11.5* 8.5   Microbiology Recent Results (from the past 240 hours)  Blood culture (routine x 2)     Status: None (Preliminary result)   Collection Time: 07/10/23  8:07 PM   Specimen: BLOOD RIGHT FOREARM  Result Value Ref Range Status   Specimen Description   Final    BLOOD RIGHT FOREARM Performed at [facility], [address], [place], [place] [address]    Special Requests   Final    BOTTLES DRAWN AEROBIC AND ANAEROBIC Blood Culture adequate volume Performed at [facility], [address], [place], [place] [address]    Culture   Final    NO GROWTH 2 DAYS Performed at [facility], [address] [place], [place] [address]    Report Status PENDING  Incomplete  Blood culture (routine x 2)     Status: None (Preliminary result)   Collection Time: 07/10/23  8:27 PM   Specimen: BLOOD LEFT FOREARM  Result Value Ref Range Status   Specimen Description   Final    BLOOD LEFT FOREARM Performed at [facility], [address], [place], [place] [address]    Special Requests   Final    BOTTLES DRAWN AEROBIC AND ANAEROBIC Blood Culture adequate volume Performed at [facility], [address], [place], [place] [address]  Culture   Final    NO GROWTH 2 DAYS Performed at [facility], [address] [place], [place] [address]    Report Status PENDING  Incomplete     Time coordinating discharge: Over 30 minutes  SIGNED:   Haydee Lipa, DO [facility] 07/12/2023, 1:30 PM Pager   If 7PM-7AM, please contact night-coverage [email or web address]

## 2023-07-14 ENCOUNTER — Ambulatory Visit (INDEPENDENT_AMBULATORY_CARE_PROVIDER_SITE_OTHER): Payer: MEDICAID | Admitting: Orthopedic Surgery

## 2023-07-14 ENCOUNTER — Encounter: Payer: Self-pay | Admitting: Orthopedic Surgery

## 2023-07-14 DIAGNOSIS — M86462 Chronic osteomyelitis with draining sinus, left tibia and fibula: Secondary | ICD-10-CM | POA: Diagnosis not present

## 2023-07-14 NOTE — Progress Notes (Signed)
 Office Visit Note   Patient: [name]           Date of Birth: 1980-05-09           MRN: [id number] Visit Date: 07/14/2023              Requested by: Jacqulyne Maxim, MD [address] [place],  [place] [address] PCP: Jacqulyne Maxim, MD  Chief Complaint  Patient presents with   Left Knee - Follow-up      HPI: Patient is a 43 year old gentleman who is seen for evaluation for left proximal tibia with history of chronic osteomyelitis.  Patient did present to the emergency room was admitted to [facility] and then discharged to home.  Patient received IV antibiotics while hospitalized.  Assessment & Plan: Visit Diagnoses:  1. 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  Plan for initial debridement on Wednesday with obtaining deep tissue cultures and return to the operating room on Friday for repeat debridement.  Anticipate discharge to home with a PICC line.  Follow-Up Instructions: Return in about 4 weeks (around 08/11/2023).   Ortho Exam  Patient is alert, oriented, no adenopathy, well-dressed, normal affect, normal respiratory effort. Examination patient has a chronic ulcer over the proximal tibial tubercle.  There is hypergranulation tissue as well as callus around the wound edges that is 3 cm in diameter.  There is no ascending cellulitis.  No purulent drainage.  Imaging: No results found. No images are attached to the encounter.  Labs: Lab Results  Component Value Date   ESRSEDRATE 17 (H) 01/08/2020   ESRSEDRATE 47 (H) 11/16/2018   ESRSEDRATE 10 05/05/2008   CRP 6.7 (H) 01/08/2020   CRP 13.9 (H) 11/16/2018   REPTSTATUS PENDING 07/10/2023   GRAMSTAIN  01/12/2020    FEW WBC PRESENT, PREDOMINANTLY PMN NO ORGANISMS SEEN    CULT  07/10/2023    NO GROWTH 4 DAYS Performed at [facility], [address], [place], [place]  [address]    LABORGA STAPHYLOCOCCUS HAEMOLYTICUS (A) 01/06/2020     Lab Results  Component Value Date   ALBUMIN 4.5 07/10/2023   ALBUMIN 3.5 09/14/2022   ALBUMIN 3.1 (L) 01/08/2020    No results found for: "MG" No results found for: "VD25OH"  No results found for: "PREALBUMIN"    Latest Ref Rng & Units 07/11/2023    5:25 AM 07/10/2023    8:07 PM 06/22/2023   11:47 AM  CBC EXTENDED  WBC 4.0 - 10.5 K/uL 8.5  11.5  8.1   RBC 4.22 - 5.81 MIL/uL 4.58  5.59  5.53   Hemoglobin 13.0 - 17.0 g/dL 30.8  65.7  84.6   HCT 39.0 - 52.0 % 39.2  47.9  47.4   Platelets 150 - 400 K/uL 356  420  434   NEUT# 1.7 - 7.7 K/uL  8.0    Lymph# 0.7 - 4.0 K/uL  2.3       There is no height or weight on file to calculate BMI.  Orders:  No orders of the defined types were placed in this encounter.  No orders of the defined types were placed in this encounter.    Procedures: No procedures performed  Clinical Data: No additional findings.  ROS:  All other systems negative, except as noted in the HPI. Review of Systems  Objective:  Vital Signs: There were no vitals taken for this visit.  Specialty Comments:  No specialty comments available.  PMFS History: Patient Active Problem List   Diagnosis Date Noted   History of stroke 07/10/2023   Ulcer of left lower leg, with necrosis of bone (HCC)    Sepsis (HCC)    Primary hypertension    History of encephalitis 06/23/2019   Leukocytosis 11/17/2018   Osteomyelitis (HCC) 11/16/2018   HCAP (healthcare-associated pneumonia) 06/28/2013   Chest pain, atypical 06/28/2013   SIRS (systemic inflammatory response syndrome) (HCC) 06/28/2013   Hyponatremia 06/28/2013   Headache 01/05/2013   Leg pain 01/05/2013   Dizziness 01/05/2013   ALCOHOL ABUSE, IN REMISSION 06/10/2007   CIGARETTE SMOKER 06/10/2007   Depression 06/10/2007   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   HEMIPLEGIA, SPASTIC, NONDOMINANT SIDE 06/10/2007   POSTTRAUMATIC WOUND INFECTION NEC  06/10/2007   Tobacco dependence syndrome 06/10/2007   Past Medical History:  Diagnosis Date   Depression    Encephalitis    Hypertension    Meningitis    Paralysis, 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   Stroke (HCC)     Family History  Problem Relation Age of Onset   Hypertension Mother    Hypertension Father     Past Surgical History:  Procedure Laterality Date   I & D EXTREMITY Left 01/12/2020   Procedure: EXCISION DEBRIDEMENT LEFT TIBIAL TUBERCLE, PLACE ANTIBIOTIC BEADS;  Surgeon: Timothy Ford, MD;  Location: [facility] OR;  Service: Orthopedics;  Laterality: Left;   JOINT REPLACEMENT     KNEE SURGERY     Social History   Occupational History   Not on file  Tobacco Use   Smoking status: Every Day    Current packs/day: 0.25    Types: Cigarettes   Smokeless tobacco: Never  Vaping Use   Vaping status: Never Used  Substance and Sexual Activity   Alcohol use: No   Drug use: Yes    Types: Marijuana    Comment: not used in 3 weeks   Sexual activity: Not on file

## 2023-07-14 NOTE — Progress Notes (Signed)
 [name],  We would like you to follow back up with your surgical team tomorrow. If you are having any acute changes overnight or worsening symptoms you may need to return to the Emergency Room.   Since your condition is currently under the care and plan of another team we would prefer they follow your care and plan for surgery. Please give their office a call in the morning.   If you need emergency help prior to that:  Based on what you shared with me, I feel your condition warrants further evaluation as soon as possible at an Emergency department.    NOTE: There will be NO CHARGE for this eVisit   If you are having a true medical emergency please call 911.      Emergency Department-[facility]  Get Driving Directions  [phone]  [address]  [place], [place] [address]  Open 24/7/365      [facility] Emergency Department at [facility]  Get Driving Directions  [address]  [place], [place] [address]  Open 24/7/365    Emergency Department- [facility]  Get Driving Directions  [phone]  [address]  [place], [place] [address]  Open 24/7/365      [facility]  Get Driving Directions  [phone]  [address]  [place], [place] [address]  Open 24/7/365    [facility]  Emergency Department- [facility]  Get Driving Directions  [phone]  [address]  [place], [place] [address]  Open 24/7/365    [place]  Emergency Department- [facility]  Get Driving Directions  [address]  [place], [place] [address]  Open 24/7/365    [facility]  Emergency Department- [facility]  Get Driving Directions  [phone]  [address]  [place], [place] [address]  Open 24/7/365

## 2023-07-15 ENCOUNTER — Encounter (HOSPITAL_COMMUNITY): Payer: Self-pay | Admitting: Orthopedic Surgery

## 2023-07-15 ENCOUNTER — Other Ambulatory Visit: Payer: Self-pay

## 2023-07-15 LAB — CULTURE, BLOOD (ROUTINE X 2)
Culture: NO GROWTH
Culture: NO GROWTH
Special Requests: ADEQUATE
Special Requests: ADEQUATE

## 2023-07-15 NOTE — Progress Notes (Signed)
 SDW call  Patient was given pre-op instructions over the phone. Patient verbalized understanding of instructions provided.     PCP - Dr. Dorthula Gavel Cardiologist -  Pulmonary:    PPM/ICD - denies Device Orders - na Rep Notified - na   Chest x-ray - na EKG -  06/23/2023 Stress Test - ECHO -  Cardiac Cath -   Sleep Study/sleep apnea/CPAP: denies  Non-diabetic  Blood Thinner Instructions: denies Aspirin Instructions:denies   ERAS Protcol - Clears until 0920  Anesthesia review: Yes.  HTN, stroke, paralysis, recent admit 4/24-4/26   Patient denies shortness of breath, fever, cough and chest pain over the phone call  Your procedure is scheduled on Wednesday July 16, 2023  Report to [facility] Main Entrance "A" at  0950  A.M., then check in with the Admitting office.  Call this number if you have problems the morning of surgery:  [phone]   If you have any questions prior to your surgery date call [phone]: Open Monday-Friday 8am-4pm If you experience any cold or flu symptoms such as cough, fever, chills, shortness of breath, etc. between now and your scheduled surgery, please notify us  at the above number    Remember:  Do not eat after midnight the night before your surgery  You may drink clear liquids until  0920   the morning of your surgery.   Clear liquids allowed are: Water, Non-Citrus Juices (without pulp), Carbonated Beverages, Clear Tea, Black Coffee ONLY (NO MILK, CREAM OR POWDERED CREAMER of any kind), and Gatorade   Take these medicines the morning of surgery with A SIP OF WATER:  Doxycycline   As of today, STOP taking any Aspirin (unless otherwise instructed by your surgeon) Aleve , Naproxen , Ibuprofen , Motrin , Advil , Goody's, BC's, all herbal medications, fish oil, and all vitamins.

## 2023-07-16 ENCOUNTER — Encounter (HOSPITAL_COMMUNITY): Payer: Self-pay | Admitting: Orthopedic Surgery

## 2023-07-16 ENCOUNTER — Ambulatory Visit (HOSPITAL_COMMUNITY): Payer: MEDICAID | Admitting: Physician Assistant

## 2023-07-16 ENCOUNTER — Inpatient Hospital Stay (HOSPITAL_COMMUNITY)
Admission: AD | Admit: 2023-07-16 | Discharge: 2023-07-19 | DRG: 465 | Disposition: A | Discharge status: Home / Self Care | Payer: MEDICAID | Attending: Orthopedic Surgery | Admitting: Orthopedic Surgery

## 2023-07-16 ENCOUNTER — Other Ambulatory Visit: Payer: Self-pay

## 2023-07-16 ENCOUNTER — Encounter (HOSPITAL_COMMUNITY)
Admission: AD | Disposition: A | Discharge status: Home / Self Care | Payer: Self-pay | Source: Home / Self Care | Attending: Orthopedic Surgery

## 2023-07-16 DIAGNOSIS — I639 Cerebral infarction, unspecified: Secondary | ICD-10-CM

## 2023-07-16 DIAGNOSIS — Z8661 Personal history of infections of the central nervous system: Secondary | ICD-10-CM

## 2023-07-16 DIAGNOSIS — F17210 Nicotine dependence, cigarettes, uncomplicated: Secondary | ICD-10-CM | POA: Diagnosis present

## 2023-07-16 DIAGNOSIS — Z8249 Family history of ischemic heart disease and other diseases of the circulatory system: Secondary | ICD-10-CM

## 2023-07-16 DIAGNOSIS — Z881 Allergy status to other antibiotic agents status: Secondary | ICD-10-CM | POA: Diagnosis not present

## 2023-07-16 DIAGNOSIS — G839 Paralytic syndrome, unspecified: Secondary | ICD-10-CM | POA: Diagnosis present

## 2023-07-16 DIAGNOSIS — Z880 Allergy status to penicillin: Secondary | ICD-10-CM

## 2023-07-16 DIAGNOSIS — I10 Essential (primary) hypertension: Secondary | ICD-10-CM | POA: Diagnosis present

## 2023-07-16 DIAGNOSIS — M86462 Chronic osteomyelitis with draining sinus, left tibia and fibula: Secondary | ICD-10-CM | POA: Diagnosis present

## 2023-07-16 DIAGNOSIS — Z888 Allergy status to other drugs, medicaments and biological substances status: Secondary | ICD-10-CM | POA: Diagnosis not present

## 2023-07-16 DIAGNOSIS — Z8673 Personal history of transient ischemic attack (TIA), and cerebral infarction without residual deficits: Secondary | ICD-10-CM

## 2023-07-16 DIAGNOSIS — M869 Osteomyelitis, unspecified: Secondary | ICD-10-CM | POA: Diagnosis present

## 2023-07-16 HISTORY — PX: TIBIA OSTEOTOMY: SHX1065

## 2023-07-16 SURGERY — OSTEOTOMY, TIBIA
Anesthesia: General | Laterality: Left

## 2023-07-16 MED ORDER — FENTANYL CITRATE (PF) 250 MCG/5ML IJ SOLN
INTRAMUSCULAR | Status: DC | PRN
  Administered 2023-07-16: 50 ug via INTRAVENOUS

## 2023-07-16 MED ORDER — DROPERIDOL 2.5 MG/ML IJ SOLN: 0.6250 mg | Freq: Once | INTRAMUSCULAR | Status: DC | PRN

## 2023-07-16 MED ORDER — VANCOMYCIN HCL 1000 MG IV SOLR
INTRAVENOUS | Status: AC
Start: 2023-07-16 — End: ?
  Filled 2023-07-16: qty 20

## 2023-07-16 MED ORDER — ACETAMINOPHEN 500 MG PO TABS
1000.0000 mg | ORAL_TABLET | Freq: Once | ORAL | Status: AC
  Administered 2023-07-16: 1000 mg via ORAL
  Filled 2023-07-16: qty 2

## 2023-07-16 MED ORDER — HYDROMORPHONE HCL 1 MG/ML IJ SOLN: 0.5000 mg | INTRAMUSCULAR | Status: DC | PRN

## 2023-07-16 MED ORDER — OXYCODONE HCL 5 MG PO TABS
10.0000 mg | ORAL_TABLET | ORAL | Status: DC | PRN
Start: 2023-07-16 — End: 2023-07-19
  Administered 2023-07-16 – 2023-07-19 (×3): 15 mg via ORAL
  Filled 2023-07-16 (×3): qty 3

## 2023-07-16 MED ORDER — BISACODYL 10 MG RE SUPP: 10.0000 mg | Freq: Every day | RECTAL | Status: DC | PRN

## 2023-07-16 MED ORDER — VASHE WOUND IRRIGATION OPTIME
TOPICAL | Status: DC | PRN
  Administered 2023-07-16: 34 [oz_av]

## 2023-07-16 MED ORDER — ROPIVACAINE HCL 5 MG/ML IJ SOLN
INTRAMUSCULAR | Status: DC | PRN
  Administered 2023-07-16: 20 mL via PERINEURAL

## 2023-07-16 MED ORDER — ONDANSETRON HCL 4 MG/2ML IJ SOLN: 4.0000 mg | Freq: Four times a day (QID) | INTRAMUSCULAR | Status: DC | PRN

## 2023-07-16 MED ORDER — ORAL CARE MOUTH RINSE: 15.0000 mL | Freq: Once | OROMUCOSAL | Status: AC

## 2023-07-16 MED ORDER — AMLODIPINE BESYLATE 2.5 MG PO TABS
2.5000 mg | ORAL_TABLET | Freq: Every day | ORAL | Status: DC
  Administered 2023-07-17 – 2023-07-19 (×3): 2.5 mg via ORAL
  Filled 2023-07-16 (×3): qty 1

## 2023-07-16 MED ORDER — FENTANYL CITRATE (PF) 250 MCG/5ML IJ SOLN
INTRAMUSCULAR | Status: AC
  Filled 2023-07-16: qty 5

## 2023-07-16 MED ORDER — CEFAZOLIN SODIUM-DEXTROSE 2-4 GM/100ML-% IV SOLN
2.0000 g | INTRAVENOUS | Status: AC
  Administered 2023-07-16: 2 g via INTRAVENOUS
  Filled 2023-07-16: qty 100

## 2023-07-16 MED ORDER — FENTANYL CITRATE PF 50 MCG/ML IJ SOSY
50.0000 ug | PREFILLED_SYRINGE | Freq: Once | INTRAMUSCULAR | Status: AC
  Administered 2023-07-16: 50 ug via INTRAVENOUS

## 2023-07-16 MED ORDER — METHOCARBAMOL 1000 MG/10ML IJ SOLN: 500.0000 mg | Freq: Four times a day (QID) | INTRAMUSCULAR | Status: DC | PRN

## 2023-07-16 MED ORDER — PROPOFOL 500 MG/50ML IV EMUL
INTRAVENOUS | Status: DC | PRN
  Administered 2023-07-16: 75 ug/kg/min via INTRAVENOUS

## 2023-07-16 MED ORDER — OXYCODONE HCL 5 MG PO TABS
ORAL_TABLET | ORAL | Status: AC
  Filled 2023-07-16: qty 1

## 2023-07-16 MED ORDER — FENTANYL CITRATE (PF) 100 MCG/2ML IJ SOLN
INTRAMUSCULAR | Status: AC
  Filled 2023-07-16: qty 2

## 2023-07-16 MED ORDER — MIDAZOLAM HCL 2 MG/2ML IJ SOLN
INTRAMUSCULAR | Status: DC
Start: 2023-07-16 — End: 2023-07-16
  Filled 2023-07-16: qty 2

## 2023-07-16 MED ORDER — SODIUM CHLORIDE 0.9% FLUSH
3.0000 mL | Freq: Two times a day (BID) | INTRAVENOUS | Status: DC
  Administered 2023-07-16 – 2023-07-17 (×2): 3 mL via INTRAVENOUS
  Administered 2023-07-17: 10 mL via INTRAVENOUS

## 2023-07-16 MED ORDER — LACTATED RINGERS IV SOLN: INTRAVENOUS | Status: DC

## 2023-07-16 MED ORDER — CHLORHEXIDINE GLUCONATE 0.12 % MT SOLN
15.0000 mL | Freq: Once | OROMUCOSAL | Status: AC
  Administered 2023-07-16: 15 mL via OROMUCOSAL
  Filled 2023-07-16: qty 15

## 2023-07-16 MED ORDER — VANCOMYCIN HCL 1000 MG IV SOLR
INTRAVENOUS | Status: DC | PRN
  Administered 2023-07-16: 1000 mg

## 2023-07-16 MED ORDER — 0.9 % SODIUM CHLORIDE (POUR BTL) OPTIME
TOPICAL | Status: DC | PRN
  Administered 2023-07-16: 1000 mL

## 2023-07-16 MED ORDER — OXYCODONE HCL 5 MG/5ML PO SOLN: 5.0000 mg | Freq: Once | ORAL | Status: AC | PRN

## 2023-07-16 MED ORDER — OXYCODONE HCL 5 MG PO TABS: 5.0000 mg | ORAL_TABLET | ORAL | Status: DC | PRN

## 2023-07-16 MED ORDER — METOCLOPRAMIDE HCL 5 MG/ML IJ SOLN: 5.0000 mg | Freq: Three times a day (TID) | INTRAMUSCULAR | Status: DC | PRN

## 2023-07-16 MED ORDER — METHOCARBAMOL 500 MG PO TABS: 500.0000 mg | ORAL_TABLET | Freq: Four times a day (QID) | ORAL | Status: DC | PRN

## 2023-07-16 MED ORDER — METOCLOPRAMIDE HCL 5 MG PO TABS: 5.0000 mg | ORAL_TABLET | Freq: Three times a day (TID) | ORAL | Status: DC | PRN

## 2023-07-16 MED ORDER — DOCUSATE SODIUM 100 MG PO CAPS
100.0000 mg | ORAL_CAPSULE | Freq: Two times a day (BID) | ORAL | Status: DC
  Filled 2023-07-16 (×6): qty 1

## 2023-07-16 MED ORDER — POLYETHYLENE GLYCOL 3350 17 G PO PACK: 17.0000 g | PACK | Freq: Every day | ORAL | Status: DC | PRN

## 2023-07-16 MED ORDER — ONDANSETRON HCL 4 MG PO TABS: 4.0000 mg | ORAL_TABLET | Freq: Four times a day (QID) | ORAL | Status: DC | PRN

## 2023-07-16 MED ORDER — ORAL CARE MOUTH RINSE: 15.0000 mL | OROMUCOSAL | Status: DC | PRN

## 2023-07-16 MED ORDER — OXYCODONE HCL 5 MG PO TABS
5.0000 mg | ORAL_TABLET | Freq: Once | ORAL | Status: AC | PRN
  Administered 2023-07-16: 5 mg via ORAL

## 2023-07-16 MED ORDER — SODIUM CHLORIDE 0.9% FLUSH: 3.0000 mL | INTRAVENOUS | Status: DC | PRN

## 2023-07-16 MED ORDER — BENAZEPRIL HCL 5 MG PO TABS
10.0000 mg | ORAL_TABLET | Freq: Every day | ORAL | Status: DC
  Administered 2023-07-17 – 2023-07-19 (×3): 10 mg via ORAL
  Filled 2023-07-16 (×3): qty 2

## 2023-07-16 MED ORDER — ACETAMINOPHEN 325 MG PO TABS: 325.0000 mg | ORAL_TABLET | Freq: Four times a day (QID) | ORAL | Status: DC | PRN

## 2023-07-16 MED ORDER — MAGNESIUM CITRATE PO SOLN: 1.0000 | Freq: Once | ORAL | Status: DC | PRN

## 2023-07-16 MED ORDER — PROPOFOL 10 MG/ML IV BOLUS
INTRAVENOUS | Status: DC | PRN
Start: 2023-07-16 — End: 2023-07-16
  Administered 2023-07-16: 30 mg via INTRAVENOUS
  Administered 2023-07-16: 150 mg via INTRAVENOUS

## 2023-07-16 MED ORDER — ONDANSETRON HCL 4 MG/2ML IJ SOLN
INTRAMUSCULAR | Status: DC | PRN
  Administered 2023-07-16: 4 mg via INTRAVENOUS

## 2023-07-16 MED ORDER — FENTANYL CITRATE (PF) 100 MCG/2ML IJ SOLN
25.0000 ug | INTRAMUSCULAR | Status: DC | PRN
Start: 2023-07-16 — End: 2023-07-16
  Administered 2023-07-16: 50 ug via INTRAVENOUS

## 2023-07-16 SURGICAL SUPPLY — 44 items
BAG COUNTER SPONGE SURGICOUNT (BAG) ×1 IMPLANT
BLADE AVERAGE 25X9 (BLADE) IMPLANT
BLADE MINI RND TIP GREEN BEAV (BLADE) IMPLANT
BNDG COHESIVE 1X5 TAN STRL LF (GAUZE/BANDAGES/DRESSINGS) IMPLANT
BNDG COHESIVE 6X5 TAN NS LF (GAUZE/BANDAGES/DRESSINGS) IMPLANT
BNDG COHESIVE 6X5 TAN ST LF (GAUZE/BANDAGES/DRESSINGS) IMPLANT
BNDG ESMARK 4X9 LF (GAUZE/BANDAGES/DRESSINGS) ×1 IMPLANT
BNDG GAUZE DERMACEA FLUFF 4 (GAUZE/BANDAGES/DRESSINGS) IMPLANT
CORD BIPOLAR FORCEPS 12FT (ELECTRODE) ×1 IMPLANT
COTTON STERILE ROLL (GAUZE/BANDAGES/DRESSINGS) IMPLANT
COVER SURGICAL LIGHT HANDLE (MISCELLANEOUS) ×2 IMPLANT
CUFF TOURN SGL QUICK 18X4 (TOURNIQUET CUFF) IMPLANT
CUFF TRNQT CYL 24X4X16.5-23 (TOURNIQUET CUFF) IMPLANT
DRAPE DERMATAC (DRAPES) IMPLANT
DRAPE OEC MINIVIEW 54X84 (DRAPES) IMPLANT
DRAPE U-SHAPE 47X51 STRL (DRAPES) ×1 IMPLANT
DRESSING PEEL AND PLC PRVNA 13 (GAUZE/BANDAGES/DRESSINGS) IMPLANT
DRSG ADAPTIC 3X8 NADH LF (GAUZE/BANDAGES/DRESSINGS) IMPLANT
DURAPREP 26ML APPLICATOR (WOUND CARE) ×1 IMPLANT
ELECTRODE REM PT RTRN 9FT ADLT (ELECTROSURGICAL) ×1 IMPLANT
GAUZE SPONGE 4X4 12PLY STRL (GAUZE/BANDAGES/DRESSINGS) IMPLANT
GLOVE BIOGEL PI IND STRL 9 (GLOVE) ×1 IMPLANT
GLOVE SURG ORTHO 9.0 STRL STRW (GLOVE) ×1 IMPLANT
GOWN STRL REUS W/ TWL XL LVL3 (GOWN DISPOSABLE) ×2 IMPLANT
GRAFT SKIN WND MICRO 38 (Tissue) IMPLANT
KIT BASIN OR (CUSTOM PROCEDURE TRAY) ×1 IMPLANT
KIT TURNOVER KIT B (KITS) ×1 IMPLANT
MANIFOLD NEPTUNE II (INSTRUMENTS) ×1 IMPLANT
NDL HYPO 25GX1X1/2 BEV (NEEDLE) IMPLANT
NEEDLE HYPO 25GX1X1/2 BEV (NEEDLE) IMPLANT
NS IRRIG 1000ML POUR BTL (IV SOLUTION) ×1 IMPLANT
PACK ORTHO EXTREMITY (CUSTOM PROCEDURE TRAY) ×1 IMPLANT
PAD ARMBOARD POSITIONER FOAM (MISCELLANEOUS) ×2 IMPLANT
PAD CAST 4YDX4 CTTN HI CHSV (CAST SUPPLIES) IMPLANT
SPECIMEN JAR SMALL (MISCELLANEOUS) ×1 IMPLANT
SUCTION TUBE FRAZIER 10FR DISP (SUCTIONS) IMPLANT
SUT ETHILON 2 0 FS 18 (SUTURE) IMPLANT
SUT ETHILON 2 0 PSLX (SUTURE) IMPLANT
SUT VIC AB 2-0 FS1 27 (SUTURE) IMPLANT
SYR CONTROL 10ML LL (SYRINGE) IMPLANT
TOWEL GREEN STERILE (TOWEL DISPOSABLE) ×1 IMPLANT
TOWEL GREEN STERILE FF (TOWEL DISPOSABLE) ×1 IMPLANT
TUBE CONNECTING 12X1/4 (SUCTIONS) IMPLANT
WATER STERILE IRR 1000ML POUR (IV SOLUTION) ×1 IMPLANT

## 2023-07-16 NOTE — Anesthesia Preprocedure Evaluation (Signed)
 Anesthesia Evaluation  Patient identified by MRN, date of birth, ID band Patient awake    Reviewed: Allergy & Precautions, NPO status , Patient's Chart, lab work & pertinent test results  Airway Mallampati: II  TM Distance: >3 FB Neck ROM: Full    Dental  (+) Dental Advisory Given   Pulmonary Current Smoker and Patient abstained from smoking.   breath sounds clear to auscultation       Cardiovascular hypertension, Pt. on medications  Rhythm:Regular Rate:Normal     Neuro/Psych CVA    GI/Hepatic negative GI ROS, Neg liver ROS,,,  Endo/Other  negative endocrine ROS    Renal/GU negative Renal ROS     Musculoskeletal   Abdominal   Peds  Hematology negative hematology ROS (+)   Anesthesia Other Findings   Reproductive/Obstetrics                             Anesthesia Physical Anesthesia Plan  ASA: 2  Anesthesia Plan: General   Post-op Pain Management: Regional block* and Tylenol  PO (pre-op)*   Induction: Intravenous  PONV Risk Score and Plan: 1 and Dexamethasone , Ondansetron , Treatment may vary due to age or medical condition and Midazolam   Airway Management Planned: LMA  Additional Equipment:   Intra-op Plan:   Post-operative Plan: Extubation in OR  Informed Consent: I have reviewed the patients History and Physical, chart, labs and discussed the procedure including the risks, benefits and alternatives for the proposed anesthesia with the patient or authorized representative who has indicated his/her understanding and acceptance.     Dental advisory given  Plan Discussed with: CRNA  Anesthesia Plan Comments:        Anesthesia Quick Evaluation

## 2023-07-16 NOTE — Anesthesia Procedure Notes (Signed)
 Anesthesia Regional Block: Adductor canal block   Pre-Anesthetic Checklist: , timeout performed,  Correct Patient, Correct Site, Correct Laterality,  Correct Procedure, Correct Position, site marked,  Risks and benefits discussed,  Surgical consent,  Pre-op evaluation,  At surgeon's request and post-op pain management  Laterality: Left  Prep: chloraprep       Needles:  Injection technique: Single-shot  Needle Type: Echogenic Needle     Needle Length: 9cm  Needle Gauge: 21     Additional Needles:   Procedures:,,,, ultrasound used (permanent image in chart),,    Narrative:  Start time: 07/16/2023 11:22 AM End time: 07/16/2023 11:28 AM Injection made incrementally with aspirations every 5 mL.  Performed by: Personally  Anesthesiologist: Peggy Bowens, MD

## 2023-07-16 NOTE — Op Note (Signed)
 07/16/2023  1:54 PM  PATIENT:  [name]    PRE-OPERATIVE DIAGNOSIS:  Chronic Osteomyelitis Left Tibia  POST-OPERATIVE DIAGNOSIS:  Same  PROCEDURE:  OSTEOTOMY, TIBIA  SURGEON:  Timothy Ford, MD  PHYSICIAN ASSISTANT:None ANESTHESIA:   General  PREOPERATIVE INDICATIONS:  [name] is a  43 y.o. male with a diagnosis of Chronic Osteomyelitis Left Tibia who failed conservative measures and elected for surgical management.    The risks benefits and alternatives were discussed with the patient preoperatively including but not limited to the risks of infection, bleeding, nerve injury, cardiopulmonary complications, the need for revision surgery, among others, and the patient was willing to proceed.  OPERATIVE IMPLANTS:   Implant Name Type Inv. Item Serial No. Manufacturer Lot No. LRB No. Used Action  GRAFT SKIN WND MICRO 38 - [id number] Tissue GRAFT SKIN WND MICRO 38  [employer] [id number] Left 1 Implanted    @ENCIMAGES @  OPERATIVE FINDINGS: Sinus draining tract with chronic osteomyelitis.  Bone and soft tissue sent for cultures.  OPERATIVE PROCEDURE: Patient was brought the operating room and underwent a general anesthetic.  After adequate levels anesthesia were obtained patient's left lower extremity was prepped using DuraPrep draped into a sterile field a timeout was called.  Elliptical incision was made around the ulcerative tissue this left a wound that was 11 x 5 cm.  The skin and soft tissue was resected in 1 block of tissue.  The deep soft tissue was sent for cultures.  An osteotome was used to excise the sinus tract within the tibia.  This bone was sent for cultures.  The remaining tibial bone had good bleeding no signs of any deep abscess.  The wound was irrigated with Vashe.  The bone and wound bed were filled with 38 cm of Kerecis micro graft and 1 g vancomycin  powder.  The tissue edges were undermined and local tissue transfer was used to close the wound 11 x 5 cm.   A 13 cm wound VAC was applied this had a good suction fit patient was placed in a knee immobilizer extubated taken the PACU in stable condition.   DISCHARGE PLANNING:  Antibiotic duration: Begin IV antibiotics and adjust according to culture sensitivities  Weightbearing: Weightbearing as tolerated with the knee immobilizer  Pain medication: Opioid pathway  Dressing care/ Wound VAC: Wound VAC  Ambulatory devices: Walker or crutches  Discharge to: Anticipate discharge to home once cultures are finalized and determine if IV antibiotics are necessary.  Follow-up: In the office 1 week post operative.

## 2023-07-16 NOTE — Transfer of Care (Signed)
 Immediate Anesthesia Transfer of Care Note  Patient: [name]  Procedure(s) Performed: OSTEOTOMY, TIBIA (Left)  Patient Location: PACU  Anesthesia Type:General  Level of Consciousness: awake, alert , and oriented  Airway & Oxygen Therapy: Patient Spontanous Breathing and Patient connected to face mask oxygen  Post-op Assessment: Report given to RN and Post -op Vital signs reviewed and stable  Post vital signs: Reviewed and stable  Last Vitals:  Vitals Value Taken Time  BP 154/103 07/16/23 1352  Temp    Pulse 64 07/16/23 1355  Resp 14 07/16/23 1355  SpO2 100 % 07/16/23 1355  Vitals shown include unfiled device data.  Last Pain:  Vitals:   07/16/23 1030  TempSrc:   PainSc: 0-No pain         Complications: No notable events documented.

## 2023-07-16 NOTE — H&P (Signed)
 [name] is an 43 y.o. male.   Chief Complaint: Chronic wound left tibial tubercle. HPI: Patient is a 43 year old gentleman who is seen for evaluation for left proximal tibia with history of chronic osteomyelitis. Patient did present to the emergency room was admitted to [facility] and then discharged to home. Patient received IV antibiotics while hospitalized.   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 He has never used smokeless tobacco. He reports current drug use. Drug: Marijuana. He reports that he does not drink alcohol.  Allergies:  Allergies  Allergen Reactions   Ciprofloxacin Other (See Comments)    Reaction=hypertension   Hydrocodone -Acetaminophen  Hives   Penicillins Hives and Nausea Only    Did it involve swelling of the face/tongue/throat, SOB, or low BP? N Did it involve sudden or severe rash/hives, skin peeling, or any reaction on the inside of your mouth or nose? N Did you need to seek medical attention at a hospital or doctor's office? N When did it last happen?      3 months ago If all above answers are "NO", may proceed with cephalosporin use.    Propoxyphene N-Acetaminophen  Hives   Tramadol Hcl Hives    No medications prior to admission.    No results found for this or any previous visit (from the past 48 hours). No results  found.  Review of Systems  All other systems reviewed and are negative.   There were no vitals taken for this visit. Physical Exam  Patient is alert, oriented, no adenopathy, well-dressed, normal affect, normal respiratory effort. Examination patient has a chronic ulcer over the proximal tibial tubercle.  There is hypergranulation tissue as well as callus around the wound edges that is 3 cm in diameter.  There is no ascending cellulitis.  No purulent drainage. Assessment/Plan 1. 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  Plan for initial debridement on Wednesday with obtaining deep tissue cultures and return to the operating room on Friday for repeat debridement.  Anticipate discharge to home with a PICC line.  Timothy Ford, MD 07/16/2023, 6:43 AM

## 2023-07-16 NOTE — Anesthesia Procedure Notes (Signed)
 Procedure Name: LMA Insertion Date/Time: 07/16/2023 1:12 PM  Performed by: Derrico Zhong A, CRNAPre-anesthesia Checklist: Patient identified, Emergency Drugs available, Suction available and Patient being monitored Patient Re-evaluated:Patient Re-evaluated prior to induction Oxygen Delivery Method: Circle System Utilized Preoxygenation: Pre-oxygenation with 100% oxygen Induction Type: IV induction Ventilation: Mask ventilation without difficulty LMA: LMA inserted LMA Size: 4.0 Number of attempts: 1 Airway Equipment and Method: Bite block Placement Confirmation: positive ETCO2 Tube secured with: Tape Dental Injury: Teeth and Oropharynx as per pre-operative assessment

## 2023-07-16 NOTE — Interval H&P Note (Signed)
 History and Physical Interval Note:  07/16/2023 12:53 PM  [name]  has presented today for surgery, with the diagnosis of Chronic Osteomyelitis Left Tibia.  The various methods of treatment have been discussed with the patient and family. After consideration of risks, benefits and other options for treatment, the patient has consented to  Procedure(s) with comments: OSTEOTOMY, TIBIA (Left) - PARTIAL EXCISION LEFT TIBIA as a surgical intervention.  The patient's history has been reviewed, patient examined, no change in status, stable for surgery.  I have reviewed the patient's chart and labs.  Questions were answered to the patient's satisfaction.     Eola Waldrep V Malak Orantes

## 2023-07-17 ENCOUNTER — Encounter (HOSPITAL_COMMUNITY): Payer: Self-pay | Admitting: Orthopedic Surgery

## 2023-07-17 NOTE — Evaluation (Signed)
 Physical Therapy Evaluation Patient Details Name: [name] MRN: [id number] DOB: 07-11-1980 Today's Date: 07/17/2023  History of Present Illness  43 yo male s/p L tibia osteotomy for chronic osteomyelitis 4/30. WBAT in [place]. 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  Clinical Impression  Pt presents with LLE pain, impaired gait, impaired balance with history of falls, and decreased activity tolerance. Pt to benefit from acute PT to address deficits. Pt ambulated short distance with use of RW and close guard for safety, pt with KI donned but it is missing the lateral supports, PT called ortho tech to replace as pt is prone to buckling. Pt states he is independent at baseline, PT anticipates good progress while acute with continued pain control. PT to progress mobility as tolerated, and will continue to follow acutely.          If plan is discharge home, recommend the following: A little help with walking and/or transfers;A little help with bathing/dressing/bathroom   Can travel by private vehicle        Equipment Recommendations None recommended by PT  Recommendations for Other Services       Functional Status Assessment Patient has had a recent decline in their functional status and demonstrates the ability to make significant improvements in function in a reasonable and predictable amount of time.     Precautions / Restrictions Precautions Precautions: Fall Required Braces or Orthoses: Knee Immobilizer - Left Knee Immobilizer - Left: Other (comment) (WBAT with KI donned, does not state "maintain" but assuming 24/7) Restrictions Weight Bearing Restrictions Per Provider Order: No      Mobility  Bed Mobility Overal bed mobility: Needs Assistance Bed Mobility: Supine to Sit     Supine to sit: Supervision          Transfers Overall transfer level: Needs assistance Equipment used: Rolling walker (2 wheels), Straight cane Transfers:  Sit to/from Stand Sit to Stand: Contact guard assist           General transfer comment: for safety, slow to rise. stand x2, from EOB with cane    Ambulation/Gait Ambulation/Gait assistance: Contact guard assist Gait Distance (Feet): 40 Feet Assistive device: Rolling walker (2 wheels) Gait Pattern/deviations: Step-to pattern, Step-through pattern, Decreased stride length, Trunk flexed Gait velocity: decr     General Gait Details: cues for upright posture, keeping LLE straight in KI (missing the side bars, PT called ortho tech to replace this), sequencing with LLE leading to offweight as needed. min buckling noted  Stairs            Wheelchair Mobility     Tilt Bed    Modified Rankin (Stroke Patients Only)       Balance Overall balance assessment: Needs assistance, History of Falls Sitting-balance support: No upper extremity supported, Feet supported Sitting balance-Leahy Scale: Fair     Standing balance support: During functional activity, Bilateral upper extremity supported Standing balance-Leahy Scale: Poor                               Pertinent Vitals/Pain Pain Assessment Pain Assessment: Faces Faces Pain Scale: Hurts a little bit Pain Location: LLE Pain Descriptors / Indicators: Discomfort, Grimacing, Guarding Pain Intervention(s): Limited activity within patient's tolerance, Monitored during session, Repositioned    Home Living Family/patient expects to be discharged to:: Private residence Living Arrangements: Non-relatives/Friends (roommate) Available Help at Discharge: Friend(s);Family;Available PRN/intermittently Type of Home: Apartment Home  Access: Stairs to enter Entrance Stairs-Rails: Doctor, general practice of Steps: 15   Home Layout: One level Home Equipment: Cane - single Librarian, academic (2 wheels)      Prior Function Prior Level of Function : Independent/Modified Independent;Driving;History of Falls (last  six months)             Mobility Comments: uses a cane       Extremity/Trunk Assessment   Upper Extremity Assessment Upper Extremity Assessment: Defer to OT evaluation    Lower Extremity Assessment Lower Extremity Assessment: Generalized weakness;LLE deficits/detail LLE Deficits / Details: baseline hemiparesis since age 18 LLE: Unable to fully assess due to immobilization    Cervical / Trunk Assessment Cervical / Trunk Assessment: Normal  Communication   Communication Communication: Impaired Factors Affecting Communication: Other (comment) (dysarthria at baseline)    Cognition Arousal: Alert Behavior During Therapy: WFL for tasks assessed/performed   PT - Cognitive impairments: No apparent impairments                                 Cueing       General Comments General comments (skin integrity, edema, etc.): wound vac to LLE, KI donned throughout session but would benefit from more supportive one to avoid knee flexion    Exercises     Assessment/Plan    PT Assessment Patient needs continued PT services  PT Problem List Decreased strength;Decreased mobility;Decreased range of motion;Decreased safety awareness;Impaired tone;Decreased activity tolerance;Decreased balance;Decreased knowledge of use of DME;Pain;Cardiopulmonary status limiting activity;Decreased coordination       PT Treatment Interventions DME instruction;Therapeutic exercise;Gait training;Balance training;Stair training;Therapeutic activities;Patient/family education;Functional mobility training    PT Goals (Current goals can be found in the Care Plan section)  Acute Rehab PT Goals PT Goal Formulation: With patient Time For Goal Achievement: 07/31/23 Potential to Achieve Goals: Good    Frequency Min 3X/week     Co-evaluation               AM-PAC PT "6 Clicks" Mobility  Outcome Measure Help needed turning from your back to your side while in a flat bed without using  bedrails?: A Little Help needed moving from lying on your back to sitting on the side of a flat bed without using bedrails?: A Little Help needed moving to and from a bed to a chair (including a wheelchair)?: A Little Help needed standing up from a chair using your arms (e.g., wheelchair or bedside chair)?: A Little Help needed to walk in hospital room?: A Little Help needed climbing 3-5 steps with a railing? : A Lot 6 Click Score: 17    End of Session Equipment Utilized During Treatment: Left knee immobilizer Activity Tolerance: Patient tolerated treatment well Patient left: in chair;with call bell/phone within reach;with chair alarm set Nurse Communication: Mobility status PT Visit Diagnosis: Other abnormalities of gait and mobility (R26.89);Muscle weakness (generalized) (M62.81);Pain Pain - Right/Left: Left Pain - part of body: Leg    Time: 1610-9604 PT Time Calculation (min) (ACUTE ONLY): 30 min   Charges:   PT Evaluation $PT Eval Low Complexity: 1 Low PT Treatments $Gait Training: 8-22 mins PT General Charges $$ ACUTE PT VISIT: 1 Visit         Shirlene Doughty, PT DPT Acute Rehabilitation Services Secure Chat Preferred  Office [phone]   Shalom Ware E Burnadette Carrion 07/17/2023, 11:49 AM

## 2023-07-17 NOTE — Progress Notes (Signed)
 Orthopedic Tech Progress Note Patient Details:  [name] 05-23-1980 [id number]  Ortho Devices Type of Ortho Device: Knee Immobilizer Ortho Device/Splint Location: LLE Ortho Device/Splint Interventions: Ordered, Application, Adjustment   Post Interventions Patient Tolerated: Well Instructions Provided: Care of device, Adjustment of device  [name] [name] 07/17/2023, 11:34 AM

## 2023-07-17 NOTE — Progress Notes (Signed)
 Patient ID: [name], male   DOB: [birth year], 43 y.o.   MRN: [id number] Patient is a 43 year old gentleman status post partial tibial excision of the tibial tubercle for chronic osteomyelitis of the proximal tibia.  Cultures are pending.  There is 75 cc in the wound VAC canister.  Plan to continue IV antibiotics and discharge on antibiotics pending culture sensitivities.

## 2023-07-17 NOTE — Plan of Care (Signed)
°  Problem: Education: °Goal: Knowledge of General Education information will improve °Description: Including pain rating scale, medication(s)/side effects and non-pharmacologic comfort measures °Outcome: Progressing °  °Problem: Health Behavior/Discharge Planning: °Goal: Ability to manage health-related needs will improve °Outcome: Progressing °  °Problem: Clinical Measurements: °Goal: Ability to maintain clinical measurements within normal limits will improve °Outcome: Progressing °Goal: Will remain free from infection °Outcome: Progressing °Goal: Cardiovascular complication will be avoided °Outcome: Progressing °  °Problem: Activity: °Goal: Risk for activity intolerance will decrease °Outcome: Progressing °  °Problem: Nutrition: °Goal: Adequate nutrition will be maintained °Outcome: Progressing °  °

## 2023-07-18 MED ORDER — LEVOFLOXACIN 750 MG PO TABS
750.0000 mg | ORAL_TABLET | Freq: Every day | ORAL | Status: DC
  Administered 2023-07-18 – 2023-07-19 (×2): 750 mg via ORAL
  Filled 2023-07-18 (×2): qty 1

## 2023-07-18 NOTE — Progress Notes (Signed)
 Patient ID: [name], male   DOB: 10-Apr-1980, 43 y.o.   MRN: [id number] Examination this morning there is minimal drainage in the wound VAC canister.  The wound VAC is not functioning.  I have requested a new wound VAC pump.  Cultures are showing gram-negative rods.  Ideally should be able to discharge on oral antibiotics.

## 2023-07-18 NOTE — Progress Notes (Signed)
 Physical Therapy Treatment Patient Details Name: [name] MRN: [id number] DOB: [birth year] Today's Date: 07/18/2023   History of Present Illness 43 yo male s/p L tibia osteotomy for chronic osteomyelitis 4/30. WBAT in [place]. 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    PT Comments  Pt with improved gait today, ambulating good hallway distance with no L knee buckling given properly fitting and supportive KI. Pt reporting little to no LLE pain. Pt proficiently navigated a flight of steps, good understanding of sequencing and safety. Pt states he will either d/c home to his apt with family and friend support, or to his uncle's house if needed. PT to continue to follow while acute.      If plan is discharge home, recommend the following: A little help with walking and/or transfers;A little help with bathing/dressing/bathroom   Can travel by private vehicle        Equipment Recommendations  None recommended by PT    Recommendations for Other Services       Precautions / Restrictions Precautions Precautions: Fall Required Braces or Orthoses: Knee Immobilizer - Left Knee Immobilizer - Left: Other (comment) (WBAT with KI donned, does not state "maintain" but assuming 24/7) Restrictions Weight Bearing Restrictions Per Provider Order: No LLE Weight Bearing Per Provider Order: Weight bearing as tolerated     Mobility  Bed Mobility Overal bed mobility: Modified Independent             General bed mobility comments: use of bedrail and increased time    Transfers Overall transfer level: Needs assistance Equipment used: Rolling walker (2 wheels) Transfers: Sit to/from Stand Sit to Stand: Supervision           General transfer comment: for safety, cues for hand placement when rising and sitting    Ambulation/Gait Ambulation/Gait assistance: Supervision Gait Distance (Feet): 100 Feet Assistive device: Rolling walker (2 wheels) Gait  Pattern/deviations: Step-through pattern, Decreased stride length, Trunk flexed, Antalgic Gait velocity: decr     General Gait Details: cues for upright posture, placement in RW, sequencing with LLE leading. no buckling with KI with side bars donned   Stairs Stairs: Yes Stairs assistance: Supervision Stair Management: One rail Right, Step to pattern, Forwards, With cane Number of Stairs: 12 General stair comments: cues for sequencing (up with the good, down with the bad leg leading), with good carryover into mobility.   Wheelchair Mobility     Tilt Bed    Modified Rankin (Stroke Patients Only)       Balance Overall balance assessment: Needs assistance, History of Falls Sitting-balance support: No upper extremity supported, Feet supported Sitting balance-Leahy Scale: Good     Standing balance support: During functional activity, Bilateral upper extremity supported, Reliant on assistive device for balance Standing balance-Leahy Scale: Poor                              Communication Communication Communication: Impaired Factors Affecting Communication: Other (comment) (dysarthria at baseline)  Cognition Arousal: Alert Behavior During Therapy: WFL for tasks assessed/performed   PT - Cognitive impairments: No apparent impairments                                Cueing    Exercises      General Comments General comments (skin integrity, edema, etc.): wound vac LLE, KI donned throughout session  Pertinent Vitals/Pain Pain Assessment Pain Assessment: Faces Faces Pain Scale: Hurts little more Pain Location: LLE Pain Descriptors / Indicators: Discomfort, Grimacing, Guarding Pain Intervention(s): Limited activity within patient's tolerance, Monitored during session, Repositioned    Home Living                          Prior Function            PT Goals (current goals can now be found in the care plan section) Acute Rehab  PT Goals PT Goal Formulation: With patient Time For Goal Achievement: 07/31/23 Potential to Achieve Goals: Good Progress towards PT goals: Progressing toward goals    Frequency    Min 3X/week      PT Plan      Co-evaluation              AM-PAC PT "6 Clicks" Mobility   Outcome Measure  Help needed turning from your back to your side while in a flat bed without using bedrails?: None Help needed moving from lying on your back to sitting on the side of a flat bed without using bedrails?: None Help needed moving to and from a bed to a chair (including a wheelchair)?: A Little Help needed standing up from a chair using your arms (e.g., wheelchair or bedside chair)?: A Little Help needed to walk in hospital room?: A Little Help needed climbing 3-5 steps with a railing? : A Little 6 Click Score: 20    End of Session Equipment Utilized During Treatment: Left knee immobilizer Activity Tolerance: Patient tolerated treatment well Patient left: in chair;with call bell/phone within reach;with chair alarm set Nurse Communication: Mobility status PT Visit Diagnosis: Other abnormalities of gait and mobility (R26.89);Muscle weakness (generalized) (M62.81);Pain Pain - Right/Left: Left Pain - part of body: Leg     Time: 9528-4132 PT Time Calculation (min) (ACUTE ONLY): 21 min  Charges:    $Gait Training: 8-22 mins PT General Charges $$ ACUTE PT VISIT: 1 Visit                     Shirlene Doughty, PT DPT Acute Rehabilitation Services Secure Chat Preferred  Office [phone]    Ambria Mayfield Cydney Draft 07/18/2023, 9:42 AM

## 2023-07-18 NOTE — Progress Notes (Signed)
 Patient trying to leave AMA with a complain that the was not able to get quiet environment to sleep in hospital. Patient was explained that he won't be getting any medicine prescribed by the doctor when he leaves AMA v/s getting discharge tomorrow on oral antibiotic and so got convinced to stay in hospital for one more night. Patient doesn't want anyone to disturb him till 6 am in the morning and until he calls. His wound vac pump still showing error message. Call bell within the reach.

## 2023-07-18 NOTE — Anesthesia Postprocedure Evaluation (Signed)
 Anesthesia Post Note  Patient: [name]  Procedure(s) Performed: OSTEOTOMY, TIBIA (Left)     Patient location during evaluation: PACU Anesthesia Type: General Level of consciousness: awake and alert Pain management: pain level controlled Vital Signs Assessment: post-procedure vital signs reviewed and stable Respiratory status: spontaneous breathing, nonlabored ventilation, respiratory function stable and patient connected to nasal cannula oxygen Cardiovascular status: blood pressure returned to baseline and stable Postop Assessment: no apparent nausea or vomiting Anesthetic complications: no   No notable events documented.  Last Vitals:  Vitals:   07/17/23 2023 07/18/23 0222  BP: 127/71 126/78  Pulse: 97 79  Resp: 17 16  Temp: 36.8 C 37.3 C  SpO2: 99% 98%    Last Pain:  Vitals:   07/18/23 0222  TempSrc: Oral  PainSc:    Pain Goal: Patients Stated Pain Goal: 3 (07/16/23 1445)                 Melvenia Stabs

## 2023-07-18 NOTE — Evaluation (Signed)
 Occupational Therapy Evaluation Patient Details Name: [name] MRN: [id number] DOB: 12/07/1980 Today's Date: 07/18/2023   History of Present Illness   43 yo male s/p L tibia osteotomy for chronic osteomyelitis 4/30. WBAT in [place]. 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     Clinical Impressions Patient lives with his roommate in a 1 level apartment with 15 STE and at baseline is using a cane for functional mobility and Independent in ADLs/IADLs.  Patient reports that he has a friend that will be staying with him for 2 weeks to assist him as needed for ADLs and IADLs when he returns home.  Patient  currently requires CGA for LB ADLs and verbal cues for overall safety during ADL transfers.  Patient would benefit from additional OT intervention to address functional deficits of safety, fall prevention and safety and independence with ADLs.     If plan is discharge home, recommend the following:   A little help with bathing/dressing/bathroom;Assistance with cooking/housework;Assist for transportation;Help with stairs or ramp for entrance     Functional Status Assessment   Patient has had a recent decline in their functional status and demonstrates the ability to make significant improvements in function in a reasonable and predictable amount of time.     Equipment Recommendations   BSC/3in1     Recommendations for Other Services         Precautions/Restrictions   Precautions Precautions: Fall Required Braces or Orthoses: Knee Immobilizer - Left Knee Immobilizer - Left:  (WBAT with knee immobilzer on) Restrictions Weight Bearing Restrictions Per Provider Order: No LLE Weight Bearing Per Provider Order: Weight bearing as tolerated     Mobility Bed Mobility                    Transfers Overall transfer level: Needs assistance Equipment used: Rolling walker (2 wheels) Transfers: Sit to/from Stand Sit to Stand:  Supervision                  Balance Overall balance assessment: Needs assistance, History of Falls Sitting-balance support: No upper extremity supported, Feet supported Sitting balance-Leahy Scale: Good     Standing balance support: During functional activity, Bilateral upper extremity supported, Reliant on assistive device for balance                               ADL either performed or assessed with clinical judgement   ADL Overall ADL's : Needs assistance/impaired Eating/Feeding: Independent   Grooming: Wash/dry hands;Standing;Supervision/safety   Upper Body Bathing: Set up;Sitting   Lower Body Bathing: Sit to/from stand;Contact guard assist   Upper Body Dressing : Set up;Sitting   Lower Body Dressing: Contact guard assist   Toilet Transfer: Cueing for safety;Contact guard assist   Toileting- Clothing Manipulation and Hygiene: Minimal assistance;Contact guard assist               Vision Patient Visual Report: No change from baseline Vision Assessment?: No apparent visual deficits     Perception         Praxis         Pertinent Vitals/Pain Pain Assessment Pain Assessment: 0-10 Pain Score: 2  Faces Pain Scale: Hurts a little bit Pain Location: LLE Pain Descriptors / Indicators: Discomfort Pain Intervention(s): Monitored during session     Extremity/Trunk Assessment Upper Extremity Assessment Upper Extremity Assessment: LUE deficits/detail;Generalized weakness LUE Sensation: WNL   Lower Extremity  Assessment Lower Extremity Assessment: Defer to PT evaluation LLE Deficits / Details: baseline hemiparesis since age 56   Cervical / Trunk Assessment Cervical / Trunk Assessment: Normal   Communication Communication Communication: Impaired (dysarthria at baseline) Factors Affecting Communication: Other (comment) (dysarthria at baseline)   Cognition Arousal: Alert Behavior During Therapy: WFL for tasks  assessed/performed Cognition: No apparent impairments                               Following commands: Intact       Cueing  General Comments   Cueing Techniques: Verbal cues (for overall safety with RW when transfering to toilet)  wound vac LLE, KI donned throughout session   Exercises     Shoulder Instructions      Home Living Family/patient expects to be discharged to:: Private residence Living Arrangements: Non-relatives/Friends Available Help at Discharge: Friend(s);Family;Available PRN/intermittently Type of Home: Apartment Home Access: Stairs to enter Entrance Stairs-Number of Steps: 15 Entrance Stairs-Rails: Right;Left Home Layout: One level     Bathroom Shower/Tub: Chief Strategy Officer: Standard     Home Equipment: Agricultural consultant (2 wheels);Cane - single point          Prior Functioning/Environment Prior Level of Function : Independent/Modified Independent;Driving;History of Falls (last six months)             Mobility Comments: uses a cane      OT Problem List: Decreased strength;Decreased safety awareness   OT Treatment/Interventions: Self-care/ADL training;Therapeutic exercise;Neuromuscular education;DME and/or AE instruction;Therapeutic activities      OT Goals(Current goals can be found in the care plan section)   Acute Rehab OT Goals OT Goal Formulation: With patient Time For Goal Achievement: 08/01/23 Potential to Achieve Goals: Good   OT Frequency:  Min 1X/week    Co-evaluation              AM-PAC OT "6 Clicks" Daily Activity     Outcome Measure Help from another person eating meals?: None Help from another person taking care of personal grooming?: None Help from another person toileting, which includes using toliet, bedpan, or urinal?: A Little Help from another person bathing (including washing, rinsing, drying)?: A Little Help from another person to put on and taking off regular upper body  clothing?: None Help from another person to put on and taking off regular lower body clothing?: A Little 6 Click Score: 21   End of Session Equipment Utilized During Treatment: Rolling walker (2 wheels) Nurse Communication: Mobility status  Activity Tolerance: Patient tolerated treatment well Patient left: in chair;with call bell/phone within reach;with chair alarm set  OT Visit Diagnosis: Unsteadiness on feet (R26.81);Repeated falls (R29.6);Muscle weakness (generalized) (M62.81)                Time: 1610-9604 OT Time Calculation (min): 23 min Charges:  OT General Charges $OT Visit: 1 Visit OT Evaluation $OT Eval Moderate Complexity: 1 Mod OT Treatments $Self Care/Home Management : 8-22 mins  Lovett Ruck OT/L  Lacretia Piccolo 07/18/2023, 1:01 PM

## 2023-07-18 NOTE — Progress Notes (Signed)
 IVT consult placed to obtain PIV access. This RN to bedside, pt declining start at this time.  Pt with PRN IV medications only. Pt aware to speak to primary RN should his wishes change. Primay RN notified via [place].

## 2023-07-18 NOTE — Plan of Care (Signed)

## 2023-07-19 MED ORDER — LEVOFLOXACIN 750 MG PO TABS: 750.0000 mg | ORAL_TABLET | Freq: Every day | ORAL | 0 refills | Status: AC

## 2023-07-19 MED ORDER — OXYCODONE-ACETAMINOPHEN 5-325 MG PO TABS: 1.0000 | ORAL_TABLET | ORAL | 0 refills | Status: DC | PRN

## 2023-07-19 NOTE — Progress Notes (Signed)
 Reviewed AVS, patient expressed understanding of medications, MD follow up reviewed.   Patient states all belongings brought to the hospital at time of admission are accounted for and packed to take home.  Patient informed and expressed understanding of where to pick up medications.  Pt transported to Discharge lounge to wait for transportation home.

## 2023-07-19 NOTE — Progress Notes (Signed)
 Patient ID: [name], male   DOB: Sep 12, 1980, 43 y.o.   MRN: [id number] Patient is status post debridement left proximal tibia.  Vancomycin  powder was placed in the bone.  Patient's wound VAC has been alarming and not working for 2 days now.  Patient states that he is leaving the hospital at this time.  Will discharge with Levaquin .  He does have vancomycin  within the wound.  Will adjust pending culture sensitivities.

## 2023-07-19 NOTE — Discharge Summary (Signed)
 Physician Discharge Summary  Patient ID: [name] MRN: [id number] DOB/AGE: May 08, 1980 43 y.o.  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 male who was admitted 07/16/2023 with a chief complaint of No chief complaint on file. , and found to have a diagnosis of Osteomyelitis of left tibia (HCC).  They were brought to the operating room on 07/16/2023 and underwent the above named procedures.    They were given perioperative antibiotics:  Anti-infectives (From admission, onward)    Start     Dose/Rate Route Frequency Ordered Stop   07/19/23 0000  levofloxacin  (LEVAQUIN ) 750 MG tablet        750 mg Oral Daily 07/19/23 0955 08/08/23 2359   07/18/23 1415  levofloxacin  (LEVAQUIN ) tablet 750 mg        750 mg Oral Daily 07/18/23 1327     07/16/23 1329  vancomycin  (VANCOCIN ) powder  Status:  Discontinued          As needed 07/16/23 1330 07/16/23 1348   07/16/23 1000  ceFAZolin  (ANCEF ) IVPB 2g/100 mL premix        2 g 200 mL/hr over 30 Minutes Intravenous On call to O.R. 07/16/23 0957 07/16/23 1306     .  They were given compression stockings, early ambulation, and chemoprophylaxis for DVT prophylaxis.  They benefited maximally from their hospital stay and there were no complications.    Recent vital signs:  Vitals:   07/18/23 2041 07/19/23 0924  BP: (!) 154/99 134/82  Pulse: 95 92  Resp: 18 18  Temp: 98.7 F (37.1 C) 98.1 F (36.7 C)  SpO2: 98% 96%    Recent laboratory studies:  Results for orders  placed or performed during the hospital encounter of 07/16/23  Aerobic/Anaerobic Culture w Gram Stain (surgical/deep wound)   Collection Time: 07/16/23  1:27 PM   Specimen: Soft Tissue, Other  Result Value Ref Range   Specimen Description TISSUE LEFT KNEE    Special Requests NONE    Gram Stain      NO WBC SEEN NO ORGANISMS SEEN Performed at [facility], [address], [place], [place] [address]    Culture      RARE STENOTROPHOMONAS MALTOPHILIA SUSCEPTIBILITIES TO FOLLOW NO ANAEROBES ISOLATED; CULTURE IN PROGRESS FOR 5 DAYS    Report Status PENDING     Discharge Medications:   Allergies as of 07/19/2023       Reactions   Ciprofloxacin Other (See Comments)   Reaction=hypertension   Hydrocodone -acetaminophen  Hives   Penicillins Hives, Nausea Only   Did it involve swelling of the face/tongue/throat, SOB, or low BP? N Did it involve sudden or severe rash/hives, skin peeling, or any reaction on the inside of your mouth or nose? N Did you need to seek medical attention at a hospital or doctor's office? N When did it last happen?      3 months ago If all above answers are "NO", may proceed with cephalosporin use.   Propoxyphene N-acetaminophen  Hives  Tramadol Hcl Hives        Medication List     STOP taking these medications    Doxycycline  Hyclate 50 MG Tabs       TAKE these medications    amlodipine -benazepril  2.5-10 MG capsule Commonly known as: LOTREL Take 1 capsule by mouth daily.   ibuprofen  200 MG tablet Commonly known as: ADVIL  Take 200-400 mg by mouth every 6 (six) hours as needed for moderate pain (pain score 4-6).   levofloxacin  750 MG tablet Commonly known as: Levaquin  Take 1 tablet (750 mg total) by mouth daily for 20 days.   oxyCODONE -acetaminophen  5-325 MG tablet Commonly known as: PERCOCET/ROXICET Take 1 tablet by mouth every 4 (four) hours as needed.               Durable Medical Equipment  (From admission, onward)            Start     Ordered   07/16/23 1536  For home use only DME Vac  Once        07/16/23 1535              Discharge Care Instructions  (From admission, onward)           Start     Ordered   07/19/23 0000  Change dressing       Comments: Prior to discharge remove the wound VAC dressing apply a dry dressing with 4 x 4 gauze to the incision plus an Ace wrap.   07/19/23 0955            Diagnostic Studies: DG Knee Complete 4 Views Left Result Date: 07/10/2023 CLINICAL DATA:  Knee pain, wound drainage. EXAM: LEFT KNEE - COMPLETE 4+ VIEW COMPARISON:  Left knee x-ray 06/22/2023. left knee x-ray 11/19/2021. FINDINGS: There is no acute fracture or dislocation. Joint spaces are maintained. There is soft tissue swelling and ulceration overlying the anterior tibial tuberosity. There is underlying cortical thickening and irregularity with some erosive change similar to the prior study. There is no significant joint effusion. IMPRESSION: Soft tissue swelling and ulceration overlying the anterior tibial tuberosity with underlying cortical thickening and irregularity with some erosive change similar to the prior study. Findings are concerning for osteomyelitis. Consider further evaluation with MRI to conserve firm. Electronically Signed   By: Tyron Gallon M.D.   On: 07/10/2023 20:29   MR TIBIA FIBULA LEFT W WO CONTRAST Result Date: 06/22/2023 CLINICAL DATA:  Open wound involving the anterior aspect of the proximal tibia. EXAM: MRI OF LOWER LEFT EXTREMITY WITHOUT AND WITH CONTRAST TECHNIQUE: Multiplanar, multisequence MR imaging of the left lower extremity was performed both before and after administration of intravenous contrast. CONTRAST:  7mL GADAVIST  GADOBUTROL  1 MMOL/ML IV SOLN COMPARISON:  Radiographs 06/22/2023.  Prior MRI from 2020 FINDINGS: Chronic open wound involving the anterior aspect of the upper tibial region just below the knee joint. This involves the distal aspect of the patellar  tendon which demonstrates significant septic tendinopathy and longitudinal split type tear/central defect. Abnormal T1 and T2 signal intensity in the proximal tibia along the tibial tubercle with subsequent contrast enhancement consistent with osteomyelitis. There is also extensive enhancing granulation tissue around the wound without discrete drainable soft tissue abscess. Chronic changes involving the patella possibly related to prior large osteochondral lesion, bone infarct or infection. No findings suspicious for septic arthritis involving the knee joint. No findings for internal derangement. IMPRESSION: 1. Chronic open wound involving the anterior aspect of the upper tibial  region just below the knee joint. This involves the distal aspect of the patellar tendon which demonstrates significant septic tendinopathy and longitudinal split type tear/central defect. 2. Osteomyelitis involving the proximal tibia along the tibial tubercle. 3. Extensive enhancing granulation tissue around without discrete drainable soft tissue abscess. 4. Chronic changes involving the patella possibly related to prior large osteochondral lesion, bone infarct or infection. 5. No findings suspicious for septic arthritis involving the knee joint. Electronically Signed   By: Marrian Siva M.D.   On: 06/22/2023 19:12   DG Knee Complete 4 Views Left Result Date: 06/22/2023 CLINICAL DATA:  Chronic wound. Fall a few weeks ago with infected wound to left knee. EXAM: LEFT KNEE - COMPLETE 4+ VIEW COMPARISON:  11/19/2021. FINDINGS: There is no evidence of acute fracture or dislocation. No joint effusion is seen. 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 A soft tissue defect with air is noted in the anterior aspect of the tibial tuberosity. No radiographic evidence of osteomyelitis is seen. IMPRESSION: 1. No radiographic evidence of osteomyelitis. 2. Mild-to-moderate tricompartmental  degenerative changes. Electronically Signed   By: Wyvonnia Heimlich M.D.   On: 06/22/2023 12:54    Disposition: Discharge disposition: 01-Home or Self Care       Discharge Instructions     Call MD / Call 911   Complete by: As directed    If you experience chest pain or shortness of breath, CALL 911 and be transported to the hospital emergency room.  If you develope a fever above 101 F, pus (white drainage) or increased drainage or redness at the wound, or calf pain, call your surgeon's office.   Change dressing   Complete by: As directed    Prior to discharge remove the wound VAC dressing apply a dry dressing with 4 x 4 gauze to the incision plus an Ace wrap.   Constipation Prevention   Complete by: As directed    Drink plenty of fluids.  Prune juice may be helpful.  You may use a stool softener, such as Colace (over the counter) 100 mg twice a day.  Use MiraLax  (over the counter) for constipation as needed.   Diet - low sodium heart healthy   Complete by: As directed    Increase activity slowly as tolerated   Complete by: As directed    Post-operative opioid taper instructions:   Complete by: As directed    POST-OPERATIVE OPIOID TAPER INSTRUCTIONS: It is important to wean off of your opioid medication as soon as possible. If you do not need pain medication after your surgery it is ok to stop day one. Opioids include: Codeine, Hydrocodone (Norco, Vicodin), Oxycodone (Percocet, oxycontin ) and hydromorphone  amongst others.  Long term and even short term use of opiods can cause: Increased pain response Dependence Constipation Depression Respiratory depression And more.  Withdrawal symptoms can include Flu like symptoms Nausea, vomiting And more Techniques to manage these symptoms Hydrate well Eat regular healthy meals Stay active Use relaxation techniques(deep breathing, meditating, yoga) Do Not substitute Alcohol to help with tapering If you have been on opioids for less than  two weeks and do not have pain than it is ok to stop all together.  Plan to wean off of opioids This plan should start within one week post op of your joint replacement. Maintain the same interval or time between taking each dose and first decrease the dose.  Cut the total daily intake of opioids by one tablet each day Next start  to increase the time between doses. The last dose that should be eliminated is the evening dose.           Follow-up Information     Timothy Ford, MD Follow up in 1 week(s).   Specialty: Orthopedic Surgery Contact information: [address] [place]  [place] [address] [phone]                  Signed: Timothy Ford 07/19/2023, 9:55 AM

## 2023-07-19 NOTE — Progress Notes (Signed)
 Patient was trying to leave AMA,RN tried to stop the patient by explaining to him the consequences of leaving the hospital AMA but patient still insisted that he wants leave because he is not able to get some good sleep.Charge nurse informed who came and talked to the patient as this nurse paged the doctor for a way forward because the patient had started pulling out on the wound vac.Doctor said to let the patient leave after taking off the wound vac and put a guaze dressing on the patients wound.Charge nurse managed to convince the patient to stay overnight but patient gave conditions not to be disturbed till 0600 in the morning

## 2023-07-19 NOTE — Plan of Care (Signed)
  Problem: Education: Goal: Knowledge of General Education information will improve Description: Including pain rating scale, medication(s)/side effects and non-pharmacologic comfort measures Outcome: Progressing   Problem: Health Behavior/Discharge Planning: Goal: Ability to manage health-related needs will improve 07/19/2023 0740 by Joanna Muck, RN Outcome: Progressing 07/19/2023 0740 by Joanna Muck, RN Outcome: Progressing   Problem: Clinical Measurements: Goal: Ability to maintain clinical measurements within normal limits will improve Outcome: Progressing

## 2023-07-19 NOTE — Plan of Care (Signed)
   Problem: Education: Goal: Knowledge of General Education information will improve Description Including pain rating scale, medication(s)/side effects and non-pharmacologic comfort measures Outcome: Progressing   Problem: Health Behavior/Discharge Planning: Goal: Ability to manage health-related needs will improve Outcome: Progressing

## 2023-07-21 LAB — AEROBIC/ANAEROBIC CULTURE W GRAM STAIN (SURGICAL/DEEP WOUND): Gram Stain: NONE SEEN

## 2023-07-24 ENCOUNTER — Ambulatory Visit: Payer: MEDICAID | Admitting: Orthopedic Surgery

## 2023-07-28 ENCOUNTER — Ambulatory Visit (INDEPENDENT_AMBULATORY_CARE_PROVIDER_SITE_OTHER): Payer: MEDICAID | Admitting: Orthopedic Surgery

## 2023-07-28 DIAGNOSIS — M86462 Chronic osteomyelitis with draining sinus, left tibia and fibula: Secondary | ICD-10-CM

## 2023-07-29 ENCOUNTER — Encounter: Payer: Self-pay | Admitting: Orthopedic Surgery

## 2023-07-29 NOTE — Progress Notes (Signed)
 Office Visit Note   Patient: [name]           Date of Birth: March 16, 1981           MRN: [id number] Visit Date: 07/28/2023              Requested by: Jacqulyne Maxim, MD [address] [place] [address],  [place] [address] PCP: Jacqulyne Maxim, MD  Chief Complaint  Patient presents with   Left Leg - Routine Post Op    07/16/2023 left osteotomy of tibia       HPI: Patient is a 43 year old gentleman status post ostectomy proximal tibia with application of Kerecis and antibiotics.  Patient states he has no pain patient is pleased with his progress.  Assessment & Plan: Visit Diagnoses:  1. 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  Continue with the Levaquin .  Follow-Up Instructions: Return in about 2 weeks (around 08/11/2023).   Ortho Exam  Patient is alert, oriented, no adenopathy, well-dressed, normal affect, normal respiratory effort. Wound cultures are sensitive to Levaquin .  There is also Vanc within the wound.  The wound edges are well-approximated.  There is no pain  Imaging: No results found. No images are attached to the encounter.  Labs: Lab Results  Component Value Date   ESRSEDRATE 17 (H) 01/08/2020   ESRSEDRATE 47 (H) 11/16/2018   ESRSEDRATE 10 05/05/2008   CRP 6.7 (H) 01/08/2020   CRP 13.9 (H) 11/16/2018   REPTSTATUS 07/21/2023 FINAL 07/16/2023   GRAMSTAIN NO WBC SEEN NO ORGANISMS SEEN  07/16/2023   CULT  07/16/2023    RARE STENOTROPHOMONAS MALTOPHILIA NO ANAEROBES ISOLATED Performed at [facility], [address], [place], [place] [address]    LABORGA STENOTROPHOMONAS MALTOPHILIA 07/16/2023     Lab Results  Component Value Date   ALBUMIN 4.5 07/10/2023   ALBUMIN 3.5 09/14/2022   ALBUMIN 3.1 (L) 01/08/2020    No results found for: "MG" No results found for: "VD25OH"  No results found for: "PREALBUMIN"    Latest Ref Rng &  Units 07/11/2023    5:25 AM 07/10/2023    8:07 PM 06/22/2023   11:47 AM  CBC EXTENDED  WBC 4.0 - 10.5 K/uL 8.5  11.5  8.1   RBC 4.22 - 5.81 MIL/uL 4.58  5.59  5.53   Hemoglobin 13.0 - 17.0 g/dL 06.3  01.6  01.0   HCT 39.0 - 52.0 % 39.2  47.9  47.4   Platelets 150 - 400 K/uL 356  420  434   NEUT# 1.7 - 7.7 K/uL  8.0    Lymph# 0.7 - 4.0 K/uL  2.3       There is no height or weight on file to calculate BMI.  Orders:  No orders of the defined types were placed in this encounter.  No orders of the defined types were placed in this encounter.    Procedures: No procedures performed  Clinical Data: No additional findings.  ROS:  All other systems negative, except as noted in the HPI. Review of Systems  Objective: Vital Signs: There were no vitals taken for this visit.  Specialty Comments:  No specialty comments available.  PMFS History: Patient Active Problem List   Diagnosis Date Noted   Osteomyelitis of left tibia (HCC) 07/16/2023   History of stroke 07/10/2023   Ulcer of left lower leg, with  necrosis of bone (HCC)    Sepsis (HCC)    Primary hypertension    History of encephalitis 06/23/2019   Leukocytosis 11/17/2018   Osteomyelitis (HCC) 11/16/2018   HCAP (healthcare-associated pneumonia) 06/28/2013   Chest pain, atypical 06/28/2013   SIRS (systemic inflammatory response syndrome) (HCC) 06/28/2013   Hyponatremia 06/28/2013   Headache 01/05/2013   Leg pain 01/05/2013   Dizziness 01/05/2013   ALCOHOL ABUSE, IN REMISSION 06/10/2007   CIGARETTE SMOKER 06/10/2007   Depression 06/10/2007   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   HEMIPLEGIA, SPASTIC, NONDOMINANT SIDE 06/10/2007   POSTTRAUMATIC WOUND INFECTION NEC 06/10/2007   Tobacco dependence syndrome 06/10/2007   Past Medical History:  Diagnosis Date   Depression    Encephalitis    Hypertension    Meningitis    Paralysis, 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   Stroke [facility])      Family History  Problem Relation Age of Onset   Hypertension Mother    Hypertension Father     Past Surgical History:  Procedure Laterality Date   I & D EXTREMITY Left 01/12/2020   Procedure: EXCISION DEBRIDEMENT LEFT TIBIAL TUBERCLE, PLACE ANTIBIOTIC BEADS;  Surgeon: Timothy Ford, MD;  Location: [facility] OR;  Service: Orthopedics;  Laterality: Left;   JOINT REPLACEMENT     KNEE SURGERY     TIBIA OSTEOTOMY Left 07/16/2023   Procedure: OSTEOTOMY, TIBIA;  Surgeon: Timothy Ford, MD;  Location: [facility] OR;  Service: Orthopedics;  Laterality: Left;  PARTIAL EXCISION LEFT TIBIA   Social History   Occupational History   Not on file  Tobacco Use   Smoking status: Every Day    Current packs/day: 0.25    Types: Cigarettes   Smokeless tobacco: Never  Vaping Use   Vaping status: Never Used  Substance and Sexual Activity   Alcohol use: No   Drug use: Yes    Types: Marijuana    Comment: not used in 3 weeks   Sexual activity: Not on file

## 2023-08-12 ENCOUNTER — Ambulatory Visit (INDEPENDENT_AMBULATORY_CARE_PROVIDER_SITE_OTHER): Payer: MEDICAID | Admitting: Orthopedic Surgery

## 2023-08-12 ENCOUNTER — Encounter: Payer: Self-pay | Admitting: Orthopedic Surgery

## 2023-08-12 DIAGNOSIS — M86462 Chronic osteomyelitis with draining sinus, left tibia and fibula: Secondary | ICD-10-CM

## 2023-08-12 NOTE — Progress Notes (Signed)
 Office Visit Note   Patient: [name]           Date of Birth: 02-Nov-1980           MRN: [id number] Visit Date: 08/12/2023              Requested by: Jacqulyne Maxim, MD [address] [place],  [place] [address] PCP: Jacqulyne Maxim, MD  Chief Complaint  Patient presents with   Left Leg - Routine Post Op    07/16/2023 left osteotomy of tibia       HPI: Patient is a 43 year old gentleman who is seen 4 weeks status post osteotomy tibial tubercle with resection of infected bone placement of antibiotics and tissue graft.  Patient states that he feels much better and is the first time in years he has not had knee pain.  Assessment & Plan: Visit Diagnoses:  1. 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  He will continue with his Levaquin .  Follow-Up Instructions: Return in about 2 weeks (around 08/26/2023).   Ortho Exam  Patient is alert, oriented, no adenopathy, well-dressed, normal affect, normal respiratory effort. Examination the incision is well-approximated there is no redness no cellulitis no drainage no tenderness to palpation.  Imaging: No results found. No images are attached to the encounter.  Labs: Lab Results  Component Value Date   ESRSEDRATE 17 (H) 01/08/2020   ESRSEDRATE 47 (H) 11/16/2018   ESRSEDRATE 10 05/05/2008   CRP 6.7 (H) 01/08/2020   CRP 13.9 (H) 11/16/2018   REPTSTATUS 07/21/2023 FINAL 07/16/2023   GRAMSTAIN NO WBC SEEN NO ORGANISMS SEEN  07/16/2023   CULT  07/16/2023    RARE STENOTROPHOMONAS MALTOPHILIA NO ANAEROBES ISOLATED Performed at [facility], [address] [place], [place] [address]    LABORGA STENOTROPHOMONAS MALTOPHILIA 07/16/2023     Lab Results  Component Value Date   ALBUMIN 4.5 07/10/2023   ALBUMIN 3.5 09/14/2022   ALBUMIN 3.1 (L) 01/08/2020    No results found for: "MG" No results found for:  "VD25OH"  No results found for: "PREALBUMIN"    Latest Ref Rng & Units 07/11/2023    5:25 AM 07/10/2023    8:07 PM 06/22/2023   11:47 AM  CBC EXTENDED  WBC 4.0 - 10.5 K/uL 8.5  11.5  8.1   RBC 4.22 - 5.81 MIL/uL 4.58  5.59  5.53   Hemoglobin 13.0 - 17.0 g/dL 62.1  30.8  65.7   HCT 39.0 - 52.0 % 39.2  47.9  47.4   Platelets 150 - 400 K/uL 356  420  434   NEUT# 1.7 - 7.7 K/uL  8.0    Lymph# 0.7 - 4.0 K/uL  2.3       There is no height or weight on file to calculate BMI.  Orders:  No orders of the defined types were placed in this encounter.  No orders of the defined types were placed in this encounter.    Procedures: No procedures performed  Clinical Data: No additional findings.  ROS:  All other systems negative, except as noted in the HPI. Review of Systems  Objective: Vital Signs: There were no vitals taken for this visit.  Specialty Comments:  No specialty comments available.  PMFS History: Patient Active Problem List   Diagnosis Date Noted   Osteomyelitis of left tibia (HCC) 07/16/2023   History of stroke 07/10/2023  Ulcer of left lower leg, with necrosis of bone (HCC)    Sepsis (HCC)    Primary hypertension    History of encephalitis 06/23/2019   Leukocytosis 11/17/2018   Osteomyelitis (HCC) 11/16/2018   HCAP (healthcare-associated pneumonia) 06/28/2013   Chest pain, atypical 06/28/2013   SIRS (systemic inflammatory response syndrome) (HCC) 06/28/2013   Hyponatremia 06/28/2013   Headache 01/05/2013   Leg pain 01/05/2013   Dizziness 01/05/2013   ALCOHOL ABUSE, IN REMISSION 06/10/2007   CIGARETTE SMOKER 06/10/2007   Depression 06/10/2007   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   HEMIPLEGIA, SPASTIC, NONDOMINANT SIDE 06/10/2007   POSTTRAUMATIC WOUND INFECTION NEC 06/10/2007   Tobacco dependence syndrome 06/10/2007   Past Medical History:  Diagnosis Date   Depression    Encephalitis    Hypertension    Meningitis    Paralysis, 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   Stroke [facility])     Family History  Problem Relation Age of Onset   Hypertension Mother    Hypertension Father     Past Surgical History:  Procedure Laterality Date   I & D EXTREMITY Left 01/12/2020   Procedure: EXCISION DEBRIDEMENT LEFT TIBIAL TUBERCLE, PLACE ANTIBIOTIC BEADS;  Surgeon: Timothy Ford, MD;  Location: [facility] OR;  Service: Orthopedics;  Laterality: Left;   JOINT REPLACEMENT     KNEE SURGERY     TIBIA OSTEOTOMY Left 07/16/2023   Procedure: OSTEOTOMY, TIBIA;  Surgeon: Timothy Ford, MD;  Location: [facility] OR;  Service: Orthopedics;  Laterality: Left;  PARTIAL EXCISION LEFT TIBIA   Social History   Occupational History   Not on file  Tobacco Use   Smoking status: Every Day    Current packs/day: 0.25    Types: Cigarettes   Smokeless tobacco: Never  Vaping Use   Vaping status: Never Used  Substance and Sexual Activity   Alcohol use: No   Drug use: Yes    Types: Marijuana    Comment: not used in 3 weeks   Sexual activity: Not on file

## 2023-09-01 ENCOUNTER — Ambulatory Visit (INDEPENDENT_AMBULATORY_CARE_PROVIDER_SITE_OTHER): Payer: MEDICAID | Admitting: Orthopedic Surgery

## 2023-09-01 ENCOUNTER — Encounter: Payer: Self-pay | Admitting: Orthopedic Surgery

## 2023-09-01 DIAGNOSIS — M86462 Chronic osteomyelitis with draining sinus, left tibia and fibula: Secondary | ICD-10-CM

## 2023-09-01 MED ORDER — LEVOFLOXACIN 500 MG PO TABS: 500.0000 mg | ORAL_TABLET | Freq: Every day | ORAL | 0 refills | Status: AC

## 2023-09-01 NOTE — Progress Notes (Signed)
 Office Visit Note   Patient: [name]           Date of Birth: 18-Oct-1980           MRN: [id number] Visit Date: 09/01/2023              Requested by: Jacqulyne Maxim, MD [address] [place],  [place] [address] PCP: Jacqulyne Maxim, MD  Chief Complaint  Patient presents with   Left Leg - Routine Post Op    07/16/2023 left osteotomy of tibia      HPI: Patient is a 43 year old gentleman who is seen for evaluation for left proximal tibia with history of chronic osteomyelitis.  He is S/p left proximal tibial osteotomy on 07/16/23 by Dr. Julio Ohm.  Operative culture showed STENOTROPHOMONAS MALTOPHILIA.  He was last seen on 08/12/23 and sutures were removed.  He comes intoday with reports that the incision split open the next day.  He has been using zinc  oxide on the open wound.  .    Assessment & Plan: Visit Diagnoses:  1. 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  I called in Levaquin  every day for 30 days.    Follow-Up Instructions: Return in about 2 weeks (around 09/15/2023).   Ortho Exam  Patient is alert, oriented, no adenopathy, well-dressed, normal affect, normal respiratory effort. Left tibial plateau are with white film from zinc  oxide application.  The wound was debrided and excision of necrotic tissue with rongeur instrument back to healthy tissue. Good wound bleeding.           Imaging: No results found.    Labs: Lab Results  Component Value Date   ESRSEDRATE 17 (H) 01/08/2020   ESRSEDRATE 47 (H) 11/16/2018   ESRSEDRATE 10 05/05/2008   CRP 6.7 (H) 01/08/2020   CRP 13.9 (H) 11/16/2018   REPTSTATUS 07/21/2023 FINAL 07/16/2023   GRAMSTAIN NO WBC SEEN NO ORGANISMS SEEN  07/16/2023   CULT  07/16/2023    RARE STENOTROPHOMONAS MALTOPHILIA NO ANAEROBES ISOLATED Performed at [facility] Lab, [address] [place], [place] [address]     LABORGA STENOTROPHOMONAS MALTOPHILIA 07/16/2023     Lab Results  Component Value Date   ALBUMIN 4.5 07/10/2023   ALBUMIN 3.5 09/14/2022   ALBUMIN 3.1 (L) 01/08/2020    No results found for: MG No results found for: VD25OH  No results found for: PREALBUMIN    Latest Ref Rng & Units 07/11/2023    5:25 AM 07/10/2023    8:07 PM 06/22/2023   11:47 AM  CBC EXTENDED  WBC 4.0 - 10.5 K/uL 8.5  11.5  8.1   RBC 4.22 - 5.81 MIL/uL 4.58  5.59  5.53   Hemoglobin 13.0 - 17.0 g/dL 82.9  56.2  13.0   HCT 39.0 - 52.0 % 39.2  47.9  47.4   Platelets 150 - 400 K/uL 356  420  434   NEUT# 1.7 - 7.7 K/uL  8.0    Lymph# 0.7 - 4.0 K/uL  2.3       There is no height or weight on file to calculate BMI.  Orders:  No orders of the defined types were placed in this encounter.  Meds ordered this encounter  Medications   levofloxacin  (LEVAQUIN ) 500 MG tablet    Sig: Take 1 tablet (500 mg total) by mouth daily.  Dispense:  30 tablet    Refill:  0    Supervising Provider:   DUDA, MARCUS V [[id number]]     Procedures: No procedures performed  Clinical Data: No additional findings.  ROS:  All other systems negative, except as noted in the HPI. Review of Systems  Objective: Vital Signs: There were no vitals taken for this visit.  Specialty Comments:  No specialty comments available.  PMFS History: Patient Active Problem List   Diagnosis Date Noted   Osteomyelitis of left tibia (HCC) 07/16/2023   History of stroke 07/10/2023   Ulcer of left lower leg, with necrosis of bone (HCC)    Sepsis (HCC)    Primary hypertension    History of encephalitis 06/23/2019   Leukocytosis 11/17/2018   Osteomyelitis (HCC) 11/16/2018   HCAP (healthcare-associated pneumonia) 06/28/2013   Chest pain, atypical 06/28/2013   SIRS (systemic inflammatory response syndrome) (HCC) 06/28/2013   Hyponatremia 06/28/2013   Headache 01/05/2013   Leg pain 01/05/2013   Dizziness 01/05/2013   ALCOHOL ABUSE, IN  REMISSION 06/10/2007   CIGARETTE SMOKER 06/10/2007   Depression 06/10/2007   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   HEMIPLEGIA, SPASTIC, NONDOMINANT SIDE 06/10/2007   POSTTRAUMATIC WOUND INFECTION NEC 06/10/2007   Tobacco dependence syndrome 06/10/2007   Past Medical History:  Diagnosis Date   Depression    Encephalitis    Hypertension    Meningitis    Paralysis, 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   Stroke (HCC)     Family History  Problem Relation Age of Onset   Hypertension Mother    Hypertension Father     Past Surgical History:  Procedure Laterality Date   I & D EXTREMITY Left 01/12/2020   Procedure: EXCISION DEBRIDEMENT LEFT TIBIAL TUBERCLE, PLACE ANTIBIOTIC BEADS;  Surgeon: Timothy Ford, MD;  Location: [facility] OR;  Service: Orthopedics;  Laterality: Left;   JOINT REPLACEMENT     KNEE SURGERY     TIBIA OSTEOTOMY Left 07/16/2023   Procedure: OSTEOTOMY, TIBIA;  Surgeon: Timothy Ford, MD;  Location: [facility] OR;  Service: Orthopedics;  Laterality: Left;  PARTIAL EXCISION LEFT TIBIA   Social History   Occupational History   Not on file  Tobacco Use   Smoking status: Every Day    Current packs/day: 0.25    Types: Cigarettes   Smokeless tobacco: Never  Vaping Use   Vaping status: Never Used  Substance and Sexual Activity   Alcohol use: No   Drug use: Yes    Types: Marijuana    Comment: not used in 3 weeks   Sexual activity: Not on file

## 2023-09-15 ENCOUNTER — Ambulatory Visit (INDEPENDENT_AMBULATORY_CARE_PROVIDER_SITE_OTHER): Payer: MEDICAID | Admitting: Orthopedic Surgery

## 2023-09-15 ENCOUNTER — Encounter: Payer: Self-pay | Admitting: Orthopedic Surgery

## 2023-09-15 DIAGNOSIS — M86462 Chronic osteomyelitis with draining sinus, left tibia and fibula: Secondary | ICD-10-CM

## 2023-09-15 NOTE — Progress Notes (Signed)
 Office Visit Note   Patient: [name]           Date of Birth: 1980/06/22           MRN: [id number] Visit Date: 09/15/2023              Requested by: Jolee Madelin Patch, MD [address] [place],  [place] [address] PCP: Jolee Madelin Patch, MD  Chief Complaint  Patient presents with   Left Leg - Routine Post Op    07/16/2023 left osteotomy of tibia      HPI: Patient is a 43 year old gentleman who is 2 months status post osteotomy proximal left tibia for chronic osteomyelitis.  Patient states he feels better.  He states that he is still on Levaquin .  Assessment & Plan: Visit Diagnoses:  1. 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  Complete his Levaquin .  Follow-Up Instructions: Return in about 4 weeks (around 10/13/2023).   Ortho Exam  Patient is alert, oriented, no adenopathy, well-dressed, normal affect, normal respiratory effort. Examination the wound is 0.5 x 1 cm with healthy granulation tissue.  There is no drainage no cellulitis.    Imaging: No results found. No images are attached to the encounter.  Labs: Lab Results  Component Value Date   ESRSEDRATE 17 (H) 01/08/2020   ESRSEDRATE 47 (H) 11/16/2018   ESRSEDRATE 10 05/05/2008   CRP 6.7 (H) 01/08/2020   CRP 13.9 (H) 11/16/2018   REPTSTATUS 07/21/2023 FINAL 07/16/2023   GRAMSTAIN NO WBC SEEN NO ORGANISMS SEEN  07/16/2023   CULT  07/16/2023    RARE STENOTROPHOMONAS MALTOPHILIA NO ANAEROBES ISOLATED Performed at [facility], [address], [place], [place] [address]    LABORGA STENOTROPHOMONAS MALTOPHILIA 07/16/2023     Lab Results  Component Value Date   ALBUMIN 4.5 07/10/2023   ALBUMIN 3.5 09/14/2022   ALBUMIN 3.1 (L) 01/08/2020    No results found for: MG No results found for: VD25OH  No results found for: PREALBUMIN    Latest Ref Rng & Units 07/11/2023    5:25 AM 07/10/2023     8:07 PM 06/22/2023   11:47 AM  CBC EXTENDED  WBC 4.0 - 10.5 K/uL 8.5  11.5  8.1   RBC 4.22 - 5.81 MIL/uL 4.58  5.59  5.53   Hemoglobin 13.0 - 17.0 g/dL 86.8  83.7  84.1   HCT 39.0 - 52.0 % 39.2  47.9  47.4   Platelets 150 - 400 K/uL 356  420  434   NEUT# 1.7 - 7.7 K/uL  8.0    Lymph# 0.7 - 4.0 K/uL  2.3       There is no height or weight on file to calculate BMI.  Orders:  No orders of the defined types were placed in this encounter.  No orders of the defined types were placed in this encounter.    Procedures: No procedures performed  Clinical Data: No additional findings.  ROS:  All other systems negative, except as noted in the HPI. Review of Systems  Objective: Vital Signs: There were no vitals taken for this visit.  Specialty Comments:  No specialty comments available.  PMFS History: Patient Active Problem List   Diagnosis Date Noted   Osteomyelitis of left tibia (HCC) 07/16/2023   History of stroke 07/10/2023   Ulcer of left lower leg, with necrosis of bone (HCC)  Sepsis (HCC)    Primary hypertension    History of encephalitis 06/23/2019   Leukocytosis 11/17/2018   Osteomyelitis (HCC) 11/16/2018   HCAP (healthcare-associated pneumonia) 06/28/2013   Chest pain, atypical 06/28/2013   SIRS (systemic inflammatory response syndrome) (HCC) 06/28/2013   Hyponatremia 06/28/2013   Headache 01/05/2013   Leg pain 01/05/2013   Dizziness 01/05/2013   ALCOHOL ABUSE, IN REMISSION 06/10/2007   CIGARETTE SMOKER 06/10/2007   Depression 06/10/2007   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   HEMIPLEGIA, SPASTIC, NONDOMINANT SIDE 06/10/2007   POSTTRAUMATIC WOUND INFECTION NEC 06/10/2007   Tobacco dependence syndrome 06/10/2007   Past Medical History:  Diagnosis Date   Depression    Encephalitis    Hypertension    Meningitis    Paralysis, 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   Stroke [facility])     Family History  Problem Relation Age  of Onset   Hypertension Mother    Hypertension Father     Past Surgical History:  Procedure Laterality Date   I & D EXTREMITY Left 01/12/2020   Procedure: EXCISION DEBRIDEMENT LEFT TIBIAL TUBERCLE, PLACE ANTIBIOTIC BEADS;  Surgeon: Harden Jerona GAILS, MD;  Location: [facility] OR;  Service: Orthopedics;  Laterality: Left;   JOINT REPLACEMENT     KNEE SURGERY     TIBIA OSTEOTOMY Left 07/16/2023   Procedure: OSTEOTOMY, TIBIA;  Surgeon: Harden Jerona GAILS, MD;  Location: [facility] OR;  Service: Orthopedics;  Laterality: Left;  PARTIAL EXCISION LEFT TIBIA   Social History   Occupational History   Not on file  Tobacco Use   Smoking status: Every Day    Current packs/day: 0.25    Types: Cigarettes   Smokeless tobacco: Never  Vaping Use   Vaping status: Never Used  Substance and Sexual Activity   Alcohol use: No   Drug use: Yes    Types: Marijuana    Comment: not used in 3 weeks   Sexual activity: Not on file

## 2023-10-14 ENCOUNTER — Encounter: Payer: Self-pay | Admitting: Orthopedic Surgery

## 2023-10-14 ENCOUNTER — Ambulatory Visit (INDEPENDENT_AMBULATORY_CARE_PROVIDER_SITE_OTHER): Payer: MEDICAID | Admitting: Orthopedic Surgery

## 2023-10-14 DIAGNOSIS — M86462 Chronic osteomyelitis with draining sinus, left tibia and fibula: Secondary | ICD-10-CM

## 2023-10-14 NOTE — Progress Notes (Signed)
 Office Visit Note   Patient: [name]           Date of Birth: 1980/05/08           MRN: [id number] Visit Date: 10/14/2023              Requested by: Jolee Madelin Patch, MD [address] [place],  [place] [address] PCP: Jolee Madelin Patch, MD  Chief Complaint  Patient presents with   Left Leg - Routine Post Op    07/16/2023 left osteotomy of tibia      HPI: Patient is a 43 year old gentleman who is 3 months status post debridement chronic osteomyelitis left proximal tibia.  He is currently using Vashe dressing changes.  Assessment & Plan: Visit Diagnoses:  1. 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  Patient was provided a prescription and states that it is okay for him to exercise with stretch zone.  Follow-Up Instructions: Return in about 4 weeks (around 11/11/2023).   Ortho Exam  Patient is alert, oriented, no adenopathy, well-dressed, normal affect, normal respiratory effort. Examination the incision is dry there is no cellulitis no drainage.    Imaging: No results found. No images are attached to the encounter.  Labs: Lab Results  Component Value Date   ESRSEDRATE 17 (H) 01/08/2020   ESRSEDRATE 47 (H) 11/16/2018   ESRSEDRATE 10 05/05/2008   CRP 6.7 (H) 01/08/2020   CRP 13.9 (H) 11/16/2018   REPTSTATUS 07/21/2023 FINAL 07/16/2023   GRAMSTAIN NO WBC SEEN NO ORGANISMS SEEN  07/16/2023   CULT  07/16/2023    RARE STENOTROPHOMONAS MALTOPHILIA NO ANAEROBES ISOLATED Performed at [facility], [address], [place], [place] [address]    LABORGA STENOTROPHOMONAS MALTOPHILIA 07/16/2023     Lab Results  Component Value Date   ALBUMIN 4.5 07/10/2023   ALBUMIN 3.5 09/14/2022   ALBUMIN 3.1 (L) 01/08/2020    No results found for: MG No results found for: VD25OH  No results found for: PREALBUMIN    Latest Ref Rng & Units 07/11/2023    5:25 AM 07/10/2023     8:07 PM 06/22/2023   11:47 AM  CBC EXTENDED  WBC 4.0 - 10.5 K/uL 8.5  11.5  8.1   RBC 4.22 - 5.81 MIL/uL 4.58  5.59  5.53   Hemoglobin 13.0 - 17.0 g/dL 86.8  83.7  84.1   HCT 39.0 - 52.0 % 39.2  47.9  47.4   Platelets 150 - 400 K/uL 356  420  434   NEUT# 1.7 - 7.7 K/uL  8.0    Lymph# 0.7 - 4.0 K/uL  2.3       There is no height or weight on file to calculate BMI.  Orders:  No orders of the defined types were placed in this encounter.  No orders of the defined types were placed in this encounter.    Procedures: No procedures performed  Clinical Data: No additional findings.  ROS:  All other systems negative, except as noted in the HPI. Review of Systems  Objective: Vital Signs: There were no vitals taken for this visit.  Specialty Comments:  No specialty comments available.  PMFS History: Patient Active Problem List   Diagnosis Date Noted   Osteomyelitis of left tibia (HCC) 07/16/2023   History of stroke 07/10/2023   Ulcer of left lower leg, with necrosis of bone (HCC)    Sepsis (HCC)  Primary hypertension    History of encephalitis 06/23/2019   Leukocytosis 11/17/2018   Osteomyelitis (HCC) 11/16/2018   HCAP (healthcare-associated pneumonia) 06/28/2013   Chest pain, atypical 06/28/2013   SIRS (systemic inflammatory response syndrome) (HCC) 06/28/2013   Hyponatremia 06/28/2013   Headache 01/05/2013   Leg pain 01/05/2013   Dizziness 01/05/2013   ALCOHOL ABUSE, IN REMISSION 06/10/2007   CIGARETTE SMOKER 06/10/2007   Depression 06/10/2007   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   HEMIPLEGIA, SPASTIC, NONDOMINANT SIDE 06/10/2007   POSTTRAUMATIC WOUND INFECTION NEC 06/10/2007   Tobacco dependence syndrome 06/10/2007   Past Medical History:  Diagnosis Date   Depression    Encephalitis    Hypertension    Meningitis    Paralysis, 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   Stroke [facility])     Family History  Problem Relation Age  of Onset   Hypertension Mother    Hypertension Father     Past Surgical History:  Procedure Laterality Date   I & D EXTREMITY Left 01/12/2020   Procedure: EXCISION DEBRIDEMENT LEFT TIBIAL TUBERCLE, PLACE ANTIBIOTIC BEADS;  Surgeon: Harden Jerona GAILS, MD;  Location: [facility] OR;  Service: Orthopedics;  Laterality: Left;   JOINT REPLACEMENT     KNEE SURGERY     TIBIA OSTEOTOMY Left 07/16/2023   Procedure: OSTEOTOMY, TIBIA;  Surgeon: Harden Jerona GAILS, MD;  Location: [facility] OR;  Service: Orthopedics;  Laterality: Left;  PARTIAL EXCISION LEFT TIBIA   Social History   Occupational History   Not on file  Tobacco Use   Smoking status: Every Day    Current packs/day: 0.25    Types: Cigarettes   Smokeless tobacco: Never  Vaping Use   Vaping status: Never Used  Substance and Sexual Activity   Alcohol use: No   Drug use: Yes    Types: Marijuana    Comment: not used in 3 weeks   Sexual activity: Not on file

## 2023-11-10 ENCOUNTER — Ambulatory Visit: Payer: MEDICAID | Admitting: Orthopedic Surgery

## 2023-11-19 ENCOUNTER — Other Ambulatory Visit: Payer: Self-pay

## 2023-11-19 ENCOUNTER — Encounter: Payer: Self-pay | Admitting: Family

## 2023-11-19 ENCOUNTER — Ambulatory Visit (INDEPENDENT_AMBULATORY_CARE_PROVIDER_SITE_OTHER): Payer: MEDICAID | Admitting: Family

## 2023-11-19 DIAGNOSIS — M86462 Chronic osteomyelitis with draining sinus, left tibia and fibula: Secondary | ICD-10-CM

## 2023-11-19 NOTE — Progress Notes (Signed)
 Post-Op Visit Note   Patient: [name]           Date of Birth: 1980/07/11           MRN: [id number] Visit Date: 11/19/2023 PCP: Jolee Madelin Patch, MD  Chief Complaint:  Chief Complaint  Patient presents with   Left Leg - Follow-up     07/16/2023 left osteotomy of tibia      HPI:  HPI The patient is a 43 year old gentleman who is seen about 4 months status post debridement of chronic osteomyelitis left proximal tibia he has been doing Vashe cleansing and dry dressing changes.  He is full weightbearing using a straight cane  Today complaining of chills and increased pain in the knee anteriorly Denies fevers Left Knee Exam   Range of Motion  The patient has normal left knee ROM.  Tests  Varus: negative Valgus: negative  Other  Swelling: none     On examination left knee the incision has 1 remaining open area this is about 8 mm in length 2 mm in width filled in with fibrinous tissue 1 mm in depth there is no surrounding erythema no drainage no odor  Tenderness to the quad tendon diffuse tenderness to the patella medial joint line tenderness  Visit Diagnoses:  1. 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 Concern for osteomyelitis, return of infection  Follow-Up Instructions: No follow-ups on file.   Imaging: No results found.  Orders:  Orders Placed This Encounter  Procedures   XR Knee 1-2 Views Left   No orders of the defined types were placed in this encounter.    PMFS History: Patient Active Problem List   Diagnosis Date Noted   Osteomyelitis of left tibia (HCC) 07/16/2023   History of stroke 07/10/2023   Ulcer of left lower leg, with necrosis of bone (HCC)    Sepsis (HCC)    Primary hypertension    History of encephalitis 06/23/2019   Leukocytosis 11/17/2018   Osteomyelitis (HCC) 11/16/2018   HCAP (healthcare-associated pneumonia) 06/28/2013   Chest pain,  atypical 06/28/2013   SIRS (systemic inflammatory response syndrome) (HCC) 06/28/2013   Hyponatremia 06/28/2013   Headache 01/05/2013   Leg pain 01/05/2013   Dizziness 01/05/2013   ALCOHOL ABUSE, IN REMISSION 06/10/2007   CIGARETTE SMOKER 06/10/2007   Depression 06/10/2007   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   HEMIPLEGIA, SPASTIC, NONDOMINANT SIDE 06/10/2007   POSTTRAUMATIC WOUND INFECTION NEC 06/10/2007   Tobacco dependence syndrome 06/10/2007   Past Medical History:  Diagnosis Date   Depression    Encephalitis    Hypertension    Meningitis    Paralysis, 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   Stroke (HCC)     Family History  Problem Relation Age of Onset   Hypertension Mother    Hypertension Father     Past Surgical History:  Procedure Laterality Date   I & D EXTREMITY Left 01/12/2020   Procedure: EXCISION DEBRIDEMENT LEFT TIBIAL TUBERCLE, PLACE ANTIBIOTIC BEADS;  Surgeon: Harden Jerona GAILS, MD;  Location: [facility] OR;  Service: Orthopedics;  Laterality: Left;   JOINT REPLACEMENT     KNEE SURGERY     TIBIA OSTEOTOMY Left 07/16/2023   Procedure: OSTEOTOMY, TIBIA;  Surgeon: Harden Jerona GAILS, MD;  Location: [facility] OR;  Service: Orthopedics;  Laterality: Left;  PARTIAL EXCISION LEFT TIBIA   Social History   Occupational History  Not on file  Tobacco Use   Smoking status: Every Day    Current packs/day: 0.25    Types: Cigarettes   Smokeless tobacco: Never  Vaping Use   Vaping status: Never Used  Substance and Sexual Activity   Alcohol use: No   Drug use: Yes    Types: Marijuana    Comment: not used in 3 weeks   Sexual activity: Not on file

## 2023-11-26 ENCOUNTER — Ambulatory Visit (HOSPITAL_COMMUNITY): Payer: MEDICAID

## 2023-12-04 ENCOUNTER — Ambulatory Visit (HOSPITAL_COMMUNITY)
Admission: RE | Admit: 2023-12-04 | Discharge: 2023-12-04 | Disposition: A | Discharge status: Home / Self Care | Payer: MEDICAID | Source: Ambulatory Visit | Attending: Family | Admitting: Family

## 2023-12-04 ENCOUNTER — Encounter (HOSPITAL_COMMUNITY): Payer: Self-pay

## 2023-12-04 DIAGNOSIS — M86462 Chronic osteomyelitis with draining sinus, left tibia and fibula: Secondary | ICD-10-CM | POA: Insufficient documentation

## 2023-12-05 ENCOUNTER — Ambulatory Visit: Payer: Self-pay | Admitting: Family

## 2023-12-05 NOTE — Progress Notes (Signed)
 Can we offer him an appointment with dr duda for mri review

## 2023-12-10 NOTE — Progress Notes (Signed)
 Thank you :)

## 2023-12-12 ENCOUNTER — Ambulatory Visit: Payer: MEDICAID | Admitting: Family

## 2023-12-15 ENCOUNTER — Ambulatory Visit: Payer: MEDICAID | Admitting: Orthopedic Surgery

## 2023-12-18 ENCOUNTER — Ambulatory Visit (INDEPENDENT_AMBULATORY_CARE_PROVIDER_SITE_OTHER): Payer: MEDICAID | Admitting: Orthopedic Surgery

## 2023-12-18 ENCOUNTER — Encounter: Payer: Self-pay | Admitting: Orthopedic Surgery

## 2023-12-18 DIAGNOSIS — I69998 Other sequelae following unspecified cerebrovascular disease: Secondary | ICD-10-CM | POA: Diagnosis not present

## 2023-12-18 DIAGNOSIS — R531 Weakness: Secondary | ICD-10-CM

## 2023-12-18 DIAGNOSIS — M86462 Chronic osteomyelitis with draining sinus, left tibia and fibula: Secondary | ICD-10-CM

## 2023-12-18 NOTE — Progress Notes (Signed)
 Office Visit Note   Patient: [name]           Date of Birth: [birth year]           MRN: [id number] Visit Date: 12/18/2023              Requested by: Jolee Madelin Patch, MD [address] [place],  [place] [address] PCP: Jolee Madelin Patch, MD  Chief Complaint  Patient presents with   Left Knee - Follow-up    MRI review       HPI: Discussed the use of AI scribe software for clinical note transcription with the patient, who gave verbal consent to proceed.  History of Present Illness [name] is a 43 year old male with a history of stroke who presents with recurrent falls due to left knee instability.  He experiences multiple falls per day due to his left knee giving out. He describes episodes of sweating and a sensation of his knee giving way, which has been a persistent issue for a long time. No dizziness or tripping over his foot.  He has a history of a stroke 30 years ago, which affected his left upper and lower extremities. This history is relevant as it may contribute to his current symptoms of knee instability.  He uses a cane in the opposite hand for ambulation and has a walker at home, which he uses to aid mobility. He mentions that he does not completely straighten out his left lower extremity during ambulation, and there is slight giving way of the knee.  He has previously undergone surgical interventions, and there are bone changes noted on MRI.     Assessment & Plan: Visit Diagnoses:  1. 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 MRI shows changes without abscess or osteomyelitis. Previous osteomyelitis wound healed. - Initiate physical therapy at [facility] to strengthen left knee. - Use walker for  ambulation until leg strength improves. - Schedule follow-up in two months.      Follow-Up Instructions: Return in about 2 months (around 02/17/2024).   Ortho Exam  Patient is alert, oriented, no adenopathy, well-dressed, normal affect, normal respiratory effort. Physical Exam   Review of the MRI scan shows changes from the proximal tibia on the left but no definite abscess or osteomyelitis.  Patient ambulates with a cane in the right hand he has a flexed knee gait on the left does not completely straighten his knee.  There is no redness swelling or cellulitis around the left knee.  Patient has giving way secondary to weakness in the left lower extremity status post a stroke involving the left upper and left lower extremity.    Imaging: No results found. No images are attached to the encounter.  Labs: Lab Results  Component Value Date   ESRSEDRATE 17 (H) 01/08/2020   ESRSEDRATE 47 (H) 11/16/2018   ESRSEDRATE 10 05/05/2008   CRP 6.7 (H) 01/08/2020   CRP 13.9 (H) 11/16/2018   REPTSTATUS 07/21/2023 FINAL 07/16/2023   GRAMSTAIN NO WBC SEEN NO ORGANISMS SEEN  07/16/2023   CULT  07/16/2023    RARE STENOTROPHOMONAS MALTOPHILIA NO ANAEROBES ISOLATED Performed at [facility], [address] [place], [place] [address]    LABORGA STENOTROPHOMONAS MALTOPHILIA  07/16/2023     Lab Results  Component Value Date   ALBUMIN 4.5 07/10/2023   ALBUMIN 3.5 09/14/2022   ALBUMIN 3.1 (L) 01/08/2020    No results found for: MG No results found for: VD25OH  No results found for: PREALBUMIN    Latest Ref Rng & Units 07/11/2023    5:25 AM 07/10/2023    8:07 PM 06/22/2023   11:47 AM  CBC EXTENDED  WBC 4.0 - 10.5 K/uL 8.5  11.5  8.1   RBC 4.22 - 5.81 MIL/uL 4.58  5.59  5.53   Hemoglobin 13.0 - 17.0 g/dL 86.8  83.7  84.1   HCT 39.0 - 52.0 % 39.2  47.9  47.4   Platelets 150 - 400 K/uL 356  420  434   NEUT# 1.7 - 7.7 K/uL  8.0    Lymph# 0.7 - 4.0 K/uL  2.3       There is  no height or weight on file to calculate BMI.  Orders:  No orders of the defined types were placed in this encounter.  No orders of the defined types were placed in this encounter.    Procedures: No procedures performed  Clinical Data: No additional findings.  ROS:  All other systems negative, except as noted in the HPI. Review of Systems  Objective: Vital Signs: There were no vitals taken for this visit.  Specialty Comments:  No specialty comments available.  PMFS History: Patient Active Problem List   Diagnosis Date Noted   Osteomyelitis of left tibia (HCC) 07/16/2023   History of stroke 07/10/2023   Ulcer of left lower leg, with necrosis of bone (HCC)    Sepsis (HCC)    Primary hypertension    History of encephalitis 06/23/2019   Leukocytosis 11/17/2018   Osteomyelitis (HCC) 11/16/2018   HCAP (healthcare-associated pneumonia) 06/28/2013   Chest pain, atypical 06/28/2013   SIRS (systemic inflammatory response syndrome) (HCC) 06/28/2013   Hyponatremia 06/28/2013   Headache 01/05/2013   Leg pain 01/05/2013   Dizziness 01/05/2013   ALCOHOL ABUSE, IN REMISSION 06/10/2007   CIGARETTE SMOKER 06/10/2007   Depression 06/10/2007   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   HEMIPLEGIA, SPASTIC, NONDOMINANT SIDE 06/10/2007   POSTTRAUMATIC WOUND INFECTION NEC 06/10/2007   Tobacco dependence syndrome 06/10/2007   Past Medical History:  Diagnosis Date   Depression    Encephalitis    Hypertension    Meningitis    Paralysis, 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   Stroke (HCC)     Family History  Problem Relation Age of Onset   Hypertension Mother    Hypertension Father     Past Surgical History:  Procedure Laterality Date   I & D EXTREMITY Left 01/12/2020   Procedure: EXCISION DEBRIDEMENT LEFT TIBIAL TUBERCLE, PLACE ANTIBIOTIC BEADS;  Surgeon: Harden Jerona GAILS, MD;  Location: [facility] OR;  Service: Orthopedics;  Laterality: Left;   JOINT  REPLACEMENT     KNEE SURGERY     TIBIA OSTEOTOMY Left 07/16/2023   Procedure: OSTEOTOMY, TIBIA;  Surgeon: Harden Jerona GAILS, MD;  Location: [facility] OR;  Service: Orthopedics;  Laterality: Left;  PARTIAL EXCISION LEFT TIBIA   Social History   Occupational History   Not on file  Tobacco Use   Smoking status: Every Day    Current packs/day: 0.25    Types: Cigarettes   Smokeless tobacco: Never  Vaping Use   Vaping status: Never Used  Substance and Sexual Activity   Alcohol use: No  Drug use: Yes    Types: Marijuana    Comment: not used in 3 weeks   Sexual activity: Not on file

## 2024-01-15 NOTE — Therapy (Signed)
 OUTPATIENT PHYSICAL THERAPY LOWER EXTREMITY EVALUATION   Patient Name: [name] MRN: [id number] DOB:Jul 17, 1980, 42 y.o., male Today's Date: 01/16/2024  END OF SESSION:  PT End of Session - 01/16/24 1248     Visit Number 1    Date for Recertification  02/27/24    Authorization Type [employer]    Authorization Time Period seeking auth    Progress Note Due on Visit 10    PT Start Time 1117    PT Stop Time 1159    PT Time Calculation (min) 42 min    Activity Tolerance Patient tolerated treatment well    Behavior During Therapy WFL for tasks assessed/performed          Past Medical History:  Diagnosis Date   Depression    Encephalitis    Hypertension    Meningitis    Paralysis, 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   Stroke [facility])    Past Surgical History:  Procedure Laterality Date   I & D EXTREMITY Left 01/12/2020   Procedure: EXCISION DEBRIDEMENT LEFT TIBIAL TUBERCLE, PLACE ANTIBIOTIC BEADS;  Surgeon: Harden Jerona GAILS, MD;  Location: [facility] OR;  Service: Orthopedics;  Laterality: Left;   JOINT REPLACEMENT     KNEE SURGERY     TIBIA OSTEOTOMY Left 07/16/2023   Procedure: OSTEOTOMY, TIBIA;  Surgeon: Harden Jerona GAILS, MD;  Location: [facility] OR;  Service: Orthopedics;  Laterality: Left;  PARTIAL EXCISION LEFT TIBIA   Patient Active Problem List   Diagnosis Date Noted   Osteomyelitis of left tibia (HCC) 07/16/2023   History of stroke 07/10/2023   Ulcer of left lower leg, with necrosis of bone (HCC)    Sepsis (HCC)    Primary hypertension    History of encephalitis 06/23/2019   Leukocytosis 11/17/2018   Osteomyelitis (HCC) 11/16/2018   HCAP (healthcare-associated pneumonia) 06/28/2013   Chest pain, atypical 06/28/2013   SIRS (systemic inflammatory response syndrome) (HCC) 06/28/2013   Hyponatremia 06/28/2013   Headache 01/05/2013   Leg pain 01/05/2013   Dizziness 01/05/2013   ALCOHOL ABUSE, IN REMISSION 06/10/2007   CIGARETTE  SMOKER 06/10/2007   Depression 06/10/2007   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   HEMIPLEGIA, SPASTIC, NONDOMINANT SIDE 06/10/2007   POSTTRAUMATIC WOUND INFECTION NEC 06/10/2007   Tobacco dependence syndrome 06/10/2007    PCP: Jolee Madelin Patch, MD   REFERRING PROVIDER: Harden Jerona GAILS, MD  REFERRING DIAG: [id number] (ICD-10-CM) - 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 Pt states he is dealing with low back pain that is constant and feels this is causing all of the issues with legs. Pt states his lower body is weak. Pt state he has been disabled since the incident in 1995. Pt presents with SPC, has been using it for 23 years. Pt states something happened in 2003 after he was drinking too much and has had 13 surgeries on LLE. Pt states he tries to do some exercises at home to keep strength up. Pt also states he has some good neighbors that are willing to help him if he needs.   PERTINENT HISTORY: 13 surgeries on LLE Hx of  stroke Hx of coma PAIN:  Are you having pain? Yes: NPRS scale: 4/10 Pain location: low back Pain description: constant, ache Aggravating factors: sitting up, standing in one spot for more than a minute Relieving factors: laying down  PRECAUTIONS: Fall  RED FLAGS: None   WEIGHT BEARING RESTRICTIONS: No  FALLS:  Has patient fallen in last 6 months? Yes. Number of falls way over 10 falls  LIVING ENVIRONMENT: Lives with: lives with an adult companion Lives in: House/apartment Stairs: Yes: External: 13 steps; on right going up and bilateral but cannot reach both Pt has been  living there 13 years Has following equipment at home: Single point cane  OCCUPATION: disabled  PLOF: Independent, Independent with basic ADLs, and Requires assistive device for independence  PATIENT GOALS: likes a challenge, increased LE strength, walk better, decrease low back pain  NEXT MD VISIT: December 2nd  OBJECTIVE:  Note: Objective measures were completed at Evaluation unless otherwise noted.  DIAGNOSTIC FINDINGS: CLINICAL DATA:  Open wound involving the anterior aspect of the proximal tibia.   EXAM: MRI OF LOWER LEFT EXTREMITY WITHOUT AND WITH CONTRAST   TECHNIQUE: Multiplanar, multisequence MR imaging of the left lower extremity was performed both before and after administration of intravenous contrast.   CONTRAST:  7mL GADAVIST  GADOBUTROL  1 MMOL/ML IV SOLN   COMPARISON:  Radiographs 06/22/2023.  Prior MRI from 2020   FINDINGS: Chronic open wound involving the anterior aspect of the upper tibial region just below the knee joint. This involves the distal aspect of the patellar tendon which demonstrates significant septic tendinopathy and longitudinal split type tear/central defect.   Abnormal T1 and T2 signal intensity in the proximal tibia along the tibial tubercle with subsequent contrast enhancement consistent with osteomyelitis. There is also extensive enhancing granulation tissue around the wound without discrete drainable soft tissue abscess.   Chronic changes involving the patella possibly related to prior large osteochondral lesion, bone infarct or infection.   No findings suspicious for septic arthritis involving the knee joint. No findings for internal derangement.   IMPRESSION: 1. Chronic open wound involving the anterior aspect of the upper tibial region just below the knee joint. This involves the distal aspect of the patellar tendon which demonstrates significant septic tendinopathy and longitudinal split type tear/central defect. 2.  Osteomyelitis involving the proximal tibia along the tibial tubercle. 3. Extensive enhancing granulation tissue around without discrete drainable soft tissue abscess. 4. Chronic changes involving the patella possibly related to prior large osteochondral lesion, bone infarct or infection. 5. No findings suspicious for septic arthritis involving the knee joint.  PATIENT SURVEYS:  LEFS :  37 / 80 = 46.3 %  COGNITION: Overall cognitive status: Within functional limits for tasks assessed     SENSATION: WFL  COORDINATION: Coordination limited on LUE with rapid alternating movement with supination and pronation   POSTURE: rounded shoulders, forward head, and flexed trunk   PALPATION: N/A UPPER EXTREMITY MMT:  MMT Right eval Left eval  Shoulder flexion    Shoulder extension    Shoulder abduction 4- 3+  Shoulder adduction    Shoulder extension    Shoulder internal rotation    Shoulder external rotation    Middle trapezius    Lower trapezius    Elbow flexion 4 3+  Elbow extension 4 4  Wrist flexion 4 4  Wrist extension 4   Wrist ulnar deviation    Wrist radial deviation    Wrist pronation    Wrist supination    Grip  strength     (Blank rows = not tested) LOWER EXTREMITY MMT:  Active MMT Right eval Left eval  Hip flexion 3 3  Hip extension    Hip abduction 3+ 3+  Hip adduction 3 3  Hip internal rotation    Hip external rotation    Knee flexion 4 4-  Knee extension 4 4  Ankle dorsiflexion 4+ 4  Ankle plantarflexion    Ankle inversion    Ankle eversion     (Blank rows = not tested)  FUNCTIONAL TESTS:  5 times sit to stand: 12.19s  2 minute walk test: 384 feet, abnormal pattern  GAIT: Findings: Gait Characteristics: decreased arm swing- Left, decreased stance time- Left, decreased stride length, ataxic, decreased trunk rotation, trunk flexed, and wide BOS, Distance walked: 400, Assistive device utilized:Single point cane, and Comments: pt demonstrates  neurologic type gait pattern with spastic movements with LLE, decreased knee extension bilaterally, worse on left                                                                                                                                TREATMENT DATE:  01/15/2024  Evaluation: -ROM measured, Strength assessed, HEP prescribed, pt educated on prognosis, findings, and importance of HEP compliance if given.     PATIENT EDUCATION:  Education details: Pt was educated on findings of PT evaluation, prognosis, frequency of therapy visits and rationale, attendance policy, and HEP if given.   Person educated: Patient Education method: Explanation, Actor cues, and Handouts Education comprehension: verbalized understanding, verbal cues required, and needs further education  HOME EXERCISE PROGRAM: Access Code: [email or web address] URL: [email or web address] Date: 01/16/2024 Prepared by: Lang Ada  Exercises - Supine Bridge  - 1 x daily - 7 x weekly - 3 sets - 10 reps - Supine Active Straight Leg Raise  - 1 x daily - 7 x weekly - 3 sets - 10 reps - Sit to Stand with Arms Crossed  - 1 x daily - 7 x weekly - 3 sets - 10 reps - Tricep Dip from Chair  - 1 x daily - 7 x weekly - 3 sets - 10 reps  ASSESSMENT:  CLINICAL IMPRESSION: Patient is a 43 y.o. 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   Patient demonstrates low back pain, decreased LE strength, abnormal gait pattern, and impaired balance. Patient also demonstrates difficulty with ambulation during today's session with decreased stride length and velocity noted, see above for further details on gait pattern. Patient also demonstrates inability to hold SLS for more than one second on BLE, possibly due to effects of stroke but will still set balance goal. Patient  requires education on role of PT, importance of physical activity and HEP compliance. Patient would benefit from skilled physical therapy for increased endurance with ambulation, increased  LE strength, and balance for improved gait quality, return to higher level of function with ADLs, and progress towards therapy goals.   OBJECTIVE IMPAIRMENTS: Abnormal gait, decreased activity tolerance, decreased balance, decreased coordination, decreased endurance, decreased mobility, difficulty walking, decreased ROM, decreased strength, hypomobility, impaired flexibility, and pain.   ACTIVITY LIMITATIONS: carrying, lifting, bending, standing, squatting, sleeping, stairs, transfers, and bed mobility  PARTICIPATION LIMITATIONS: meal prep, cleaning, laundry, driving, community activity, and yard work  PERSONAL FACTORS: Age, Fitness, Past/current experiences, Time since onset of injury/illness/exacerbation, and 1 comorbidity: hx of stroke/coma are also affecting patient's functional outcome.   REHAB POTENTIAL: Fair chronic in nature  CLINICAL DECISION MAKING: Stable/uncomplicated  EVALUATION COMPLEXITY: Low   GOALS: Goals reviewed with patient? No  SHORT TERM GOALS: Target date: 02/06/24  Patient will demonstrate evidence of independence with individualized HEP and will report compliance for at least 3 days per week for optimized progression towards remaining therapy goals. Baseline:  Goal status: INITIAL  2.  Patient will report no more than one fall for next three weeks at home for increased safety and improved quality of life. Baseline: falls everyday Goal status: INITIAL     LONG TERM GOALS: Target date: 02/27/24  Pt will demonstrate a an increase of at least 9 points on the LEFS for improved performance of community ambulation and ADL. Baseline: see objective Goal status: INITIAL  2.  Pt will improve 2 MWT by 50 feet in order to demonstrate improved functional ambulatory capacity in  community setting.  Baseline: see objective Goal status: INITIAL  3.  Pt will demonstrate increased bilateral knee extension during gait cycle for increased mobility and maximal efficiency of gait cycle during ambulation. Baseline: TBA Goal status: INITIAL  4.  Pt will demonstrate at least 4/5 MMT for bilateral lower extremity for increased strength during ADL and community ambulation. Baseline: see objective Goal status: INITIAL  5.  Pt will improve SLS by 3 seconds on BLE in order to improve balance during functional activities such as walking. Baseline: unable to hold more than 1 second Goal status: INITIAL    PLAN:  PT FREQUENCY: 2x/week  PT DURATION: 6 weeks  PLANNED INTERVENTIONS: 97110-Therapeutic exercises, 97530- Therapeutic activity, 97112- Neuromuscular re-education, 97535- Self Care, [id number]- Manual therapy, [phone]- Gait training, Patient/Family education, Balance training, Stair training, Joint mobilization, Joint manipulation, DME instructions, Cryotherapy, and Moist heat  PLAN FOR NEXT SESSION: Assess bilaterally knee flexion/extension ROM, progress LE strengthening and balance training   Lang Ada, PT, DPT [facility] Office: [phone] 2:41 PM, 01/16/24    Managed Medicaid Authorization Request Treatment Start Date: 02-07-2024  Visit Dx Codes: R29.898; R26.9; Z74.09  Functional Tool Score: LEFS :  37 / 80 = 46.3 %  For all possible CPT codes, reference the Planned Interventions line above.     Check all conditions that are expected to impact treatment: {Conditions expected to impact treatment:Complications related to surgery and Unknown   If treatment provided at initial evaluation, no treatment charged due to lack of authorization.

## 2024-01-16 ENCOUNTER — Encounter (HOSPITAL_COMMUNITY): Payer: Self-pay

## 2024-01-16 ENCOUNTER — Other Ambulatory Visit: Payer: Self-pay

## 2024-01-16 ENCOUNTER — Ambulatory Visit (HOSPITAL_COMMUNITY): Payer: MEDICAID | Attending: Orthopedic Surgery

## 2024-01-16 DIAGNOSIS — I69998 Other sequelae following unspecified cerebrovascular disease: Secondary | ICD-10-CM | POA: Diagnosis not present

## 2024-01-16 DIAGNOSIS — R29898 Other symptoms and signs involving the musculoskeletal system: Secondary | ICD-10-CM | POA: Insufficient documentation

## 2024-01-16 DIAGNOSIS — R269 Unspecified abnormalities of gait and mobility: Secondary | ICD-10-CM | POA: Diagnosis present

## 2024-01-16 DIAGNOSIS — R531 Weakness: Secondary | ICD-10-CM | POA: Diagnosis not present

## 2024-01-16 DIAGNOSIS — Z7409 Other reduced mobility: Secondary | ICD-10-CM | POA: Insufficient documentation

## 2024-01-16 DIAGNOSIS — M86462 Chronic osteomyelitis with draining sinus, left tibia and fibula: Secondary | ICD-10-CM | POA: Diagnosis not present

## 2024-01-19 ENCOUNTER — Encounter: Payer: Self-pay | Admitting: Radiology

## 2024-02-17 ENCOUNTER — Ambulatory Visit: Payer: MEDICAID | Admitting: Orthopedic Surgery

## 2024-02-17 DIAGNOSIS — M25561 Pain in right knee: Secondary | ICD-10-CM | POA: Diagnosis not present

## 2024-02-17 DIAGNOSIS — R531 Weakness: Secondary | ICD-10-CM

## 2024-02-17 DIAGNOSIS — I69998 Other sequelae following unspecified cerebrovascular disease: Secondary | ICD-10-CM | POA: Diagnosis not present

## 2024-02-17 DIAGNOSIS — M86462 Chronic osteomyelitis with draining sinus, left tibia and fibula: Secondary | ICD-10-CM

## 2024-02-18 ENCOUNTER — Encounter: Payer: Self-pay | Admitting: Orthopedic Surgery

## 2024-02-18 NOTE — Progress Notes (Signed)
 Office Visit Note   Patient: [name]           Date of Birth: 1981-02-19           MRN: [id number] Visit Date: 02/17/2024              Requested by: Jolee Madelin Patch, MD [address] [place],  [place] [address] PCP: Jolee Madelin Patch, MD  Chief Complaint  Patient presents with   Left Knee - Follow-up      HPI: Discussed the use of AI scribe software for clinical note transcription with the patient, who gave verbal consent to proceed.  History of Present Illness [name] is a 43 year old male with chronic osteomyelitis of the proximal left tibia who presents for follow-up of his leg wound and physical therapy progress.  The wound on his leg is healing well, and he notes that it looks better than before. He is experiencing fewer falls.  He is currently undergoing physical therapy, which he finds beneficial. He plans to continue strengthening exercises at the [facility] after completing his current therapy sessions.     Assessment & Plan: Visit Diagnoses: No diagnosis found.  Plan: Assessment and Plan Assessment & Plan Left quadriceps weakness Improvement in strength with ability to perform straight leg raise. Reduced frequency of falls. Continued physical therapy is beneficial. - Continue physical therapy. - Transition to gym-based therapy at the [facility] for strengthening exercises. - Monitor for changes in strength and report if any changes occur.  History of chronic osteomyelitis of proximal left tibia, wound healed Wound completely healed with no signs of infection. - Continue to monitor for any changes in the wound and report if any changes occur.      Follow-Up Instructions: No follow-ups on file.   Ortho Exam  Patient is alert, oriented, no adenopathy, well-dressed, normal affect, normal respiratory effort. Physical Exam MUSCULOSKELETAL: Improved gait. Straight leg raise without extensor lag. SKIN: Wound on proximal left tibia  completely healed, no redness, no cellulitis, no fluctuance, no drainage, no signs of infection.      Imaging: No results found. No images are attached to the encounter.  Labs: Lab Results  Component Value Date   ESRSEDRATE 17 (H) 01/08/2020   ESRSEDRATE 47 (H) 11/16/2018   ESRSEDRATE 10 05/05/2008   CRP 6.7 (H) 01/08/2020   CRP 13.9 (H) 11/16/2018   REPTSTATUS 07/21/2023 FINAL 07/16/2023   GRAMSTAIN NO WBC SEEN NO ORGANISMS SEEN  07/16/2023   CULT  07/16/2023    RARE STENOTROPHOMONAS MALTOPHILIA NO ANAEROBES ISOLATED Performed at [facility], [address] [place], [place] [address]    LABORGA STENOTROPHOMONAS MALTOPHILIA 07/16/2023     Lab Results  Component Value Date   ALBUMIN 4.5 07/10/2023   ALBUMIN 3.5 09/14/2022   ALBUMIN 3.1 (L) 01/08/2020    No results found for: MG No results found for: VD25OH  No results found for: PREALBUMIN    Latest Ref Rng & Units 07/11/2023    5:25 AM 07/10/2023    8:07 PM 06/22/2023   11:47 AM  CBC EXTENDED  WBC 4.0 - 10.5 K/uL 8.5  11.5  8.1   RBC 4.22 - 5.81 MIL/uL 4.58  5.59  5.53   Hemoglobin 13.0 - 17.0 g/dL 86.8  83.7  84.1   HCT 39.0 - 52.0 % 39.2  47.9  47.4   Platelets 150 - 400 K/uL 356  420  434   NEUT# 1.7 -  7.7 K/uL  8.0    Lymph# 0.7 - 4.0 K/uL  2.3       There is no height or weight on file to calculate BMI.  Orders:  No orders of the defined types were placed in this encounter.  No orders of the defined types were placed in this encounter.    Procedures: No procedures performed  Clinical Data: No additional findings.  ROS:  All other systems negative, except as noted in the HPI. Review of Systems  Objective: Vital Signs: There were no vitals taken for this visit.  Specialty Comments:  No specialty comments available.  PMFS History: Patient Active Problem List   Diagnosis Date Noted   Osteomyelitis of left tibia (HCC) 07/16/2023   History of stroke 07/10/2023   Ulcer of  left lower leg, with necrosis of bone (HCC)    Sepsis (HCC)    Primary hypertension    History of encephalitis 06/23/2019   Leukocytosis 11/17/2018   Osteomyelitis (HCC) 11/16/2018   HCAP (healthcare-associated pneumonia) 06/28/2013   Chest pain, atypical 06/28/2013   SIRS (systemic inflammatory response syndrome) (HCC) 06/28/2013   Hyponatremia 06/28/2013   Headache 01/05/2013   Leg pain 01/05/2013   Dizziness 01/05/2013   ALCOHOL ABUSE, IN REMISSION 06/10/2007   CIGARETTE SMOKER 06/10/2007   Depression 06/10/2007   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   HEMIPLEGIA, SPASTIC, NONDOMINANT SIDE 06/10/2007   POSTTRAUMATIC WOUND INFECTION NEC 06/10/2007   Tobacco dependence syndrome 06/10/2007   Past Medical History:  Diagnosis Date   Depression    Encephalitis    Hypertension    Meningitis    Paralysis, 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   Stroke (HCC)     Family History  Problem Relation Age of Onset   Hypertension Mother    Hypertension Father     Past Surgical History:  Procedure Laterality Date   I & D EXTREMITY Left 01/12/2020   Procedure: EXCISION DEBRIDEMENT LEFT TIBIAL TUBERCLE, PLACE ANTIBIOTIC BEADS;  Surgeon: Harden Jerona GAILS, MD;  Location: [facility] OR;  Service: Orthopedics;  Laterality: Left;   JOINT REPLACEMENT     KNEE SURGERY     TIBIA OSTEOTOMY Left 07/16/2023   Procedure: OSTEOTOMY, TIBIA;  Surgeon: Harden Jerona GAILS, MD;  Location: [facility] OR;  Service: Orthopedics;  Laterality: Left;  PARTIAL EXCISION LEFT TIBIA   Social History   Occupational History   Not on file  Tobacco Use   Smoking status: Every Day    Current packs/day: 0.25    Types: Cigarettes   Smokeless tobacco: Never  Vaping Use   Vaping status: Never Used  Substance and Sexual Activity   Alcohol use: No   Drug use: Yes    Types: Marijuana    Comment: not used in 3 weeks   Sexual activity: Not on file

## 2024-02-19 ENCOUNTER — Encounter (HOSPITAL_COMMUNITY): Payer: Self-pay

## 2024-02-19 ENCOUNTER — Ambulatory Visit (HOSPITAL_COMMUNITY): Payer: MEDICAID | Attending: Orthopedic Surgery

## 2024-02-19 DIAGNOSIS — R269 Unspecified abnormalities of gait and mobility: Secondary | ICD-10-CM | POA: Diagnosis present

## 2024-02-19 DIAGNOSIS — R29898 Other symptoms and signs involving the musculoskeletal system: Secondary | ICD-10-CM | POA: Diagnosis present

## 2024-02-19 DIAGNOSIS — Z7409 Other reduced mobility: Secondary | ICD-10-CM | POA: Insufficient documentation

## 2024-02-19 NOTE — Therapy (Signed)
 OUTPATIENT PHYSICAL THERAPY LOWER EXTREMITY TREATMENT   Patient Name: [name] MRN: [id number] DOB:13-Feb-1981, 43 y.o., male Today's Date: 02/19/2024  END OF SESSION:  PT End of Session - 02/19/24 1119     Visit Number 2    Date for Recertification  02/27/24    Authorization Type [employer]    Authorization Time Period 12 approved from 01/19/24-03/17/24    Authorization - Visit Number 1    Authorization - Number of Visits 12    Progress Note Due on Visit 10    PT Start Time 1119    PT Stop Time 1157    PT Time Calculation (min) 38 min    Activity Tolerance Patient tolerated treatment well    Behavior During Therapy WFL for tasks assessed/performed           Past Medical History:  Diagnosis Date   Depression    Encephalitis    Hypertension    Meningitis    Paralysis, 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   Stroke [facility])    Past Surgical History:  Procedure Laterality Date   I & D EXTREMITY Left 01/12/2020   Procedure: EXCISION DEBRIDEMENT LEFT TIBIAL TUBERCLE, PLACE ANTIBIOTIC BEADS;  Surgeon: Harden Jerona GAILS, MD;  Location: [facility] OR;  Service: Orthopedics;  Laterality: Left;   JOINT REPLACEMENT     KNEE SURGERY     TIBIA OSTEOTOMY Left 07/16/2023   Procedure: OSTEOTOMY, TIBIA;  Surgeon: Harden Jerona GAILS, MD;  Location: [facility] OR;  Service: Orthopedics;  Laterality: Left;  PARTIAL EXCISION LEFT TIBIA   Patient Active Problem List   Diagnosis Date Noted   Osteomyelitis of left tibia (HCC) 07/16/2023   History of stroke 07/10/2023   Ulcer of left lower leg, with necrosis of bone (HCC)    Sepsis (HCC)    Primary hypertension    History of encephalitis 06/23/2019   Leukocytosis 11/17/2018   Osteomyelitis (HCC) 11/16/2018   HCAP (healthcare-associated pneumonia) 06/28/2013   Chest pain, atypical 06/28/2013   SIRS (systemic inflammatory response syndrome) (HCC) 06/28/2013   Hyponatremia 06/28/2013   Headache 01/05/2013    Leg pain 01/05/2013   Dizziness 01/05/2013   ALCOHOL ABUSE, IN REMISSION 06/10/2007   CIGARETTE SMOKER 06/10/2007   Depression 06/10/2007   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   HEMIPLEGIA, SPASTIC, NONDOMINANT SIDE 06/10/2007   POSTTRAUMATIC WOUND INFECTION NEC 06/10/2007   Tobacco dependence syndrome 06/10/2007    PCP: Jolee Madelin Patch, MD   REFERRING PROVIDER: Harden Jerona GAILS, MD  REFERRING DIAG: [phone] (ICD-10-CM) - 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 Pt reports no falls in the last 2 weeks. Pt states he has been doing the HEP everyday.  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 Pt states he is dealing with low back pain that is constant and feels this is causing all of the issues with legs. Pt states his lower body is weak. Pt state he has been disabled since the incident in 1995. Pt presents with SPC, has been using it for 23 years. Pt states something happened in 2003 after he was drinking too  much and has had 13 surgeries on LLE. Pt states he tries to do some exercises at home to keep strength up. Pt also states he has some good neighbors that are willing to help him if he needs.   PERTINENT HISTORY: 13 surgeries on LLE Hx of stroke Hx of coma PAIN:  Are you having pain? Yes: NPRS scale: 4/10 Pain location: low back Pain description: constant, ache Aggravating factors: sitting up, standing in one spot for more than a minute Relieving factors: laying down  PRECAUTIONS: Fall  RED FLAGS: None   WEIGHT BEARING RESTRICTIONS: No  FALLS:  Has patient fallen in last 6  months? Yes. Number of falls way over 10 falls  LIVING ENVIRONMENT: Lives with: lives with an adult companion Lives in: House/apartment Stairs: Yes: External: 13 steps; on right going up and bilateral but cannot reach both Pt has been living there 13 years Has following equipment at home: Single point cane  OCCUPATION: disabled  PLOF: Independent, Independent with basic ADLs, and Requires assistive device for independence  PATIENT GOALS: likes a challenge, increased LE strength, walk better, decrease low back pain  NEXT MD VISIT: December 2nd  OBJECTIVE:  Note: Objective measures were completed at Evaluation unless otherwise noted.  DIAGNOSTIC FINDINGS: CLINICAL DATA:  Open wound involving the anterior aspect of the proximal tibia.   EXAM: MRI OF LOWER LEFT EXTREMITY WITHOUT AND WITH CONTRAST   TECHNIQUE: Multiplanar, multisequence MR imaging of the left lower extremity was performed both before and after administration of intravenous contrast.   CONTRAST:  7mL GADAVIST  GADOBUTROL  1 MMOL/ML IV SOLN   COMPARISON:  Radiographs 06/22/2023.  Prior MRI from 2020   FINDINGS: Chronic open wound involving the anterior aspect of the upper tibial region just below the knee joint. This involves the distal aspect of the patellar tendon which demonstrates significant septic tendinopathy and longitudinal split type tear/central defect.   Abnormal T1 and T2 signal intensity in the proximal tibia along the tibial tubercle with subsequent contrast enhancement consistent with osteomyelitis. There is also extensive enhancing granulation tissue around the wound without discrete drainable soft tissue abscess.   Chronic changes involving the patella possibly related to prior large osteochondral lesion, bone infarct or infection.   No findings suspicious for septic arthritis involving the knee joint. No findings for internal derangement.   IMPRESSION: 1. Chronic open wound involving the  anterior aspect of the upper tibial region just below the knee joint. This involves the distal aspect of the patellar tendon which demonstrates significant septic tendinopathy and longitudinal split type tear/central defect. 2. Osteomyelitis involving the proximal tibia along the tibial tubercle. 3. Extensive enhancing granulation tissue around without discrete drainable soft tissue abscess. 4. Chronic changes involving the patella possibly related to prior large osteochondral lesion, bone infarct or infection. 5. No findings suspicious for septic arthritis involving the knee joint.  PATIENT SURVEYS:  LEFS :  37 / 80 = 46.3 %  COGNITION: Overall cognitive status: Within functional limits for tasks assessed     SENSATION: WFL  COORDINATION: Coordination limited on LUE with rapid alternating movement with supination and pronation   POSTURE: rounded shoulders, forward head, and flexed trunk   PALPATION: N/A UPPER EXTREMITY MMT:  MMT Right eval Left eval  Shoulder flexion    Shoulder extension    Shoulder abduction 4- 3+  Shoulder adduction    Shoulder extension    Shoulder internal rotation    Shoulder external rotation    Middle trapezius  Lower trapezius    Elbow flexion 4 3+  Elbow extension 4 4  Wrist flexion 4 4  Wrist extension 4   Wrist ulnar deviation    Wrist radial deviation    Wrist pronation    Wrist supination    Grip strength     (Blank rows = not tested) LOWER EXTREMITY MMT:  Active MMT Right eval Left eval  Hip flexion 3 3  Hip extension    Hip abduction 3+ 3+  Hip adduction 3 3  Hip internal rotation    Hip external rotation    Knee flexion 4 (129) 4- (120)  Knee extension 4 (8 degrees from neutral) 4 (13 degrees from neutral)  Ankle dorsiflexion 4+ 4  Ankle plantarflexion    Ankle inversion    Ankle eversion     (Blank rows = not tested)  FUNCTIONAL TESTS:  5 times sit to stand: 12.19s  2 minute walk test: 384 feet, abnormal  pattern  GAIT: Findings: Gait Characteristics: decreased arm swing- Left, decreased stance time- Left, decreased stride length, ataxic, decreased trunk rotation, trunk flexed, and wide BOS, Distance walked: 400, Assistive device utilized:Single point cane, and Comments: pt demonstrates neurologic type gait pattern with spastic movements with LLE, decreased knee extension bilaterally, worse on left                                                                                                                                TREATMENT DATE:  02/19/2024  Therapeutic Exercise: -Nustep, 5 minutes, level 10 resistance, pt cued for 80 spm -Supine bridges with RTB at knees, 2 sets of 10 reps, 3 second holds, pt cued for max hip extension Neuromuscular Re-education: -Prone on elbows, 1 set of 2 reps,  60 second holds, pt cued for sequencing  -Side plank, 1 set of 3 reps of 10 second holds, bilaterally, pt cued for increased hip elevation and proper LE/UE placement -Bird dog, 1 set of 8 reps, bilaterally, pt cued for increased hip ROM and neutral spine throughout movement -Resisted walking with tidal tank at chest, lateral stepping, walking marches, 1 lap each variation 40 foot hallway  Therapeutic Activity: -Kettle bell swings, 1 sets of 10 reps, 10lb, pt cued for hip momentum and less UE strain  01/15/2024  Evaluation: -ROM measured, Strength assessed, HEP prescribed, pt educated on prognosis, findings, and importance of HEP compliance if given.     PATIENT EDUCATION:  Education details: Pt was educated on findings of PT evaluation, prognosis, frequency of therapy visits and rationale, attendance policy, and HEP if given.   Person educated: Patient Education method: Explanation, Actor cues, and Handouts Education comprehension: verbalized understanding, verbal cues required, and needs further education  HOME EXERCISE PROGRAM: Access Code: [email or web address] URL:  [email or web address] Date: 01/16/2024 Prepared by: Lang Ada  Exercises - Supine Bridge  - 1 x daily - 7 x weekly - 3 sets - 10 reps - Supine Active Straight Leg  Raise  - 1 x daily - 7 x weekly - 3 sets - 10 reps - Sit to Stand with Arms Crossed  - 1 x daily - 7 x weekly - 3 sets - 10 reps - Tricep Dip from Chair  - 1 x daily - 7 x weekly - 3 sets - 10 reps  ASSESSMENT:  CLINICAL IMPRESSION: Patient demonstrates decreased low back back, decreased LE/core strength, decreased gait quality and balance. Patient also demonstrates decreased endurance with aerobic based exercise during today's session with verbal cueing required for keeping pace. Patient able to progress dynamic balance and core activation exercises today with bridge variations and resisted walks, good performance with verbal cueing. Patient would continue to benefit from skilled physical therapy for increased endurance with ambulation, increased LE/core strength, and improved balance for improved quality of life, improved independence with gait training and continued progress towards therapy goals.   Eval: Patient is a 43 y.o. 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 Patient demonstrates low back pain, decreased LE strength, abnormal gait pattern, and impaired balance. Patient also demonstrates difficulty with ambulation during today's session with decreased stride length and velocity noted, see above for further details on gait pattern. Patient also demonstrates inability to hold SLS for more than one second on BLE, possibly due to effects of stroke but will still set balance goal. Patient requires education on role of PT, importance of physical activity and HEP compliance. Patient would benefit from skilled physical therapy for increased endurance  with ambulation, increased LE strength, and balance for improved gait quality, return to higher level of function with ADLs, and progress towards therapy goals.   OBJECTIVE IMPAIRMENTS: Abnormal gait, decreased activity tolerance, decreased balance, decreased coordination, decreased endurance, decreased mobility, difficulty walking, decreased ROM, decreased strength, hypomobility, impaired flexibility, and pain.   ACTIVITY LIMITATIONS: carrying, lifting, bending, standing, squatting, sleeping, stairs, transfers, and bed mobility  PARTICIPATION LIMITATIONS: meal prep, cleaning, laundry, driving, community activity, and yard work  PERSONAL FACTORS: Age, Fitness, Past/current experiences, Time since onset of injury/illness/exacerbation, and 1 comorbidity: hx of stroke/coma are also affecting patient's functional outcome.   REHAB POTENTIAL: Fair chronic in nature  CLINICAL DECISION MAKING: Stable/uncomplicated  EVALUATION COMPLEXITY: Low   GOALS: Goals reviewed with patient? No  SHORT TERM GOALS: Target date: 02/06/24  Patient will demonstrate evidence of independence with individualized HEP and will report compliance for at least 3 days per week for optimized progression towards remaining therapy goals. Baseline:  Goal status: INITIAL  2.  Patient will report no more than one fall for next three weeks at home for increased safety and improved quality of life. Baseline: falls everyday Goal status: INITIAL     LONG TERM GOALS: Target date: 02/27/24  Pt will demonstrate a an increase of at least 9 points on the LEFS for improved performance of community ambulation and ADL. Baseline: see objective Goal status: INITIAL  2.  Pt will improve 2 MWT by 50 feet in order to demonstrate improved functional ambulatory capacity in community setting.  Baseline: see objective Goal status: INITIAL  3.  Pt will demonstrate increased bilateral knee extension during gait cycle for increased  mobility and maximal efficiency of gait cycle during ambulation. Baseline: TBA Goal status: INITIAL  4.  Pt will demonstrate at least 4/5 MMT for bilateral lower extremity for increased strength during ADL  and community ambulation. Baseline: see objective Goal status: INITIAL  5.  Pt will improve SLS by 3 seconds on BLE in order to improve balance during functional activities such as walking. Baseline: unable to hold more than 1 second Goal status: INITIAL    PLAN:  PT FREQUENCY: 2x/week  PT DURATION: 6 weeks  PLANNED INTERVENTIONS: 97110-Therapeutic exercises, 97530- Therapeutic activity, 97112- Neuromuscular re-education, 97535- Self Care, [id number]- Manual therapy, [phone]- Gait training, Patient/Family education, Balance training, Stair training, Joint mobilization, Joint manipulation, DME instructions, Cryotherapy, and Moist heat  PLAN FOR NEXT SESSION: Assess bilaterally knee flexion/extension ROM, progress LE strengthening and balance training, gait training   Lang Ada, PT, DPT [facility] Office: [phone] 12:00 PM, 02/19/24

## 2024-02-24 ENCOUNTER — Ambulatory Visit (HOSPITAL_COMMUNITY): Payer: MEDICAID

## 2024-02-24 ENCOUNTER — Encounter (HOSPITAL_COMMUNITY): Payer: Self-pay

## 2024-02-26 ENCOUNTER — Encounter (HOSPITAL_COMMUNITY): Payer: Self-pay

## 2024-02-26 ENCOUNTER — Ambulatory Visit (HOSPITAL_COMMUNITY): Payer: MEDICAID

## 2024-02-26 DIAGNOSIS — Z7409 Other reduced mobility: Secondary | ICD-10-CM

## 2024-02-26 DIAGNOSIS — R29898 Other symptoms and signs involving the musculoskeletal system: Secondary | ICD-10-CM | POA: Diagnosis not present

## 2024-02-26 DIAGNOSIS — R269 Unspecified abnormalities of gait and mobility: Secondary | ICD-10-CM

## 2024-02-26 NOTE — Therapy (Signed)
 OUTPATIENT PHYSICAL THERAPY LOWER EXTREMITY TREATMENT   Patient Name: [name] MRN: [id number] DOB:[birth year], 43 y.o., male Today's Date: 02/26/2024  END OF SESSION:  PT End of Session - 02/26/24 1112     Visit Number 3    Number of Visits 12    Date for Recertification  03/17/24    Authorization Type [employer]    Authorization Time Period 12 approved from 01/19/24-03/17/24    Authorization - Visit Number 2    Authorization - Number of Visits 12    Progress Note Due on Visit 10    PT Start Time 1120    PT Stop Time 1206    PT Time Calculation (min) 46 min    Activity Tolerance Patient tolerated treatment well    Behavior During Therapy WFL for tasks assessed/performed           Past Medical History:  Diagnosis Date   Depression    Encephalitis    Hypertension    Meningitis    Paralysis, 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 OR;  Service: Orthopedics;  Laterality: Left;  PARTIAL EXCISION LEFT TIBIA   Patient Active Problem List   Diagnosis Date Noted   Osteomyelitis of left tibia (HCC) 07/16/2023   History of stroke 07/10/2023   Ulcer of left lower leg, with necrosis of bone (HCC)    Sepsis (HCC)    Primary hypertension    History of encephalitis 06/23/2019   Leukocytosis 11/17/2018   Osteomyelitis (HCC) 11/16/2018   HCAP (healthcare-associated pneumonia) 06/28/2013   Chest pain, atypical 06/28/2013   SIRS (systemic inflammatory response syndrome) (HCC) 06/28/2013   Hyponatremia 06/28/2013    Headache 01/05/2013   Leg pain 01/05/2013   Dizziness 01/05/2013   ALCOHOL ABUSE, IN REMISSION 06/10/2007   CIGARETTE SMOKER 06/10/2007   Depression 06/10/2007   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   HEMIPLEGIA, SPASTIC, NONDOMINANT SIDE 06/10/2007   POSTTRAUMATIC WOUND INFECTION NEC 06/10/2007   Tobacco dependence syndrome 06/10/2007    PCP: Jolee Madelin Patch, MD   REFERRING PROVIDER: Harden Jerona GAILS, MD  REFERRING DIAG: [phone] (ICD-10-CM) - 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  Exercises of compliance with HEP daily.  Feels he has improved by 68% since beginning therapy.   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 Pt states he is dealing with low back pain that is constant and feels this is causing all of the issues with legs. Pt states his lower body is weak. Pt state he has been disabled since the incident in 1995. Pt presents with SPC, has been using it for 23 years. Pt states  something happened in 2003 after he was drinking too much and has had 13 surgeries on LLE. Pt states he tries to do some exercises at home to keep strength up. Pt also states he has some good neighbors that are willing to help him if he needs.   PERTINENT HISTORY: 13 surgeries on LLE Hx of stroke Hx of coma PAIN:  Are you having pain? Yes: NPRS scale: 4/10 Pain location: low back Pain description: constant, ache Aggravating factors: sitting up, standing in one spot for more than a minute Relieving factors: laying down  PRECAUTIONS: Fall  RED FLAGS: None   WEIGHT BEARING RESTRICTIONS:  No  FALLS:  Has patient fallen in last 6 months? Yes. Number of falls way over 10 falls  LIVING ENVIRONMENT: Lives with: lives with an adult companion Lives in: House/apartment Stairs: Yes: External: 13 steps; on right going up and bilateral but cannot reach both Pt has been living there 13 years Has following equipment at home: Single point cane  OCCUPATION: disabled  PLOF: Independent, Independent with basic ADLs, and Requires assistive device for independence  PATIENT GOALS: likes a challenge, increased LE strength, walk better, decrease low back pain  NEXT MD VISIT: December 2nd  OBJECTIVE:  Note: Objective measures were completed at Evaluation unless otherwise noted.  DIAGNOSTIC FINDINGS: CLINICAL DATA:  Open wound involving the anterior aspect of the proximal tibia.   EXAM: MRI OF LOWER LEFT EXTREMITY WITHOUT AND WITH CONTRAST   TECHNIQUE: Multiplanar, multisequence MR imaging of the left lower extremity was performed both before and after administration of intravenous contrast.   CONTRAST:  7mL GADAVIST  GADOBUTROL  1 MMOL/ML IV SOLN   COMPARISON:  Radiographs 06/22/2023.  Prior MRI from 2020   FINDINGS: Chronic open wound involving the anterior aspect of the upper tibial region just below the knee joint. This involves the distal aspect of the patellar tendon which demonstrates significant septic tendinopathy and longitudinal split type tear/central defect.   Abnormal T1 and T2 signal intensity in the proximal tibia along the tibial tubercle with subsequent contrast enhancement consistent with osteomyelitis. There is also extensive enhancing granulation tissue around the wound without discrete drainable soft tissue abscess.   Chronic changes involving the patella possibly related to prior large osteochondral lesion, bone infarct or infection.   No findings suspicious for septic arthritis involving the knee joint. No findings for internal derangement.    IMPRESSION: 1. Chronic open wound involving the anterior aspect of the upper tibial region just below the knee joint. This involves the distal aspect of the patellar tendon which demonstrates significant septic tendinopathy and longitudinal split type tear/central defect. 2. Osteomyelitis involving the proximal tibia along the tibial tubercle. 3. Extensive enhancing granulation tissue around without discrete drainable soft tissue abscess. 4. Chronic changes involving the patella possibly related to prior large osteochondral lesion, bone infarct or infection. 5. No findings suspicious for septic arthritis involving the knee joint.  PATIENT SURVEYS:  LEFS :  37 / 80 = 46.3 % LEFS  Extreme difficulty/unable (0), Quite a bit of difficulty (1), Moderate difficulty (2), Little difficulty (3), No difficulty (4) Survey date:   02/26/24:  Any of your usual work, housework or school activities 3  2. Usual hobbies, recreational or sporting activities 4  3. Getting into/out of the bath 1  4. Walking between rooms 2  5. Putting on socks/shoes 4  6. Squatting  0  7. Lifting an object, like a bag of groceries from the floor 4  8. Performing  light activities around your home 4  9. Performing heavy activities around your home 3  10. Getting into/out of a car 4  11. Walking 2 blocks 1  12. Walking 1 mile 0  13. Going up/down 10 stairs (1 flight) 3  14. Standing for 1 hour 0  15.  sitting for 1 hour 4  16. Running on even ground 4  17. Running on uneven ground 4  18. Making sharp turns while running fast 0  19. Hopping  0  20. Rolling over in bed 4  Score total:  49/80= 61%      COGNITION: Overall cognitive status: Within functional limits for tasks assessed     SENSATION: WFL  COORDINATION: Coordination limited on LUE with rapid alternating movement with supination and pronation   POSTURE: rounded shoulders, forward head, and flexed trunk   PALPATION: N/A UPPER EXTREMITY  MMT:  MMT Right eval Left eval  Shoulder flexion    Shoulder extension    Shoulder abduction 4- 3+  Shoulder adduction    Shoulder extension    Shoulder internal rotation    Shoulder external rotation    Middle trapezius    Lower trapezius    Elbow flexion 4 3+  Elbow extension 4 4  Wrist flexion 4 4  Wrist extension 4   Wrist ulnar deviation    Wrist radial deviation    Wrist pronation    Wrist supination    Grip strength     (Blank rows = not tested) LOWER EXTREMITY MMT:  Active MMT Right eval Left eval Right 02/26/24: Left  02/26/24:  Hip flexion 3 3 3  extension lag 3+ extension lag  Hip extension   2+ 2+  Hip abduction 3+ 3+ 3 3  Hip adduction 3 3    Hip internal rotation      Hip external rotation      Knee flexion 4 (129) 4- (120) 4 3+  Knee extension 4 (8 degrees from neutral) 4 (13 degrees from neutral) 4+ 4+  Ankle dorsiflexion 4+ 4 4 4   Ankle plantarflexion      Ankle inversion      Ankle eversion       (Blank rows = not tested)  FUNCTIONAL TESTS:  5 times sit to stand: 12.19s  2 minute walk test: 384 feet, abnormal pattern  02/26/24: 497ft with SPC, abnormal pattern  GAIT: Findings: Gait Characteristics: decreased arm swing- Left, decreased stance time- Left, decreased stride length, ataxic, decreased trunk rotation, trunk flexed, and wide BOS, Distance walked: 400, Assistive device utilized:Single point cane, and Comments: pt demonstrates neurologic type gait pattern with spastic movements with LLE, decreased knee extension bilaterally, worse on left                                                                                                                                TREATMENT DATE:  02/26/24: 470 with SPC, abnormal pattern Reviewed  goals MMT 5STS 10.35 cueing to stand erect ROM Rt 0-129  Lt 3-135   LEFS 49/80= 61%  02/19/2024  Therapeutic Exercise: -Nustep, 5 minutes, level 10 resistance, pt cued for 80 spm -Supine  bridges with RTB at knees, 2 sets of 10 reps, 3 second holds, pt cued for max hip extension Neuromuscular Re-education: -Prone on elbows, 1 set of 2 reps,  60 second holds, pt cued for sequencing  -Side plank, 1 set of 3 reps of 10 second holds, bilaterally, pt cued for increased hip elevation and proper LE/UE placement -Bird dog, 1 set of 8 reps, bilaterally, pt cued for increased hip ROM and neutral spine throughout movement -Resisted walking with tidal tank at chest, lateral stepping, walking marches, 1 lap each variation 40 foot hallway  Therapeutic Activity: -Kettle bell swings, 1 sets of 10 reps, 10lb, pt cued for hip momentum and less UE strain  01/15/2024  Evaluation: -ROM measured, Strength assessed, HEP prescribed, pt educated on prognosis, findings, and importance of HEP compliance if given.     PATIENT EDUCATION:  Education details: Pt was educated on findings of PT evaluation, prognosis, frequency of therapy visits and rationale, attendance policy, and HEP if given.   Person educated: Patient Education method: Explanation, Actor cues, and Handouts Education comprehension: verbalized understanding, verbal cues required, and needs further education  HOME EXERCISE PROGRAM: Access Code: [email or web address] URL: [email or web address] Date: 01/16/2024 Prepared by: Lang Ada  Exercises - Supine Bridge  - 1 x daily - 7 x weekly - 3 sets - 10 reps - Supine Active Straight Leg Raise  - 1 x daily - 7 x weekly - 3 sets - 10 reps - Sit to Stand with Arms Crossed  - 1 x daily - 7 x weekly - 3 sets - 10 reps - Tricep Dip from Chair  - 1 x daily - 7 x weekly - 3 sets - 10 reps  ASSESSMENT:  CLINICAL IMPRESSION: Reviewed goals with the following findings:  Pt reports compliance with HEP daily and feels he has improved 68%.  Objective findings including abnormal gait mechanics, ROM deficitis, weak proximal LEs and impaired balance.  Pt improved self perceived functional  abilities LEFS survey though question cognitive understanding with some of his answers (ex: Quite a bit of difficulty with walking 2 blocks, though able to run even and unever terrain with no difficulty).  Pt will continue to benefit from skilled intervention to address goals unmet.   Eval: Patient is a 43 y.o. 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 Patient demonstrates low back pain, decreased LE strength, abnormal gait pattern, and impaired balance. Patient also demonstrates difficulty with ambulation during today's session with decreased stride length and velocity noted, see above for further details on gait pattern. Patient also demonstrates inability to hold SLS for more than one second on BLE, possibly due to effects of stroke but will still set balance goal. Patient requires education on role of PT, importance of physical activity and HEP compliance. Patient would benefit from skilled physical therapy for increased endurance with ambulation, increased LE strength, and balance for improved gait quality, return to higher level of function with ADLs, and progress towards therapy goals.   OBJECTIVE IMPAIRMENTS: Abnormal gait, decreased activity tolerance, decreased balance, decreased coordination, decreased endurance, decreased mobility, difficulty walking, decreased ROM, decreased strength, hypomobility, impaired flexibility, and pain.  ACTIVITY LIMITATIONS: carrying, lifting, bending, standing, squatting, sleeping, stairs, transfers, and bed mobility  PARTICIPATION LIMITATIONS: meal prep, cleaning, laundry, driving, community activity, and yard work  PERSONAL FACTORS: Age, Fitness, Past/current experiences, Time since onset of injury/illness/exacerbation, and 1 comorbidity: hx of stroke/coma are also affecting patient's  functional outcome.   REHAB POTENTIAL: Fair chronic in nature  CLINICAL DECISION MAKING: Stable/uncomplicated  EVALUATION COMPLEXITY: Low   GOALS: Goals reviewed with patient? No  SHORT TERM GOALS: Target date: 02/06/24  Patient will demonstrate evidence of independence with individualized HEP and will report compliance for at least 3 days per week for optimized progression towards remaining therapy goals. Baseline: 02/26/24:  Reports compliance with HEP daily. Goal status: MET  2.  Patient will report no more than one fall for next three weeks at home for increased safety and improved quality of life. Baseline: falls everyday; 02/26/24:  Reports 2 falls in the last 3 weeks due to knee buckling, able to get up on his own Goal status: IN PROGRESS     LONG TERM GOALS: Target date: 02/27/24  Pt will demonstrate a an increase of at least 9 points on the LEFS for improved performance of community ambulation and ADL. Baseline: see objective; 02/26/24:  Improved 12 points Goal status: MET  2.  Pt will improve 2 MWT by 50 feet in order to demonstrate improved functional ambulatory capacity in community setting.  Baseline: see objective; 02/26/24:  Increased by 33ft with Goal status: MET  3.  Pt will demonstrate increased bilateral knee extension during gait cycle for increased mobility and maximal efficiency of gait cycle during ambulation. Baseline: TBA, 02/26/24:  Improved ROM though continues to present with extension lag during MMT and gait Goal status: IN PROGRESS  4.  Pt will demonstrate at least 4/5 MMT for bilateral lower extremity for increased strength during ADL and community ambulation. Baseline: see objective Goal status: IN PROGRESS  5.  Pt will improve SLS by 3 seconds on BLE in order to improve balance during functional activities such as walking. Baseline: unable to hold more than 1 second; 02/26/24:  unable to hold more than 1 second Goal status: IN  PROGRESS    PLAN:  PT FREQUENCY: 2x/week  PT DURATION: 6 weeks  PLANNED INTERVENTIONS: 97110-Therapeutic exercises, 97530- Therapeutic activity, 97112- Neuromuscular re-education, 97535- Self Care, [id number]- Manual therapy, [phone]- Gait training, Patient/Family education, Balance training, Stair training, Joint mobilization, Joint manipulation, DME instructions, Cryotherapy, and Moist heat  PLAN FOR NEXT SESSION: Assess bilaterally knee flexion/extension ROM, progress LE strengthening and balance training, gait training  Augustin Mclean, LPTA/CLT; CBIS [phone]  2:02 PM, 02/26/2024

## 2024-03-02 ENCOUNTER — Encounter (HOSPITAL_COMMUNITY): Payer: Self-pay

## 2024-03-02 ENCOUNTER — Ambulatory Visit (HOSPITAL_COMMUNITY): Payer: MEDICAID

## 2024-03-02 DIAGNOSIS — R269 Unspecified abnormalities of gait and mobility: Secondary | ICD-10-CM

## 2024-03-02 DIAGNOSIS — R29898 Other symptoms and signs involving the musculoskeletal system: Secondary | ICD-10-CM | POA: Diagnosis not present

## 2024-03-02 DIAGNOSIS — Z7409 Other reduced mobility: Secondary | ICD-10-CM

## 2024-03-02 NOTE — Therapy (Signed)
 OUTPATIENT PHYSICAL THERAPY LOWER EXTREMITY TREATMENT   Patient Name: [name] MRN: [id number] DOB:[birth year], 43 y.o., male Today's Date: 03/02/2024  END OF SESSION:  PT End of Session - 03/02/24 1116     Visit Number 4    Number of Visits 12    Date for Recertification  03/17/24    Authorization Type [employer]    Authorization Time Period 12 approved from 01/19/24-03/17/24    Authorization - Visit Number 3    Authorization - Number of Visits 12    Progress Note Due on Visit 10    PT Start Time 1116    PT Stop Time 1154    PT Time Calculation (min) 38 min    Activity Tolerance Patient tolerated treatment well    Behavior During Therapy WFL for tasks assessed/performed            Past Medical History:  Diagnosis Date   Depression    Encephalitis    Hypertension    Meningitis    Paralysis, 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   Stroke [facility])    Past Surgical History:  Procedure Laterality Date   I & D EXTREMITY Left 01/12/2020   Procedure: EXCISION DEBRIDEMENT LEFT TIBIAL TUBERCLE, PLACE ANTIBIOTIC BEADS;  Surgeon: Harden Jerona GAILS, MD;  Location: [facility] OR;  Service: Orthopedics;  Laterality: Left;   JOINT REPLACEMENT     KNEE SURGERY     TIBIA OSTEOTOMY Left 07/16/2023   Procedure: OSTEOTOMY, TIBIA;  Surgeon: Harden Jerona GAILS, MD;  Location: [facility] OR;  Service: Orthopedics;  Laterality: Left;  PARTIAL EXCISION LEFT TIBIA   Patient Active Problem List   Diagnosis Date Noted   Osteomyelitis of left tibia (HCC) 07/16/2023   History of stroke 07/10/2023   Ulcer of left lower leg, with necrosis of bone (HCC)    Sepsis (HCC)    Primary hypertension    History of encephalitis 06/23/2019   Leukocytosis 11/17/2018   Osteomyelitis (HCC) 11/16/2018   HCAP (healthcare-associated pneumonia) 06/28/2013   Chest pain, atypical 06/28/2013   SIRS (systemic inflammatory response syndrome) (HCC) 06/28/2013   Hyponatremia 06/28/2013    Headache 01/05/2013   Leg pain 01/05/2013   Dizziness 01/05/2013   ALCOHOL ABUSE, IN REMISSION 06/10/2007   CIGARETTE SMOKER 06/10/2007   Depression 06/10/2007   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   HEMIPLEGIA, SPASTIC, NONDOMINANT SIDE 06/10/2007   POSTTRAUMATIC WOUND INFECTION NEC 06/10/2007   Tobacco dependence syndrome 06/10/2007    PCP: Jolee Madelin Patch, MD   REFERRING PROVIDER: Harden Jerona GAILS, MD  REFERRING DIAG: [phone] (ICD-10-CM) - 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 Pt states he does HEP 6 days per week. Pt states he has had no falls.    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 Pt states he is dealing with low back pain that is constant and feels this is causing all of the issues with legs. Pt states his lower body is  weak. Pt state he has been disabled since the incident in 1995. Pt presents with SPC, has been using it for 23 years. Pt states something happened in 2003 after he was drinking too much and has had 13 surgeries on LLE. Pt states he tries to do some exercises at home to keep strength up. Pt also states he has some good neighbors that are willing to help him if he needs.   PERTINENT HISTORY: 13 surgeries on LLE Hx of stroke Hx of coma PAIN:  Are you having pain? Yes: NPRS scale: 4/10 Pain location: low back Pain description: constant, ache Aggravating factors: sitting up, standing in one spot for more than a  minute Relieving factors: laying down  PRECAUTIONS: Fall  RED FLAGS: None   WEIGHT BEARING RESTRICTIONS: No  FALLS:  Has patient fallen in last 6 months? Yes. Number of falls way over 10 falls  LIVING ENVIRONMENT: Lives with: lives with an adult companion Lives in: House/apartment Stairs: Yes: External: 13 steps; on right going up and bilateral but cannot reach both Pt has been living there 13 years Has following equipment at home: Single point cane  OCCUPATION: disabled  PLOF: Independent, Independent with basic ADLs, and Requires assistive device for independence  PATIENT GOALS: likes a challenge, increased LE strength, walk better, decrease low back pain  NEXT MD VISIT: December 2nd  OBJECTIVE:  Note: Objective measures were completed at Evaluation unless otherwise noted.  DIAGNOSTIC FINDINGS: CLINICAL DATA:  Open wound involving the anterior aspect of the proximal tibia.   EXAM: MRI OF LOWER LEFT EXTREMITY WITHOUT AND WITH CONTRAST   TECHNIQUE: Multiplanar, multisequence MR imaging of the left lower extremity was performed both before and after administration of intravenous contrast.   CONTRAST:  7mL GADAVIST  GADOBUTROL  1 MMOL/ML IV SOLN   COMPARISON:  Radiographs 06/22/2023.  Prior MRI from 2020   FINDINGS: Chronic open wound involving the anterior aspect of the upper tibial region just below the knee joint. This involves the distal aspect of the patellar tendon which demonstrates significant septic tendinopathy and longitudinal split type tear/central defect.   Abnormal T1 and T2 signal intensity in the proximal tibia along the tibial tubercle with subsequent contrast enhancement consistent with osteomyelitis. There is also extensive enhancing granulation tissue around the wound without discrete drainable soft tissue abscess.   Chronic changes involving the patella possibly related to prior large osteochondral lesion, bone infarct or infection.   No  findings suspicious for septic arthritis involving the knee joint. No findings for internal derangement.   IMPRESSION: 1. Chronic open wound involving the anterior aspect of the upper tibial region just below the knee joint. This involves the distal aspect of the patellar tendon which demonstrates significant septic tendinopathy and longitudinal split type tear/central defect. 2. Osteomyelitis involving the proximal tibia along the tibial tubercle. 3. Extensive enhancing granulation tissue around without discrete drainable soft tissue abscess. 4. Chronic changes involving the patella possibly related to prior large osteochondral lesion, bone infarct or infection. 5. No findings suspicious for septic arthritis involving the knee joint.  PATIENT SURVEYS:  LEFS :  37 / 80 = 46.3 % LEFS  Extreme difficulty/unable (0), Quite a bit of difficulty (1), Moderate difficulty (2), Little difficulty (3), No difficulty (4) Survey date:   02/26/24:  Any of your usual work, housework or school activities 3  2. Usual hobbies, recreational or sporting activities 4  3. Getting into/out of the bath 1  4. Walking between rooms 2  5. Putting  on socks/shoes 4  6. Squatting  0  7. Lifting an object, like a bag of groceries from the floor 4  8. Performing light activities around your home 4  9. Performing heavy activities around your home 3  10. Getting into/out of a car 4  11. Walking 2 blocks 1  12. Walking 1 mile 0  13. Going up/down 10 stairs (1 flight) 3  14. Standing for 1 hour 0  15.  sitting for 1 hour 4  16. Running on even ground 4  17. Running on uneven ground 4  18. Making sharp turns while running fast 0  19. Hopping  0  20. Rolling over in bed 4  Score total:  49/80= 61%      COGNITION: Overall cognitive status: Within functional limits for tasks assessed     SENSATION: WFL  COORDINATION: Coordination limited on LUE with rapid alternating movement with supination and  pronation   POSTURE: rounded shoulders, forward head, and flexed trunk   PALPATION: N/A UPPER EXTREMITY MMT:  MMT Right eval Left eval  Shoulder flexion    Shoulder extension    Shoulder abduction 4- 3+  Shoulder adduction    Shoulder extension    Shoulder internal rotation    Shoulder external rotation    Middle trapezius    Lower trapezius    Elbow flexion 4 3+  Elbow extension 4 4  Wrist flexion 4 4  Wrist extension 4   Wrist ulnar deviation    Wrist radial deviation    Wrist pronation    Wrist supination    Grip strength     (Blank rows = not tested) LOWER EXTREMITY MMT:  Active MMT Right eval Left eval Right 02/26/24: Left  02/26/24:  Hip flexion 3 3 3  extension lag 3+ extension lag  Hip extension   2+ 2+  Hip abduction 3+ 3+ 3 3  Hip adduction 3 3    Hip internal rotation      Hip external rotation      Knee flexion 4 (129) 4- (120) 4 3+  Knee extension 4 (8 degrees from neutral) 4 (13 degrees from neutral) 4+ 4+  Ankle dorsiflexion 4+ 4 4 4   Ankle plantarflexion      Ankle inversion      Ankle eversion       (Blank rows = not tested)  FUNCTIONAL TESTS:  5 times sit to stand: 12.19s  2 minute walk test: 384 feet, abnormal pattern  02/26/24: 459ft with SPC, abnormal pattern  GAIT: Findings: Gait Characteristics: decreased arm swing- Left, decreased stance time- Left, decreased stride length, ataxic, decreased trunk rotation, trunk flexed, and wide BOS, Distance walked: 400, Assistive device utilized:Single point cane, and Comments: pt demonstrates neurologic type gait pattern with spastic movements with LLE, decreased knee extension bilaterally, worse on left  TREATMENT DATE:  03/02/2024  Therapeutic Exercise: -Nustep, 5 minutes, level 7 resistance, pt cued for 70-80 spm -Monster walks, 2 laps 20 feet per lap, with  GTB around ankles, pt cued for upright posture and athletic stance -Double knees to chest, 2 sets of 10 reps, on green theraball, pt cued for max pain free ROM and smooth motion Neuromuscular Re-education: -Lower trunk rotations on green theraball, 1 set of 10 reps, bilaterally, pt cued to remain in pain free ROM -Tall kneeling on blue foam, shoulder extensions, black TB at head level, 2 set of 10 reps, pt cued for positioning -Bird dog, 1 set of 7 reps, bilaterally, pt cued for increased hip ROM and neutral spine throughout movement Therapeutic Activity: -Lateral stepping, 1 laps, 20 feet per lap, with GTB around ankles, pt cued for upright posture and slight bend in knees -Sit to stands with staggered stance (LLE back) with kettle bell at chest, 10 lb with overhead press, 2 sets of 5 reps, pt cued for core activation and keeping weight close to chest   02/26/24: 470 with SPC, abnormal pattern Reviewed goals MMT 5STS 10.35 cueing to stand erect ROM Rt 0-129  Lt 3-135   LEFS 49/80= 61%  02/19/2024  Therapeutic Exercise: -Nustep, 5 minutes, level 10 resistance, pt cued for 80 spm -Supine bridges with RTB at knees, 2 sets of 10 reps, 3 second holds, pt cued for max hip extension Neuromuscular Re-education: -Prone on elbows, 1 set of 2 reps,  60 second holds, pt cued for sequencing  -Side plank, 1 set of 3 reps of 10 second holds, bilaterally, pt cued for increased hip elevation and proper LE/UE placement -Bird dog, 1 set of 8 reps, bilaterally, pt cued for increased hip ROM and neutral spine throughout movement -Resisted walking with tidal tank at chest, lateral stepping, walking marches, 1 lap each variation 40 foot hallway  Therapeutic Activity: -Kettle bell swings, 1 sets of 10 reps, 10lb, pt cued for hip momentum and less UE strain      PATIENT EDUCATION:  Education details: Pt was educated on findings of PT evaluation, prognosis, frequency of therapy visits and rationale,  attendance policy, and HEP if given.   Person educated: Patient Education method: Explanation, Actor cues, and Handouts Education comprehension: verbalized understanding, verbal cues required, and needs further education  HOME EXERCISE PROGRAM: Access Code: [email or web address] URL: [email or web address] Date: 03/02/2024 Prepared by: Lang Ada  Exercises - Supine Bridge  - 1 x daily - 7 x weekly - 3 sets - 10 reps - Supine Active Straight Leg Raise  - 1 x daily - 7 x weekly - 3 sets - 10 reps - Sit to Stand with Arms Crossed  - 1 x daily - 7 x weekly - 3 sets - 10 reps - Tricep Dip from Chair  - 1 x daily - 7 x weekly - 3 sets - 10 reps - Standard Plank  - 1 x daily - 7 x weekly - 1 sets - 3 reps - 30 hold - Side Stepping with Resistance at Thighs  - 1 x daily - 7 x weekly - 3 sets - 10 reps - Forward Monster Walks  - 1 x daily - 7 x weekly - 3 sets - 10 reps  ASSESSMENT:  CLINICAL IMPRESSION: Patient continues to demonstrate decreased LE/core strength, decreased gait quality and balance. Patient also demonstrates increased endurance with aerobic based exercise during today's session. Patient able to progress dynamic balance and core activation exercises today  with tall kneeling activities and banded walks, good performance with verbal cueing. Patient would continue to benefit from skilled physical therapy for increased endurance with ambulation, increased LLE strength, and improved balance for improved quality of life, improved independence with gait training and continued progress towards therapy goals.    Eval: Patient is a 43 y.o. 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 Patient demonstrates low back pain, decreased LE strength, abnormal gait pattern, and impaired balance. Patient also demonstrates difficulty  with ambulation during today's session with decreased stride length and velocity noted, see above for further details on gait pattern. Patient also demonstrates inability to hold SLS for more than one second on BLE, possibly due to effects of stroke but will still set balance goal. Patient requires education on role of PT, importance of physical activity and HEP compliance. Patient would benefit from skilled physical therapy for increased endurance with ambulation, increased LE strength, and balance for improved gait quality, return to higher level of function with ADLs, and progress towards therapy goals.   OBJECTIVE IMPAIRMENTS: Abnormal gait, decreased activity tolerance, decreased balance, decreased coordination, decreased endurance, decreased mobility, difficulty walking, decreased ROM, decreased strength, hypomobility, impaired flexibility, and pain.   ACTIVITY LIMITATIONS: carrying, lifting, bending, standing, squatting, sleeping, stairs, transfers, and bed mobility  PARTICIPATION LIMITATIONS: meal prep, cleaning, laundry, driving, community activity, and yard work  PERSONAL FACTORS: Age, Fitness, Past/current experiences, Time since onset of injury/illness/exacerbation, and 1 comorbidity: hx of stroke/coma are also affecting patient's functional outcome.   REHAB POTENTIAL: Fair chronic in nature  CLINICAL DECISION MAKING: Stable/uncomplicated  EVALUATION COMPLEXITY: Low   GOALS: Goals reviewed with patient? No  SHORT TERM GOALS: Target date: 02/06/24  Patient will demonstrate evidence of independence with individualized HEP and will report compliance for at least 3 days per week for optimized progression towards remaining therapy goals. Baseline: 02/26/24:  Reports compliance with HEP daily. Goal status: MET  2.  Patient will report no more than one fall for next three weeks at home for increased safety and improved quality of life. Baseline: falls everyday; 02/26/24:  Reports 2  falls in the last 3 weeks due to knee buckling, able to get up on his own Goal status: IN PROGRESS     LONG TERM GOALS: Target date: 02/27/24  Pt will demonstrate a an increase of at least 9 points on the LEFS for improved performance of community ambulation and ADL. Baseline: see objective; 02/26/24:  Improved 12 points Goal status: MET  2.  Pt will improve 2 MWT by 50 feet in order to demonstrate improved functional ambulatory capacity in community setting.  Baseline: see objective; 02/26/24:  Increased by 57ft with Goal status: MET  3.  Pt will demonstrate increased bilateral knee extension during gait cycle for increased mobility and maximal efficiency of gait cycle during ambulation. Baseline: TBA, 02/26/24:  Improved ROM though continues to present with extension lag during MMT and gait Goal status: IN PROGRESS  4.  Pt will demonstrate at least 4/5 MMT for bilateral lower extremity for increased strength during ADL and community ambulation. Baseline: see objective Goal status: IN PROGRESS  5.  Pt will improve SLS by 3 seconds on BLE in order to improve balance during functional activities such as walking. Baseline: unable to hold more than 1 second; 02/26/24:  unable to hold more than 1  second Goal status: IN PROGRESS    PLAN:  PT FREQUENCY: 2x/week  PT DURATION: 6 weeks  PLANNED INTERVENTIONS: 97110-Therapeutic exercises, 97530- Therapeutic activity, 97112- Neuromuscular re-education, 97535- Self Care, [id number]- Manual therapy, [phone]- Gait training, Patient/Family education, Balance training, Stair training, Joint mobilization, Joint manipulation, DME instructions, Cryotherapy, and Moist heat  PLAN FOR NEXT SESSION: Assess bilaterally knee flexion/extension ROM, progress LE strengthening and balance training, gait training, extending cert to 68du.   Lang Ada, PT, DPT [facility] Office: [phone] 12:01 PM, 03/02/2024

## 2024-03-04 ENCOUNTER — Ambulatory Visit (HOSPITAL_COMMUNITY): Payer: MEDICAID

## 2024-03-04 ENCOUNTER — Telehealth (HOSPITAL_COMMUNITY): Payer: Self-pay

## 2024-03-04 NOTE — Telephone Encounter (Signed)
 Pt was called concerning his missed appointment this morning. Pt states he completely forgot he had an appointment and is still having difficulty with his transportation. Pt reminded of his next appointment next Tuesday and was told to call Monday to confirm. This is pts first no show.   Lang Ada, PT, DPT [facility] Office: [phone] 3:04 PM, 03/04/2024

## 2024-03-09 ENCOUNTER — Ambulatory Visit (HOSPITAL_COMMUNITY): Payer: MEDICAID

## 2024-03-09 ENCOUNTER — Encounter (HOSPITAL_COMMUNITY): Payer: Self-pay

## 2024-03-09 DIAGNOSIS — R269 Unspecified abnormalities of gait and mobility: Secondary | ICD-10-CM

## 2024-03-09 DIAGNOSIS — Z7409 Other reduced mobility: Secondary | ICD-10-CM

## 2024-03-09 DIAGNOSIS — R29898 Other symptoms and signs involving the musculoskeletal system: Secondary | ICD-10-CM

## 2024-03-09 NOTE — Therapy (Signed)
 " OUTPATIENT PHYSICAL THERAPY LOWER EXTREMITY TREATMENT   Patient Name: [name] MRN: [id number] DOB:1980-05-17, 43 y.o., male Today's Date: 03/09/2024  END OF SESSION:  PT End of Session - 03/09/24 1057     Visit Number 5    Number of Visits 12    Date for Recertification  03/17/24    Authorization Type [employer]    Authorization Time Period 12 approved from 01/19/24-03/17/24    Authorization - Visit Number 4    Authorization - Number of Visits 12    Progress Note Due on Visit 10    PT Start Time 1057    PT Stop Time 1139    PT Time Calculation (min) 42 min    Activity Tolerance Patient tolerated treatment well    Behavior During Therapy WFL for tasks assessed/performed             Past Medical History:  Diagnosis Date   Depression    Encephalitis    Hypertension    Meningitis    Paralysis, 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   Stroke [facility])    Past Surgical History:  Procedure Laterality Date   I & D EXTREMITY Left 01/12/2020   Procedure: EXCISION DEBRIDEMENT LEFT TIBIAL TUBERCLE, PLACE ANTIBIOTIC BEADS;  Surgeon: Harden Jerona GAILS, MD;  Location: [facility] OR;  Service: Orthopedics;  Laterality: Left;   JOINT REPLACEMENT     KNEE SURGERY     TIBIA OSTEOTOMY Left 07/16/2023   Procedure: OSTEOTOMY, TIBIA;  Surgeon: Harden Jerona GAILS, MD;  Location: [facility] OR;  Service: Orthopedics;  Laterality: Left;  PARTIAL EXCISION LEFT TIBIA   Patient Active Problem List   Diagnosis Date Noted   Osteomyelitis of left tibia (HCC) 07/16/2023   History of stroke 07/10/2023   Ulcer of left lower leg, with necrosis of bone (HCC)    Sepsis (HCC)    Primary hypertension    History of encephalitis 06/23/2019   Leukocytosis 11/17/2018   Osteomyelitis (HCC) 11/16/2018   HCAP (healthcare-associated pneumonia) 06/28/2013   Chest pain, atypical 06/28/2013   SIRS (systemic inflammatory response syndrome) (HCC) 06/28/2013   Hyponatremia  06/28/2013   Headache 01/05/2013   Leg pain 01/05/2013   Dizziness 01/05/2013   ALCOHOL ABUSE, IN REMISSION 06/10/2007   CIGARETTE SMOKER 06/10/2007   Depression 06/10/2007   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   HEMIPLEGIA, SPASTIC, NONDOMINANT SIDE 06/10/2007   POSTTRAUMATIC WOUND INFECTION NEC 06/10/2007   Tobacco dependence syndrome 06/10/2007    PCP: Jolee Madelin Patch, MD   REFERRING PROVIDER: Harden Jerona GAILS, MD  REFERRING DIAG: [phone] (ICD-10-CM) - 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 He is not hurting any today.   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 Pt states he is dealing with low back pain that is constant and feels this is causing all of the issues with legs. Pt states his lower body is weak. Pt state he has been disabled since the incident in 1995. Pt presents with SPC, has been using it for 23 years. Pt states something happened in 2003 after he was drinking too much and has  had 13 surgeries on LLE. Pt states he tries to do some exercises at home to keep strength up. Pt also states he has some good neighbors that are willing to help him if he needs.   PERTINENT HISTORY: 13 surgeries on LLE Hx of stroke Hx of coma PAIN:  Are you having pain? Yes: NPRS scale: 0/10 Pain location: low back Pain description: constant, ache Aggravating factors: sitting up, standing in one spot for more than a minute Relieving factors: laying down  PRECAUTIONS: Fall  RED FLAGS: None   WEIGHT BEARING RESTRICTIONS: No  FALLS:  Has patient fallen in last 6 months? Yes. Number of  falls way over 10 falls  LIVING ENVIRONMENT: Lives with: lives with an adult companion Lives in: House/apartment Stairs: Yes: External: 13 steps; on right going up and bilateral but cannot reach both Pt has been living there 13 years Has following equipment at home: Single point cane  OCCUPATION: disabled  PLOF: Independent, Independent with basic ADLs, and Requires assistive device for independence  PATIENT GOALS: likes a challenge, increased LE strength, walk better, decrease low back pain  NEXT MD VISIT: December 2nd  OBJECTIVE:  Note: Objective measures were completed at Evaluation unless otherwise noted.  DIAGNOSTIC FINDINGS: CLINICAL DATA:  Open wound involving the anterior aspect of the proximal tibia.   EXAM: MRI OF LOWER LEFT EXTREMITY WITHOUT AND WITH CONTRAST   TECHNIQUE: Multiplanar, multisequence MR imaging of the left lower extremity was performed both before and after administration of intravenous contrast.   CONTRAST:  7mL GADAVIST  GADOBUTROL  1 MMOL/ML IV SOLN   COMPARISON:  Radiographs 06/22/2023.  Prior MRI from 2020   FINDINGS: Chronic open wound involving the anterior aspect of the upper tibial region just below the knee joint. This involves the distal aspect of the patellar tendon which demonstrates significant septic tendinopathy and longitudinal split type tear/central defect.   Abnormal T1 and T2 signal intensity in the proximal tibia along the tibial tubercle with subsequent contrast enhancement consistent with osteomyelitis. There is also extensive enhancing granulation tissue around the wound without discrete drainable soft tissue abscess.   Chronic changes involving the patella possibly related to prior large osteochondral lesion, bone infarct or infection.   No findings suspicious for septic arthritis involving the knee joint. No findings for internal derangement.   IMPRESSION: 1. Chronic open wound involving the anterior aspect of the  upper tibial region just below the knee joint. This involves the distal aspect of the patellar tendon which demonstrates significant septic tendinopathy and longitudinal split type tear/central defect. 2. Osteomyelitis involving the proximal tibia along the tibial tubercle. 3. Extensive enhancing granulation tissue around without discrete drainable soft tissue abscess. 4. Chronic changes involving the patella possibly related to prior large osteochondral lesion, bone infarct or infection. 5. No findings suspicious for septic arthritis involving the knee joint.  PATIENT SURVEYS:  LEFS :  37 / 80 = 46.3 % LEFS  Extreme difficulty/unable (0), Quite a bit of difficulty (1), Moderate difficulty (2), Little difficulty (3), No difficulty (4) Survey date:   02/26/24:  Any of your usual work, housework or school activities 3  2. Usual hobbies, recreational or sporting activities 4  3. Getting into/out of the bath 1  4. Walking between rooms 2  5. Putting on socks/shoes 4  6. Squatting  0  7. Lifting an object, like a bag of groceries from the floor 4  8. Performing light activities around your home 4  9. Performing heavy activities around  your home 3  10. Getting into/out of a car 4  11. Walking 2 blocks 1  12. Walking 1 mile 0  13. Going up/down 10 stairs (1 flight) 3  14. Standing for 1 hour 0  15.  sitting for 1 hour 4  16. Running on even ground 4  17. Running on uneven ground 4  18. Making sharp turns while running fast 0  19. Hopping  0  20. Rolling over in bed 4  Score total:  49/80= 61%      COGNITION: Overall cognitive status: Within functional limits for tasks assessed     SENSATION: WFL  COORDINATION: Coordination limited on LUE with rapid alternating movement with supination and pronation   POSTURE: rounded shoulders, forward head, and flexed trunk   PALPATION: N/A UPPER EXTREMITY MMT:  MMT Right eval Left eval  Shoulder flexion    Shoulder extension     Shoulder abduction 4- 3+  Shoulder adduction    Shoulder extension    Shoulder internal rotation    Shoulder external rotation    Middle trapezius    Lower trapezius    Elbow flexion 4 3+  Elbow extension 4 4  Wrist flexion 4 4  Wrist extension 4   Wrist ulnar deviation    Wrist radial deviation    Wrist pronation    Wrist supination    Grip strength     (Blank rows = not tested) LOWER EXTREMITY MMT:  Active MMT Right eval Left eval Right 02/26/24: Left  02/26/24:  Hip flexion 3 3 3  extension lag 3+ extension lag  Hip extension   2+ 2+  Hip abduction 3+ 3+ 3 3  Hip adduction 3 3    Hip internal rotation      Hip external rotation      Knee flexion 4 (129) 4- (120) 4 3+  Knee extension 4 (8 degrees from neutral) 4 (13 degrees from neutral) 4+ 4+  Ankle dorsiflexion 4+ 4 4 4   Ankle plantarflexion      Ankle inversion      Ankle eversion       (Blank rows = not tested)  FUNCTIONAL TESTS:  5 times sit to stand: 12.19s  2 minute walk test: 384 feet, abnormal pattern  02/26/24: 427ft with SPC, abnormal pattern  GAIT: Findings: Gait Characteristics: decreased arm swing- Left, decreased stance time- Left, decreased stride length, ataxic, decreased trunk rotation, trunk flexed, and wide BOS, Distance walked: 400, Assistive device utilized:Single point cane, and Comments: pt demonstrates neurologic type gait pattern with spastic movements with LLE, decreased knee extension bilaterally, worse on left                                                                                                                                TREATMENT DATE:  03/09/24 EXERCISE LOG  Exercise Repetitions and Resistance Comments  Nustep  L7-8 x 5 minutes    Lower trunk rotation   2 minutes    Double knee to chest   2 minutes    Bridge   20 reps    SLR   20 reps each    Seated hip ADD isometric  2.5 minutes w/ 5 second hold    Seated HS curl  GTB x  20 reps each    Sit to stand  20 reps  From lowered mat table   Seated clams  GTB x 20 reps     Blank cell = exercise not performed today   03/02/2024  Therapeutic Exercise: -Nustep, 5 minutes, level 7 resistance, pt cued for 70-80 spm -Monster walks, 2 laps 20 feet per lap, with GTB around ankles, pt cued for upright posture and athletic stance -Double knees to chest, 2 sets of 10 reps, on green theraball, pt cued for max pain free ROM and smooth motion Neuromuscular Re-education: -Lower trunk rotations on green theraball, 1 set of 10 reps, bilaterally, pt cued to remain in pain free ROM -Tall kneeling on blue foam, shoulder extensions, black TB at head level, 2 set of 10 reps, pt cued for positioning -Bird dog, 1 set of 7 reps, bilaterally, pt cued for increased hip ROM and neutral spine throughout movement Therapeutic Activity: -Lateral stepping, 1 laps, 20 feet per lap, with GTB around ankles, pt cued for upright posture and slight bend in knees -Sit to stands with staggered stance (LLE back) with kettle bell at chest, 10 lb with overhead press, 2 sets of 5 reps, pt cued for core activation and keeping weight close to chest   02/26/24: 470 with SPC, abnormal pattern Reviewed goals MMT 5STS 10.35 cueing to stand erect ROM Rt 0-129  Lt 3-135   LEFS 49/80= 61%   PATIENT EDUCATION:  Education details: Pt was educated on findings of PT evaluation, prognosis, frequency of therapy visits and rationale, attendance policy, and HEP if given.   Person educated: Patient Education method: Explanation, Actor cues, and Handouts Education comprehension: verbalized understanding, verbal cues required, and needs further education  HOME EXERCISE PROGRAM: Access Code: [email or web address] URL: [email or web address] Date: 03/02/2024 Prepared by: Lang Ada  Exercises - Supine Bridge  - 1 x daily - 7 x weekly - 3 sets - 10 reps - Supine Active Straight Leg Raise  - 1 x daily - 7  x weekly - 3 sets - 10 reps - Sit to Stand with Arms Crossed  - 1 x daily - 7 x weekly - 3 sets - 10 reps - Tricep Dip from Chair  - 1 x daily - 7 x weekly - 3 sets - 10 reps - Standard Plank  - 1 x daily - 7 x weekly - 1 sets - 3 reps - 30 hold - Side Stepping with Resistance at Thighs  - 1 x daily - 7 x weekly - 3 sets - 10 reps - Forward Monster Walks  - 1 x daily - 7 x weekly - 3 sets - 10 reps  ASSESSMENT:  CLINICAL IMPRESSION: Patient was introduced to multiple new interventions for improved lower extremity strength with moderate difficulty and fatigue. He required minimal cueing with sit to stands to achieve full upright stance to promote gluteal engagement. He required brief seated rest breaks throughout treatment secondary to fatigue. He reported that his legs felt like Jell-O upon the conclusion of treatment. Patient continues  to require skilled physical therapy to address her remaining impairments to maximize his functional mobility.    Eval: Patient is a 43 y.o. 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 Patient demonstrates low back pain, decreased LE strength, abnormal gait pattern, and impaired balance. Patient also demonstrates difficulty with ambulation during today's session with decreased stride length and velocity noted, see above for further details on gait pattern. Patient also demonstrates inability to hold SLS for more than one second on BLE, possibly due to effects of stroke but will still set balance goal. Patient requires education on role of PT, importance of physical activity and HEP compliance. Patient would benefit from skilled physical therapy for increased endurance with ambulation, increased LE strength, and balance for improved gait quality, return to higher level of function with ADLs, and progress  towards therapy goals.   OBJECTIVE IMPAIRMENTS: Abnormal gait, decreased activity tolerance, decreased balance, decreased coordination, decreased endurance, decreased mobility, difficulty walking, decreased ROM, decreased strength, hypomobility, impaired flexibility, and pain.   ACTIVITY LIMITATIONS: carrying, lifting, bending, standing, squatting, sleeping, stairs, transfers, and bed mobility  PARTICIPATION LIMITATIONS: meal prep, cleaning, laundry, driving, community activity, and yard work  PERSONAL FACTORS: Age, Fitness, Past/current experiences, Time since onset of injury/illness/exacerbation, and 1 comorbidity: hx of stroke/coma are also affecting patient's functional outcome.   REHAB POTENTIAL: Fair chronic in nature  CLINICAL DECISION MAKING: Stable/uncomplicated  EVALUATION COMPLEXITY: Low   GOALS: Goals reviewed with patient? No  SHORT TERM GOALS: Target date: 02/06/24  Patient will demonstrate evidence of independence with individualized HEP and will report compliance for at least 3 days per week for optimized progression towards remaining therapy goals. Baseline: 02/26/24:  Reports compliance with HEP daily. Goal status: MET  2.  Patient will report no more than one fall for next three weeks at home for increased safety and improved quality of life. Baseline: falls everyday; 02/26/24:  Reports 2 falls in the last 3 weeks due to knee buckling, able to get up on his own Goal status: IN PROGRESS     LONG TERM GOALS: Target date: 02/27/24  Pt will demonstrate a an increase of at least 9 points on the LEFS for improved performance of community ambulation and ADL. Baseline: see objective; 02/26/24:  Improved 12 points Goal status: MET  2.  Pt will improve 2 MWT by 50 feet in order to demonstrate improved functional ambulatory capacity in community setting.  Baseline: see objective; 02/26/24:  Increased by 23ft with Goal status: MET  3.  Pt will demonstrate  increased bilateral knee extension during gait cycle for increased mobility and maximal efficiency of gait cycle during ambulation. Baseline: TBA, 02/26/24:  Improved ROM though continues to present with extension lag during MMT and gait Goal status: IN PROGRESS  4.  Pt will demonstrate at least 4/5 MMT for bilateral lower extremity for increased strength during ADL and community ambulation. Baseline: see objective Goal status: IN PROGRESS  5.  Pt will improve SLS by 3 seconds on BLE in order to improve balance during functional activities such as walking. Baseline: unable to hold more than 1 second; 02/26/24:  unable to hold more than 1 second Goal status: IN PROGRESS    PLAN:  PT FREQUENCY: 2x/week  PT DURATION: 6 weeks  PLANNED INTERVENTIONS: 97110-Therapeutic exercises, 97530- Therapeutic activity, [id number]- Neuromuscular re-education, 97535- Self Care, [id number]-  Manual therapy, [id number]- Gait training, Patient/Family education, Balance training, Stair training, Joint mobilization, Joint manipulation, DME instructions, Cryotherapy, and Moist heat  PLAN FOR NEXT SESSION: Assess bilaterally knee flexion/extension ROM, progress LE strengthening and balance training, gait training, extending cert to 68du.   Lacinda Fass, PT, DPT  12:08 PM, 03/09/2024     "

## 2024-03-16 ENCOUNTER — Encounter (HOSPITAL_COMMUNITY): Payer: Self-pay

## 2024-03-16 ENCOUNTER — Ambulatory Visit (HOSPITAL_COMMUNITY): Payer: MEDICAID

## 2024-03-16 DIAGNOSIS — Z7409 Other reduced mobility: Secondary | ICD-10-CM

## 2024-03-16 DIAGNOSIS — R29898 Other symptoms and signs involving the musculoskeletal system: Secondary | ICD-10-CM

## 2024-03-16 DIAGNOSIS — R269 Unspecified abnormalities of gait and mobility: Secondary | ICD-10-CM

## 2024-03-16 NOTE — Therapy (Addendum)
 " OUTPATIENT PHYSICAL THERAPY LOWER EXTREMITY TREATMENT Progress Note Reporting Period 02/26/24 to 03/16/24  See note below for Objective Data and Assessment of Progress/Goals.      Patient Name: [name] MRN: [id number] DOB:11/24/1980, 43 y.o., male Today's Date: 03/16/2024  END OF SESSION:  PT End of Session - 03/16/24 1116     Visit Number 6    Number of Visits 12    Date for Recertification  03/17/24    Authorization Type [employer]    Authorization Time Period 12 approved from 01/19/24-03/17/24    Authorization - Visit Number 5    Authorization - Number of Visits 12    Progress Note Due on Visit 10    PT Start Time 1117    PT Stop Time 1207    PT Time Calculation (min) 50 min    Activity Tolerance Patient tolerated treatment well    Behavior During Therapy [facility] for tasks assessed/performed             Past Medical History:  Diagnosis Date   Depression    Encephalitis    Hypertension    Meningitis    Paralysis, 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   Stroke [facility])    Past Surgical History:  Procedure Laterality Date   I & D EXTREMITY Left 01/12/2020   Procedure: EXCISION DEBRIDEMENT LEFT TIBIAL TUBERCLE, PLACE ANTIBIOTIC BEADS;  Surgeon: Harden Jerona GAILS, MD;  Location: [facility] OR;  Service: Orthopedics;  Laterality: Left;   JOINT REPLACEMENT     KNEE SURGERY     TIBIA OSTEOTOMY Left 07/16/2023   Procedure: OSTEOTOMY, TIBIA;  Surgeon: Harden Jerona GAILS, MD;  Location: [facility] OR;  Service: Orthopedics;  Laterality: Left;  PARTIAL EXCISION LEFT TIBIA   Patient Active Problem List   Diagnosis Date Noted   Osteomyelitis of left tibia (HCC) 07/16/2023   History of stroke 07/10/2023   Ulcer of left lower leg, with necrosis of bone (HCC)    Sepsis (HCC)    Primary hypertension    History of encephalitis 06/23/2019   Leukocytosis 11/17/2018   Osteomyelitis (HCC) 11/16/2018   HCAP (healthcare-associated pneumonia)  06/28/2013   Chest pain, atypical 06/28/2013   SIRS (systemic inflammatory response syndrome) (HCC) 06/28/2013   Hyponatremia 06/28/2013   Headache 01/05/2013   Leg pain 01/05/2013   Dizziness 01/05/2013   ALCOHOL ABUSE, IN REMISSION 06/10/2007   CIGARETTE SMOKER 06/10/2007   Depression 06/10/2007   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   HEMIPLEGIA, SPASTIC, NONDOMINANT SIDE 06/10/2007   POSTTRAUMATIC WOUND INFECTION NEC 06/10/2007   Tobacco dependence syndrome 06/10/2007    PCP: Jolee Madelin Patch, MD   REFERRING PROVIDER: Harden Jerona GAILS, MD  REFERRING DIAG: [phone] (ICD-10-CM) - 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 He is not hurting any today.  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 Pt states he is dealing with low back pain that is constant and feels this is causing all of the issues with legs. Pt states his lower body is weak. Pt state he has been disabled since  the incident in 1995. Pt presents with SPC, has been using it for 23 years. Pt states something happened in 2003 after he was drinking too much and has had 13 surgeries on LLE. Pt states he tries to do some exercises at home to keep strength up. Pt also states he has some good neighbors that are willing to help him if he needs.   PERTINENT HISTORY: 13 surgeries on LLE Hx of stroke Hx of coma PAIN:  Are you having pain? Yes: NPRS scale: 0/10 Pain location: low back Pain description: constant, ache Aggravating factors: sitting up, standing in one spot for more than a minute Relieving  factors: laying down  PRECAUTIONS: Fall  RED FLAGS: None   WEIGHT BEARING RESTRICTIONS: No  FALLS:  Has patient fallen in last 6 months? Yes. Number of falls way over 10 falls  LIVING ENVIRONMENT: Lives with: lives with an adult companion Lives in: House/apartment Stairs: Yes: External: 13 steps; on right going up and bilateral but cannot reach both Pt has been living there 13 years Has following equipment at home: Single point cane  OCCUPATION: disabled  PLOF: Independent, Independent with basic ADLs, and Requires assistive device for independence  PATIENT GOALS: likes a challenge, increased LE strength, walk better, decrease low back pain  NEXT MD VISIT: December 2nd  OBJECTIVE:  Note: Objective measures were completed at Evaluation unless otherwise noted.  DIAGNOSTIC FINDINGS: CLINICAL DATA:  Open wound involving the anterior aspect of the proximal tibia.   EXAM: MRI OF LOWER LEFT EXTREMITY WITHOUT AND WITH CONTRAST   TECHNIQUE: Multiplanar, multisequence MR imaging of the left lower extremity was performed both before and after administration of intravenous contrast.   CONTRAST:  7mL GADAVIST  GADOBUTROL  1 MMOL/ML IV SOLN   COMPARISON:  Radiographs 06/22/2023.  Prior MRI from 2020   FINDINGS: Chronic open wound involving the anterior aspect of the upper tibial region just below the knee joint. This involves the distal aspect of the patellar tendon which demonstrates significant septic tendinopathy and longitudinal split type tear/central defect.   Abnormal T1 and T2 signal intensity in the proximal tibia along the tibial tubercle with subsequent contrast enhancement consistent with osteomyelitis. There is also extensive enhancing granulation tissue around the wound without discrete drainable soft tissue abscess.   Chronic changes involving the patella possibly related to prior large osteochondral lesion, bone infarct or infection.   No findings suspicious  for septic arthritis involving the knee joint. No findings for internal derangement.   IMPRESSION: 1. Chronic open wound involving the anterior aspect of the upper tibial region just below the knee joint. This involves the distal aspect of the patellar tendon which demonstrates significant septic tendinopathy and longitudinal split type tear/central defect. 2. Osteomyelitis involving the proximal tibia along the tibial tubercle. 3. Extensive enhancing granulation tissue around without discrete drainable soft tissue abscess. 4. Chronic changes involving the patella possibly related to prior large osteochondral lesion, bone infarct or infection. 5. No findings suspicious for septic arthritis involving the knee joint.  PATIENT SURVEYS:  LEFS :  37 / 80 = 46.3 % LEFS  Extreme difficulty/unable (0), Quite a bit of difficulty (1), Moderate difficulty (2), Little difficulty (3), No difficulty (4) Survey date:   02/26/24: 03/16/24:  Any of your usual work, housework or school activities 3 4  2. Usual hobbies, recreational or sporting activities 4 4  3. Getting into/out of the bath 1 1  4. Walking between rooms 2 4  5. Putting on socks/shoes 4  4  6. Squatting  0 1  7. Lifting an object, like a bag of groceries from the floor 4 4  8. Performing light activities around your home 4 4  9. Performing heavy activities around your home 3 2  10. Getting into/out of a car 4 4  11. Walking 2 blocks 1 1  12. Walking 1 mile 0 1  13. Going up/down 10 stairs (1 flight) 3 4  14. Standing for 1 hour 0 0  15.  sitting for 1 hour 4 4  16. Running on even ground 4 4  17. Running on uneven ground 4 4  18. Making sharp turns while running fast 0 0  19. Hopping  0 1  20. Rolling over in bed 4 4  Score total:  49/80= 61%  55/80= 68%      COGNITION: Overall cognitive status: Within functional limits for tasks assessed     SENSATION: WFL  COORDINATION: Coordination limited on LUE with rapid  alternating movement with supination and pronation   POSTURE: rounded shoulders, forward head, and flexed trunk   PALPATION: N/A UPPER EXTREMITY MMT:  MMT Right eval Left eval  Shoulder flexion    Shoulder extension    Shoulder abduction 4- 3+  Shoulder adduction    Shoulder extension    Shoulder internal rotation    Shoulder external rotation    Middle trapezius    Lower trapezius    Elbow flexion 4 3+  Elbow extension 4 4  Wrist flexion 4 4  Wrist extension 4   Wrist ulnar deviation    Wrist radial deviation    Wrist pronation    Wrist supination    Grip strength     (Blank rows = not tested) LOWER EXTREMITY MMT:  Active MMT Right eval Left eval Right 02/26/24: Left  02/26/24: Right 03/16/24 LEft 03/16/24  Hip flexion 3 3 3  extension lag 3+ extension lag 3 extension lag 3+ extension lag  Hip extension   2+ 2+ 2+ 2+  Hip abduction 3+ 3+ 3 3 3 3   Hip adduction 3 3      Hip internal rotation        Hip external rotation        Knee flexion 4 (129) 4- (120) 4 3+ 4 4-  Knee extension 4 (8 degrees from neutral) 4 (13 degrees from neutral) 4+ 4+ 4  extension lag 4  extension lag  Ankle dorsiflexion 4+ 4 4 4  4+ 4+  Ankle plantarflexion        Ankle inversion        Ankle eversion         (Blank rows = not tested)  FUNCTIONAL TESTS:  5 times sit to stand: 12.19s  2 minute walk test: 384 feet, abnormal pattern  02/26/24: 475ft with SPC, abnormal pattern 03/16/24: 417ft with SPC, abnormal pattern  5STS: 18.90; 10.36  GAIT: Findings: Gait Characteristics: decreased arm swing- Left, decreased stance time- Left, decreased stride length, ataxic, decreased trunk rotation, trunk flexed, and wide BOS, Distance walked: 400, Assistive device utilized:Single point cane, and Comments: pt demonstrates neurologic type gait pattern with spastic movements with LLE, decreased knee extension bilaterally, worse on left  TREATMENT DATE:  03/16/24: 421ft with SPC, abnormal pattern 5STS: 18.90; 10.36 MMT see above LEFS: 55/80= 68%  Supine:  Bridge with BTB around thigh 15x 5 Standing: Squats with cueing for mechanics 2x 10  Gait training to improve extension lag with heel strike x 49ft with improved mechanics, encouraged to slow down cadence to improve mechanics                                   03/09/24 EXERCISE LOG  Exercise Repetitions and Resistance Comments  Nustep  L7-8 x 5 minutes    Lower trunk rotation   2 minutes    Double knee to chest   2 minutes    Bridge   20 reps    SLR   20 reps each    Seated hip ADD isometric  2.5 minutes w/ 5 second hold    Seated HS curl  GTB x 20 reps each    Sit to stand  20 reps  From lowered mat table   Seated clams  GTB x 20 reps     Blank cell = exercise not performed today   03/02/2024  Therapeutic Exercise: -Nustep, 5 minutes, level 7 resistance, pt cued for 70-80 spm -Monster walks, 2 laps 20 feet per lap, with GTB around ankles, pt cued for upright posture and athletic stance -Double knees to chest, 2 sets of 10 reps, on green theraball, pt cued for max pain free ROM and smooth motion Neuromuscular Re-education: -Lower trunk rotations on green theraball, 1 set of 10 reps, bilaterally, pt cued to remain in pain free ROM -Tall kneeling on blue foam, shoulder extensions, black TB at head level, 2 set of 10 reps, pt cued for positioning -Bird dog, 1 set of 7 reps, bilaterally, pt cued for increased hip ROM and neutral spine throughout movement Therapeutic Activity: -Lateral stepping, 1 laps, 20 feet per lap, with GTB around ankles, pt cued for upright posture and slight bend in knees -Sit to stands with staggered stance (LLE back) with kettle bell at chest, 10 lb with overhead press, 2 sets of 5 reps, pt cued for core activation and keeping weight close to  chest   02/26/24: 470 with SPC, abnormal pattern Reviewed goals MMT 5STS 10.35 cueing to stand erect ROM Rt 0-129  Lt 3-135   LEFS 49/80= 61%   PATIENT EDUCATION:  Education details: Pt was educated on findings of PT evaluation, prognosis, frequency of therapy visits and rationale, attendance policy, and HEP if given.   Person educated: Patient Education method: Explanation, Actor cues, and Handouts Education comprehension: verbalized understanding, verbal cues required, and needs further education  HOME EXERCISE PROGRAM: Access Code: [email or web address] URL: [email or web address] Date: 03/02/2024 Prepared by: Lang Ada  Exercises - Supine Bridge  - 1 x daily - 7 x weekly - 3 sets - 10 reps - Supine Active Straight Leg Raise  - 1 x daily - 7 x weekly - 3 sets - 10 reps - Sit to Stand with Arms Crossed  - 1 x daily - 7 x weekly - 3 sets - 10 reps - Tricep Dip from Chair  - 1 x daily - 7 x weekly - 3 sets - 10 reps - Standard Plank  - 1 x daily - 7 x weekly - 1 sets - 3 reps - 30 hold - Side Stepping with Resistance at Thighs  - 1 x daily -  7 x weekly - 3 sets - 10 reps - Forward Monster Walks  - 1 x daily - 7 x weekly - 3 sets - 10 reps  03/16/24:  Add GTB around thighs with bridge  ASSESSMENT:  CLINICAL IMPRESSION: Reviewed goals with the following findings:  Pt continues to ambulate with abnormal pattern, antalgic mechanics with extension lag present bil knees, presents with weak hip musculature and difficulty with SLS.  Pt has had 2 PT treatments since last reviewed goals so minimal gains with MMT, and 5STS same.  Reviewed importance of HEP compliance, pt stated he has been compliant 4 days a week and does walking daily.  Pt with improved self perceived functional abilities with LEFS survey.  Pt will continue to benefit from skilled interventiont for 4 more weeks to address gait deficitis, strength and balance to reduce risk of falls in the  future.   Eval: Patient is a 43 y.o. 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 Patient demonstrates low back pain, decreased LE strength, abnormal gait pattern, and impaired balance. Patient also demonstrates difficulty with ambulation during today's session with decreased stride length and velocity noted, see above for further details on gait pattern. Patient also demonstrates inability to hold SLS for more than one second on BLE, possibly due to effects of stroke but will still set balance goal. Patient requires education on role of PT, importance of physical activity and HEP compliance. Patient would benefit from skilled physical therapy for increased endurance with ambulation, increased LE strength, and balance for improved gait quality, return to higher level of function with ADLs, and progress towards therapy goals.   OBJECTIVE IMPAIRMENTS: Abnormal gait, decreased activity tolerance, decreased balance, decreased coordination, decreased endurance, decreased mobility, difficulty walking, decreased ROM, decreased strength, hypomobility, impaired flexibility, and pain.   ACTIVITY LIMITATIONS: carrying, lifting, bending, standing, squatting, sleeping, stairs, transfers, and bed mobility  PARTICIPATION LIMITATIONS: meal prep, cleaning, laundry, driving, community activity, and yard work  PERSONAL FACTORS: Age, Fitness, Past/current experiences, Time since onset of injury/illness/exacerbation, and 1 comorbidity: hx of stroke/coma are also affecting patient's functional outcome.   REHAB POTENTIAL: Fair chronic in nature  CLINICAL DECISION MAKING: Stable/uncomplicated  EVALUATION COMPLEXITY: Low   GOALS: Goals reviewed with patient? No  SHORT TERM GOALS: Target date: 02/06/24  Patient will demonstrate evidence of  independence with individualized HEP and will report compliance for at least 3 days per week for optimized progression towards remaining therapy goals. Baseline: 02/26/24:  Reports compliance with HEP daily.; 03/16/24:  Reports compliance with HEP 4 days per week Goal status: MET  2.  Patient will report no more than one fall for next three weeks at home for increased safety and improved quality of life. Baseline: falls everyday; 02/26/24:  Reports 2 falls in the last 3 weeks due to knee buckling, able to get up on his own; 03/16/24:  No reports of falls since last assessed Goal status: MET     LONG TERM GOALS: Target date: 02/27/24  Pt will demonstrate a an increase of at least 9 points on the LEFS for improved performance of community ambulation and ADL. Baseline: see objective; 02/26/24:  Improved 12 points Goal status: MET  2.  Pt will improve 2 MWT by 50 feet in order to demonstrate improved functional ambulatory capacity in community setting.  Baseline: see objective; 02/26/24:  Increased by 73ft with Goal status: MET  3.  Pt will demonstrate increased bilateral knee extension during gait cycle for increased mobility and maximal efficiency of gait cycle during ambulation. Baseline: TBA, 02/26/24:  Improved ROM though continues to present with extension lag during MMT and gait; 03/16/24:  Continues to present with extension lag during gait Goal status: IN PROGRESS  4.  Pt will demonstrate at least 4/5 MMT for bilateral lower extremity for increased strength during ADL and community ambulation. Baseline: see objective Goal status: IN PROGRESS  5.  Pt will improve SLS by 3 seconds on BLE in order to improve balance during functional activities such as walking. Baseline: unable to hold more than 1 second; 02/26/24:  unable to hold more than 1 second; 03/16/24: unable to hold more than 1 second Goal status: IN PROGRESS    PLAN:  PT FREQUENCY: 2x/week  PT DURATION: 6  weeks  PLANNED INTERVENTIONS: 97110-Therapeutic exercises, 97530- Therapeutic activity, 97112- Neuromuscular re-education, 97535- Self Care, [id number]- Manual therapy, [phone]- Gait training, Patient/Family education, Balance training, Stair training, Joint mobilization, Joint manipulation, DME instructions, Cryotherapy, and Moist heat  PLAN FOR NEXT SESSION: Assess bilaterally knee flexion/extension ROM, progress LE strengthening and balance training, gait training.  Continue 4 more weeks to address goals unmet.  Focus with knee extension to improve gait mechanics, gluteal strengthening and balance training. Seeking additional auth due to dates expiring.  Augustin Mclean, LPTA/CLT; CBIS [phone]  1:04 PM, 03/16/2024  Lang Ada, PT, DPT [facility] Office: [phone] 8:58 AM, 03/17/2024   Managed Medicaid Authorization Request Treatment Start Date: 2024-04-18  Visit Dx Codes: R29.898; R26.9; Z74.09   Functional Tool Score: LEFS: 55/80= 68%   For all possible CPT codes, reference the Planned Interventions line above.     Check all conditions that are expected to impact treatment: {Conditions expected to impact treatment:Complications related to surgery and Unknown   If treatment provided at initial evaluation, no treatment charged due to lack of authorization.       "

## 2024-03-17 NOTE — Addendum Note (Signed)
 Addended by: Christpher Stogsdill on: 03/17/2024 09:00 AM   Modules accepted: Orders

## 2024-03-23 ENCOUNTER — Ambulatory Visit (HOSPITAL_COMMUNITY): Payer: MEDICAID | Attending: Orthopedic Surgery

## 2024-03-25 ENCOUNTER — Encounter (HOSPITAL_COMMUNITY): Payer: Self-pay

## 2024-03-25 ENCOUNTER — Ambulatory Visit (HOSPITAL_COMMUNITY): Payer: MEDICAID

## 2024-03-30 ENCOUNTER — Encounter (HOSPITAL_COMMUNITY): Payer: Self-pay

## 2024-03-30 ENCOUNTER — Ambulatory Visit (HOSPITAL_COMMUNITY): Payer: MEDICAID | Attending: Orthopedic Surgery

## 2024-03-30 DIAGNOSIS — Z7409 Other reduced mobility: Secondary | ICD-10-CM | POA: Diagnosis present

## 2024-03-30 DIAGNOSIS — R269 Unspecified abnormalities of gait and mobility: Secondary | ICD-10-CM | POA: Insufficient documentation

## 2024-03-30 DIAGNOSIS — R29898 Other symptoms and signs involving the musculoskeletal system: Secondary | ICD-10-CM | POA: Diagnosis present

## 2024-03-30 NOTE — Therapy (Signed)
 " OUTPATIENT PHYSICAL THERAPY LOWER EXTREMITY TREATMENT  Patient Name: [name] MRN: [id number] DOB:Aug 03, 1980, 44 y.o., male Today's Date: 03/30/2024  END OF SESSION:  PT End of Session - 03/30/24 1125     Visit Number 7    Number of Visits 12    Date for Recertification  03/17/24    Authorization Type [employer]    Authorization Time Period 12 approved from 03/19/24-04/30/24    Authorization - Visit Number 1    Authorization - Number of Visits 12    Progress Note Due on Visit 10    PT Start Time 1124    PT Stop Time 1157    PT Time Calculation (min) 33 min    Activity Tolerance Patient tolerated treatment well    Behavior During Therapy WFL for tasks assessed/performed              Past Medical History:  Diagnosis Date   Depression    Encephalitis    Hypertension    Meningitis    Paralysis, 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   Stroke [facility])    Past Surgical History:  Procedure Laterality Date   I & D EXTREMITY Left 01/12/2020   Procedure: EXCISION DEBRIDEMENT LEFT TIBIAL TUBERCLE, PLACE ANTIBIOTIC BEADS;  Surgeon: Harden Jerona GAILS, MD;  Location: [facility] OR;  Service: Orthopedics;  Laterality: Left;   JOINT REPLACEMENT     KNEE SURGERY     TIBIA OSTEOTOMY Left 07/16/2023   Procedure: OSTEOTOMY, TIBIA;  Surgeon: Harden Jerona GAILS, MD;  Location: [facility] OR;  Service: Orthopedics;  Laterality: Left;  PARTIAL EXCISION LEFT TIBIA   Patient Active Problem List   Diagnosis Date Noted   Osteomyelitis of left tibia (HCC) 07/16/2023   History of stroke 07/10/2023   Ulcer of left lower leg, with necrosis of bone (HCC)    Sepsis (HCC)    Primary hypertension    History of encephalitis 06/23/2019   Leukocytosis 11/17/2018   Osteomyelitis (HCC) 11/16/2018   HCAP (healthcare-associated pneumonia) 06/28/2013   Chest pain, atypical 06/28/2013   SIRS (systemic inflammatory response syndrome) (HCC) 06/28/2013   Hyponatremia 06/28/2013    Headache 01/05/2013   Leg pain 01/05/2013   Dizziness 01/05/2013   ALCOHOL ABUSE, IN REMISSION 06/10/2007   CIGARETTE SMOKER 06/10/2007   Depression 06/10/2007   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   HEMIPLEGIA, SPASTIC, NONDOMINANT SIDE 06/10/2007   POSTTRAUMATIC WOUND INFECTION NEC 06/10/2007   Tobacco dependence syndrome 06/10/2007    PCP: Jolee Madelin Patch, MD   REFERRING PROVIDER: Harden Jerona GAILS, MD  REFERRING DIAG: [phone] (ICD-10-CM) - 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 Pt states he is dealing with low back pain that is constant and feels this is causing all of the issues with legs. Pt states his lower body is weak. Pt state he has been disabled since the incident in 1995. Pt presents with SPC, has been using it for 23 years. Pt states something happened in 2003 after he was drinking too much and has had 13 surgeries on LLE. Pt  states he tries to do some exercises at home to keep strength up. Pt also states he has some good neighbors that are willing to help him if he needs.   PERTINENT HISTORY: 13 surgeries on LLE Hx of stroke Hx of coma PAIN:  Are you having pain? Yes: NPRS scale: 0/10 Pain location: low back Pain description: constant, ache Aggravating factors: sitting up, standing in one spot for more than a minute Relieving factors: laying down  PRECAUTIONS: Fall  RED FLAGS: None   WEIGHT BEARING RESTRICTIONS: No  FALLS:  Has patient fallen in last 6 months? Yes. Number of falls way over 10 falls  LIVING  ENVIRONMENT: Lives with: lives with an adult companion Lives in: House/apartment Stairs: Yes: External: 13 steps; on right going up and bilateral but cannot reach both Pt has been living there 13 years Has following equipment at home: Single point cane  OCCUPATION: disabled  PLOF: Independent, Independent with basic ADLs, and Requires assistive device for independence  PATIENT GOALS: likes a challenge, increased LE strength, walk better, decrease low back pain  NEXT MD VISIT: December 2nd  OBJECTIVE:  Note: Objective measures were completed at Evaluation unless otherwise noted.  DIAGNOSTIC FINDINGS: CLINICAL DATA:  Open wound involving the anterior aspect of the proximal tibia.   EXAM: MRI OF LOWER LEFT EXTREMITY WITHOUT AND WITH CONTRAST   TECHNIQUE: Multiplanar, multisequence MR imaging of the left lower extremity was performed both before and after administration of intravenous contrast.   CONTRAST:  7mL GADAVIST  GADOBUTROL  1 MMOL/ML IV SOLN   COMPARISON:  Radiographs 06/22/2023.  Prior MRI from 2020   FINDINGS: Chronic open wound involving the anterior aspect of the upper tibial region just below the knee joint. This involves the distal aspect of the patellar tendon which demonstrates significant septic tendinopathy and longitudinal split type tear/central defect.   Abnormal T1 and T2 signal intensity in the proximal tibia along the tibial tubercle with subsequent contrast enhancement consistent with osteomyelitis. There is also extensive enhancing granulation tissue around the wound without discrete drainable soft tissue abscess.   Chronic changes involving the patella possibly related to prior large osteochondral lesion, bone infarct or infection.   No findings suspicious for septic arthritis involving the knee joint. No findings for internal derangement.   IMPRESSION: 1. Chronic open wound involving the anterior aspect of the upper tibial region just below  the knee joint. This involves the distal aspect of the patellar tendon which demonstrates significant septic tendinopathy and longitudinal split type tear/central defect. 2. Osteomyelitis involving the proximal tibia along the tibial tubercle. 3. Extensive enhancing granulation tissue around without discrete drainable soft tissue abscess. 4. Chronic changes involving the patella possibly related to prior large osteochondral lesion, bone infarct or infection. 5. No findings suspicious for septic arthritis involving the knee joint.  PATIENT SURVEYS:  LEFS :  37 / 80 = 46.3 % LEFS  Extreme difficulty/unable (0), Quite a bit of difficulty (1), Moderate difficulty (2), Little difficulty (3), No difficulty (4) Survey date:   02/26/24: 03/16/24:  Any of your usual work, housework or school activities 3 4  2. Usual hobbies, recreational or sporting activities 4 4  3. Getting into/out of the bath 1 1  4. Walking between rooms 2 4  5. Putting on socks/shoes 4 4  6. Squatting  0 1  7. Lifting an object, like a bag of groceries from the floor 4 4  8. Performing light activities around your home 4 4  9. Performing  heavy activities around your home 3 2  10. Getting into/out of a car 4 4  11. Walking 2 blocks 1 1  12. Walking 1 mile 0 1  13. Going up/down 10 stairs (1 flight) 3 4  14. Standing for 1 hour 0 0  15.  sitting for 1 hour 4 4  16. Running on even ground 4 4  17. Running on uneven ground 4 4  18. Making sharp turns while running fast 0 0  19. Hopping  0 1  20. Rolling over in bed 4 4  Score total:  49/80= 61%  55/80= 68%      COGNITION: Overall cognitive status: Within functional limits for tasks assessed     SENSATION: WFL  COORDINATION: Coordination limited on LUE with rapid alternating movement with supination and pronation   POSTURE: rounded shoulders, forward head, and flexed trunk   PALPATION: N/A UPPER EXTREMITY MMT:  MMT Right eval Left eval  Shoulder  flexion    Shoulder extension    Shoulder abduction 4- 3+  Shoulder adduction    Shoulder extension    Shoulder internal rotation    Shoulder external rotation    Middle trapezius    Lower trapezius    Elbow flexion 4 3+  Elbow extension 4 4  Wrist flexion 4 4  Wrist extension 4   Wrist ulnar deviation    Wrist radial deviation    Wrist pronation    Wrist supination    Grip strength     (Blank rows = not tested) LOWER EXTREMITY MMT:  Active MMT Right eval Left eval Right 02/26/24: Left  02/26/24: Right 03/16/24 LEft 03/16/24  Hip flexion 3 3 3  extension lag 3+ extension lag 3 extension lag 3+ extension lag  Hip extension   2+ 2+ 2+ 2+  Hip abduction 3+ 3+ 3 3 3 3   Hip adduction 3 3      Hip internal rotation        Hip external rotation        Knee flexion 4 (129) 4- (120) 4 3+ 4 4-  Knee extension 4 (8 degrees from neutral) 4 (13 degrees from neutral) 4+ 4+ 4  extension lag 4  extension lag  Ankle dorsiflexion 4+ 4 4 4  4+ 4+  Ankle plantarflexion        Ankle inversion        Ankle eversion         (Blank rows = not tested)  FUNCTIONAL TESTS:  5 times sit to stand: 12.19s  2 minute walk test: 384 feet, abnormal pattern  02/26/24: 489ft with SPC, abnormal pattern 03/16/24: 463ft with SPC, abnormal pattern  5STS: 18.90; 10.36  GAIT: Findings: Gait Characteristics: decreased arm swing- Left, decreased stance time- Left, decreased stride length, ataxic, decreased trunk rotation, trunk flexed, and wide BOS, Distance walked: 400, Assistive device utilized:Single point cane, and Comments: pt demonstrates neurologic type gait pattern with spastic movements with LLE, decreased knee extension bilaterally, worse on left  TREATMENT DATE:                                    03/30/24 EXERCISE LOG  Exercise Repetitions and Resistance  Comments  Nustep  L7 x 5 minutes    Squatting   25 reps    Stepping over hurdles  3 laps  2 low hurdles and 1 high hurdle  Side stepping over hurdles  3 laps  2 low hurdles and 1 high hurdle   Seated HS curls  GTB x 25 reps each    Standing heel raise  20 reps  For lower extremity power     Blank cell = exercise not performed today   03/16/24: 425ft with SPC, abnormal pattern 5STS: 18.90; 10.36 MMT see above LEFS: 55/80= 68%  Supine:  Bridge with BTB around thigh 15x 5 Standing: Squats with cueing for mechanics 2x 10  Gait training to improve extension lag with heel strike x 67ft with improved mechanics, encouraged to slow down cadence to improve mechanics                                   03/09/24 EXERCISE LOG  Exercise Repetitions and Resistance Comments  Nustep  L7-8 x 5 minutes    Lower trunk rotation   2 minutes    Double knee to chest   2 minutes    Bridge   20 reps    SLR   20 reps each    Seated hip ADD isometric  2.5 minutes w/ 5 second hold    Seated HS curl  GTB x 20 reps each    Sit to stand  20 reps  From lowered mat table   Seated clams  GTB x 20 reps     Blank cell = exercise not performed today    PATIENT EDUCATION:  Education details: Pt was educated on findings of PT evaluation, prognosis, frequency of therapy visits and rationale, attendance policy, and HEP if given.   Person educated: Patient Education method: Explanation, Actor cues, and Handouts Education comprehension: verbalized understanding, verbal cues required, and needs further education  HOME EXERCISE PROGRAM: Access Code: [email or web address] URL: [email or web address] Date: 03/02/2024 Prepared by: Lang Ada  Exercises - Supine Bridge  - 1 x daily - 7 x weekly - 3 sets - 10 reps - Supine Active Straight Leg Raise  - 1 x daily - 7 x weekly - 3 sets - 10 reps - Sit to Stand with Arms Crossed  - 1 x daily - 7 x weekly - 3 sets - 10 reps - Tricep Dip from Chair  - 1 x daily  - 7 x weekly - 3 sets - 10 reps - Standard Plank  - 1 x daily - 7 x weekly - 1 sets - 3 reps - 30 hold - Side Stepping with Resistance at Thighs  - 1 x daily - 7 x weekly - 3 sets - 10 reps - Forward Monster Walks  - 1 x daily - 7 x weekly - 3 sets - 10 reps  03/16/24:  Add GTB around thighs with bridge  ASSESSMENT:  CLINICAL IMPRESSION: Patient arrived late to his appointment which limited his ability to be introduced to other new interventions. He required minimal verbal and tactile cueing with squatting for proper biomechanics to facilitate equal weight distrubution. He  fatigued quickly with today's standing interventions as he required multiple seated rest breaks throughout treatment. He reported feeling tired upon the conclusion of treatment. Patient continues to require skilled physical therapy to address her remaining impairments to maximize his functional mobility.   Eval: Patient is a 44 y.o. 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 Patient demonstrates low back pain, decreased LE strength, abnormal gait pattern, and impaired balance. Patient also demonstrates difficulty with ambulation during today's session with decreased stride length and velocity noted, see above for further details on gait pattern. Patient also demonstrates inability to hold SLS for more than one second on BLE, possibly due to effects of stroke but will still set balance goal. Patient requires education on role of PT, importance of physical activity and HEP compliance. Patient would benefit from skilled physical therapy for increased endurance with ambulation, increased LE strength, and balance for improved gait quality, return to higher level of function with ADLs, and progress towards therapy goals.   OBJECTIVE IMPAIRMENTS: Abnormal gait, decreased  activity tolerance, decreased balance, decreased coordination, decreased endurance, decreased mobility, difficulty walking, decreased ROM, decreased strength, hypomobility, impaired flexibility, and pain.   ACTIVITY LIMITATIONS: carrying, lifting, bending, standing, squatting, sleeping, stairs, transfers, and bed mobility  PARTICIPATION LIMITATIONS: meal prep, cleaning, laundry, driving, community activity, and yard work  PERSONAL FACTORS: Age, Fitness, Past/current experiences, Time since onset of injury/illness/exacerbation, and 1 comorbidity: hx of stroke/coma are also affecting patient's functional outcome.   REHAB POTENTIAL: Fair chronic in nature  CLINICAL DECISION MAKING: Stable/uncomplicated  EVALUATION COMPLEXITY: Low   GOALS: Goals reviewed with patient? No  SHORT TERM GOALS: Target date: 02/06/24  Patient will demonstrate evidence of independence with individualized HEP and will report compliance for at least 3 days per week for optimized progression towards remaining therapy goals. Baseline: 02/26/24:  Reports compliance with HEP daily.; 03/16/24:  Reports compliance with HEP 4 days per week Goal status: MET  2.  Patient will report no more than one fall for next three weeks at home for increased safety and improved quality of life. Baseline: falls everyday; 02/26/24:  Reports 2 falls in the last 3 weeks due to knee buckling, able to get up on his own; 03/16/24:  No reports of falls since last assessed Goal status: MET     LONG TERM GOALS: Target date: 02/27/24  Pt will demonstrate a an increase of at least 9 points on the LEFS for improved performance of community ambulation and ADL. Baseline: see objective; 02/26/24:  Improved 12 points Goal status: MET  2.  Pt will improve 2 MWT by 50 feet in order to demonstrate improved functional ambulatory capacity in community setting.  Baseline: see objective; 02/26/24:  Increased by 28ft with Goal status: MET  3.   Pt will demonstrate increased bilateral knee extension during gait cycle for increased mobility and maximal efficiency of gait cycle during ambulation. Baseline: TBA, 02/26/24:  Improved ROM though continues to present with extension lag during MMT and gait; 03/16/24:  Continues to present with extension lag during gait Goal status: IN PROGRESS  4.  Pt will demonstrate at least 4/5 MMT for bilateral lower extremity for increased strength during ADL and community ambulation. Baseline: see objective Goal status: IN PROGRESS  5.  Pt will improve SLS by 3 seconds on BLE in order to improve balance during functional  activities such as walking. Baseline: unable to hold more than 1 second; 02/26/24:  unable to hold more than 1 second; 03/16/24: unable to hold more than 1 second Goal status: IN PROGRESS    PLAN:  PT FREQUENCY: 2x/week  PT DURATION: 6 weeks  PLANNED INTERVENTIONS: 97110-Therapeutic exercises, 97530- Therapeutic activity, 97112- Neuromuscular re-education, 97535- Self Care, 02859- Manual therapy, [id number]- Gait training, Patient/Family education, Balance training, Stair training, Joint mobilization, Joint manipulation, DME instructions, Cryotherapy, and Moist heat  PLAN FOR NEXT SESSION: Assess bilaterally knee flexion/extension ROM, progress LE strengthening and balance training, gait training.  Continue 4 more weeks to address goals unmet.  Focus with knee extension to improve gait mechanics, gluteal strengthening and balance training.  Lacinda Fass, PT, DPT  [facility] Office: [phone] 12:16 PM, 03/30/2024   "

## 2024-04-01 ENCOUNTER — Ambulatory Visit (HOSPITAL_COMMUNITY): Payer: MEDICAID

## 2024-04-06 ENCOUNTER — Ambulatory Visit (HOSPITAL_COMMUNITY): Payer: MEDICAID

## 2024-04-06 ENCOUNTER — Encounter (HOSPITAL_COMMUNITY): Payer: Self-pay

## 2024-04-08 ENCOUNTER — Ambulatory Visit (HOSPITAL_COMMUNITY): Payer: MEDICAID

## 2024-04-08 ENCOUNTER — Encounter (HOSPITAL_COMMUNITY): Payer: Self-pay

## 2024-04-12 ENCOUNTER — Emergency Department (HOSPITAL_COMMUNITY)
Admission: EM | Admit: 2024-04-12 | Discharge: 2024-04-12 | Disposition: A | Discharge status: Home / Self Care | Payer: MEDICAID | Attending: Emergency Medicine | Admitting: Emergency Medicine

## 2024-04-12 ENCOUNTER — Other Ambulatory Visit: Payer: Self-pay

## 2024-04-12 DIAGNOSIS — F419 Anxiety disorder, unspecified: Secondary | ICD-10-CM | POA: Insufficient documentation

## 2024-04-12 MED ORDER — HYDROXYZINE HCL 25 MG PO TABS: 25.0000 mg | ORAL_TABLET | Freq: Four times a day (QID) | ORAL | 0 refills | Status: AC | PRN

## 2024-04-12 NOTE — Discharge Instructions (Addendum)
 Return for any problem.   You have been provided with a prescription for Atarax .  You can use this as needed if you feel very anxious.  It is important to touch base with your regular care providers in the outpatient setting.  Appropriate outpatient management of your anxiety is essential.

## 2024-04-12 NOTE — ED Provider Notes (Signed)
 " [place] EMERGENCY DEPARTMENT AT [facility] Provider Note   CSN: [id number] Arrival date & time: 04/12/24  9244     Patient presents with: Panic Attack   [name] is a 44 y.o. male.   44 year old male with prior medical history as detailed below presents for evaluation.  Patient reports that he is feeling anxious today.  He reports that he is upset because he his mother  has stage III breast cancer.  Patient appears to be calm.  He denies any alcohol or drug use this morning.  He was seen on 14 January by his outpatient care team for his generalized anxiety disorder.  At the time of that visit he reported increased anxiety related to the recent death of his cousin.  He reportedly was taking Cymbalta.  Today he reports that he does not take Cymbalta.  He did not report any issues regarding a cousin or the death of a cousin.  He denies SI, HI, indication for IVC.  The history is provided by the patient and medical records.       Prior to Admission medications  Medication Sig Start Date End Date Taking? Authorizing Provider  amlodipine -benazepril  (LOTREL) 2.5-10 MG capsule Take 1 capsule by mouth daily. 11/13/21   [provider]  hydrOXYzine  (ATARAX ) 25 MG tablet Take 1 tablet (25 mg total) by mouth every 6 (six) hours as needed for anxiety. 04/12/24  Yes Laurice Maude BROCKS, MD  ibuprofen  (ADVIL ) 200 MG tablet Take 200-400 mg by mouth every 6 (six) hours as needed for moderate pain (pain score 4-6).    [provider]  levofloxacin  (LEVAQUIN ) 500 MG tablet Take 1 tablet (500 mg total) by mouth daily. 09/01/23   Gerome Maurilio HERO, PA-C  oxyCODONE -acetaminophen  (PERCOCET/ROXICET) 5-325 MG tablet Take 1 tablet by mouth every 4 (four) hours as needed. 07/19/23   Harden Jerona GAILS, MD    Allergies: Ciprofloxacin, Hydrocodone -acetaminophen , Penicillins, Propoxyphene n-acetaminophen , and Tramadol hcl    Review of Systems  All other systems reviewed and are  negative.   Updated Vital Signs BP (!) 163/132 (BP Location: Right Arm)   Pulse (!) 118   Temp 98.5 F (36.9 C) (Oral)   Resp 18   Ht 6' 1 (1.854 m)   Wt 74.4 kg   SpO2 100%   BMI 21.64 kg/m   Physical Exam Vitals and nursing note reviewed.  Constitutional:      General: He is not in acute distress.    Appearance: He is well-developed.  HENT:     Head: Normocephalic and atraumatic.  Eyes:     Conjunctiva/sclera: Conjunctivae normal.  Cardiovascular:     Rate and Rhythm: Normal rate and regular rhythm.     Heart sounds: No murmur heard. Pulmonary:     Effort: Pulmonary effort is normal. No respiratory distress.     Breath sounds: Normal breath sounds.  Abdominal:     Palpations: Abdomen is soft.     Tenderness: There is no abdominal tenderness.  Musculoskeletal:        General: No swelling.     Cervical back: Neck supple.  Skin:    General: Skin is warm and dry.     Capillary Refill: Capillary refill takes less than 2 seconds.  Neurological:     Mental Status: He is alert.  Psychiatric:        Mood and Affect: Mood normal.     (all labs ordered are listed, but only abnormal results are  displayed) Labs Reviewed - No data to display  EKG: None  Radiology: No results found.   Procedures   Medications Ordered in the ED - No data to display                                  Medical Decision Making Patient presents with complaint of anxiety.  This appears to be a longstanding issue with this patient.  He is without indication for IVC.  Patient drove himself here.  He is driving home.  The inclement winter weather has created an unsafe driving situation with very slushy icy roads.  He does not appear to need acute administration of medication at this time.  Patient is advised that he can fill a prescription for Atarax  for home use over the next several days if needed.  He was advised to follow-up closely with his regular outpatient care providers for  further treatment of his generalized anxiety.  Risk Prescription drug management.        Final diagnoses:  Anxiety    ED Discharge Orders          Ordered    hydrOXYzine  (ATARAX ) 25 MG tablet  Every 6 hours PRN        04/12/24 0818               Laurice Maude BROCKS, MD 04/12/24 [phone]  "

## 2024-04-12 NOTE — ED Triage Notes (Signed)
 Patient here for eval of panic attack he is having due to just finding out his mother has stage three breast cancer. States he is feeling very nervous. Also had coffee before arriving to ED.

## 2024-04-13 ENCOUNTER — Ambulatory Visit (HOSPITAL_COMMUNITY): Payer: MEDICAID

## 2024-04-13 ENCOUNTER — Encounter (HOSPITAL_COMMUNITY): Payer: Self-pay

## 2024-04-13 DIAGNOSIS — R269 Unspecified abnormalities of gait and mobility: Secondary | ICD-10-CM

## 2024-04-13 DIAGNOSIS — R29898 Other symptoms and signs involving the musculoskeletal system: Secondary | ICD-10-CM

## 2024-04-13 DIAGNOSIS — Z7409 Other reduced mobility: Secondary | ICD-10-CM

## 2024-04-13 NOTE — Therapy (Signed)
 " OUTPATIENT PHYSICAL THERAPY LOWER EXTREMITY TREATMENT  Patient Name: BLISS TSANG MRN: [id number] DOB:[birth year], 44 y.o., male Today's Date: 04/13/2024  END OF SESSION:  PT End of Session - 04/13/24 1116     Visit Number 8    Number of Visits 12    Date for Recertification  04/30/24    Authorization Type [employer]    Authorization Time Period 12 approved from 03/19/24-04/30/24    Authorization - Visit Number 2    Authorization - Number of Visits 12    Progress Note Due on Visit 10    PT Start Time 1116    PT Stop Time 1155    PT Time Calculation (min) 39 min    Activity Tolerance Patient tolerated treatment well    Behavior During Therapy WFL for tasks assessed/performed               Past Medical History:  Diagnosis Date   Depression    Encephalitis    Hypertension    Meningitis    Paralysis, 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   Stroke [facility])    Past Surgical History:  Procedure Laterality Date   I & D EXTREMITY Left 01/12/2020   Procedure: EXCISION DEBRIDEMENT LEFT TIBIAL TUBERCLE, PLACE ANTIBIOTIC BEADS;  Surgeon: Harden Jerona GAILS, MD;  Location: [facility] OR;  Service: Orthopedics;  Laterality: Left;   JOINT REPLACEMENT     KNEE SURGERY     TIBIA OSTEOTOMY Left 07/16/2023   Procedure: OSTEOTOMY, TIBIA;  Surgeon: Harden Jerona GAILS, MD;  Location: [facility] OR;  Service: Orthopedics;  Laterality: Left;  PARTIAL EXCISION LEFT TIBIA   Patient Active Problem List   Diagnosis Date Noted   Osteomyelitis of left tibia (HCC) 07/16/2023   History of stroke 07/10/2023   Ulcer of left lower leg, with necrosis of bone (HCC)    Sepsis (HCC)    Primary hypertension    History of encephalitis 06/23/2019   Leukocytosis 11/17/2018   Osteomyelitis (HCC) 11/16/2018   HCAP (healthcare-associated pneumonia) 06/28/2013   Chest pain, atypical 06/28/2013   SIRS (systemic inflammatory response syndrome) (HCC) 06/28/2013   Hyponatremia 06/28/2013    Headache 01/05/2013   Leg pain 01/05/2013   Dizziness 01/05/2013   ALCOHOL ABUSE, IN REMISSION 06/10/2007   CIGARETTE SMOKER 06/10/2007   Depression 06/10/2007   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   HEMIPLEGIA, SPASTIC, NONDOMINANT SIDE 06/10/2007   POSTTRAUMATIC WOUND INFECTION NEC 06/10/2007   Tobacco dependence syndrome 06/10/2007    PCP: Jolee Madelin Patch, MD   REFERRING PROVIDER: Harden Jerona GAILS, MD  REFERRING DIAG: [phone] (ICD-10-CM) - 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  Pt states he is doing HEP 4 days per week.   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 Pt states he is dealing with low back pain that is constant and feels this is causing all of the issues with legs. Pt states his lower body is weak. Pt state he has been disabled since the incident in 1995. Pt presents with SPC, has been using it for 23 years. Pt states  something happened in 2003 after he was drinking too much and has had 13 surgeries on LLE. Pt states he tries to do some exercises at home to keep strength up. Pt also states he has some good neighbors that are willing to help him if he needs.   PERTINENT HISTORY: 13 surgeries on LLE Hx of stroke Hx of coma PAIN:  Are you having pain? Yes: NPRS scale: 0/10 Pain location: low back Pain description: constant, ache Aggravating factors: sitting up, standing in one spot for more than a minute Relieving factors: laying down  PRECAUTIONS: Fall  RED FLAGS: None   WEIGHT BEARING RESTRICTIONS: No  FALLS:  Has patient fallen in last  6 months? Yes. Number of falls way over 10 falls  LIVING ENVIRONMENT: Lives with: lives with an adult companion Lives in: House/apartment Stairs: Yes: External: 13 steps; on right going up and bilateral but cannot reach both Pt has been living there 13 years Has following equipment at home: Single point cane  OCCUPATION: disabled  PLOF: Independent, Independent with basic ADLs, and Requires assistive device for independence  PATIENT GOALS: likes a challenge, increased LE strength, walk better, decrease low back pain  NEXT MD VISIT: December 2nd  OBJECTIVE:  Note: Objective measures were completed at Evaluation unless otherwise noted.  DIAGNOSTIC FINDINGS: CLINICAL DATA:  Open wound involving the anterior aspect of the proximal tibia.   EXAM: MRI OF LOWER LEFT EXTREMITY WITHOUT AND WITH CONTRAST   TECHNIQUE: Multiplanar, multisequence MR imaging of the left lower extremity was performed both before and after administration of intravenous contrast.   CONTRAST:  7mL GADAVIST  GADOBUTROL  1 MMOL/ML IV SOLN   COMPARISON:  Radiographs 06/22/2023.  Prior MRI from 2020   FINDINGS: Chronic open wound involving the anterior aspect of the upper tibial region just below the knee joint. This involves the distal aspect of the patellar tendon which demonstrates significant septic tendinopathy and longitudinal split type tear/central defect.   Abnormal T1 and T2 signal intensity in the proximal tibia along the tibial tubercle with subsequent contrast enhancement consistent with osteomyelitis. There is also extensive enhancing granulation tissue around the wound without discrete drainable soft tissue abscess.   Chronic changes involving the patella possibly related to prior large osteochondral lesion, bone infarct or infection.   No findings suspicious for septic arthritis involving the knee joint. No findings for internal derangement.   IMPRESSION: 1. Chronic open wound involving  the anterior aspect of the upper tibial region just below the knee joint. This involves the distal aspect of the patellar tendon which demonstrates significant septic tendinopathy and longitudinal split type tear/central defect. 2. Osteomyelitis involving the proximal tibia along the tibial tubercle. 3. Extensive enhancing granulation tissue around without discrete drainable soft tissue abscess. 4. Chronic changes involving the patella possibly related to prior large osteochondral lesion, bone infarct or infection. 5. No findings suspicious for septic arthritis involving the knee joint.  PATIENT SURVEYS:  LEFS :  37 / 80 = 46.3 % LEFS  Extreme difficulty/unable (0), Quite a bit of difficulty (1), Moderate difficulty (2), Little difficulty (3), No difficulty (4) Survey date:   02/26/24: 03/16/24:  Any of your usual work, housework or school activities 3 4  2. Usual hobbies, recreational or sporting activities 4 4  3. Getting into/out of the bath 1 1  4. Walking between rooms 2 4  5. Putting on socks/shoes 4 4  6. Squatting  0 1  7. Lifting an object, like a bag of groceries  from the floor 4 4  8. Performing light activities around your home 4 4  9. Performing heavy activities around your home 3 2  10. Getting into/out of a car 4 4  11. Walking 2 blocks 1 1  12. Walking 1 mile 0 1  13. Going up/down 10 stairs (1 flight) 3 4  14. Standing for 1 hour 0 0  15.  sitting for 1 hour 4 4  16. Running on even ground 4 4  17. Running on uneven ground 4 4  18. Making sharp turns while running fast 0 0  19. Hopping  0 1  20. Rolling over in bed 4 4  Score total:  49/80= 61%  55/80= 68%      COGNITION: Overall cognitive status: Within functional limits for tasks assessed     SENSATION: WFL  COORDINATION: Coordination limited on LUE with rapid alternating movement with supination and pronation   POSTURE: rounded shoulders, forward head, and flexed trunk   PALPATION: N/A UPPER  EXTREMITY MMT:  MMT Right eval Left eval  Shoulder flexion    Shoulder extension    Shoulder abduction 4- 3+  Shoulder adduction    Shoulder extension    Shoulder internal rotation    Shoulder external rotation    Middle trapezius    Lower trapezius    Elbow flexion 4 3+  Elbow extension 4 4  Wrist flexion 4 4  Wrist extension 4   Wrist ulnar deviation    Wrist radial deviation    Wrist pronation    Wrist supination    Grip strength     (Blank rows = not tested) LOWER EXTREMITY MMT:  Active MMT Right eval Left eval Right 02/26/24: Left  02/26/24: Right 03/16/24 LEft 03/16/24  Hip flexion 3 3 3  extension lag 3+ extension lag 3 extension lag 3+ extension lag  Hip extension   2+ 2+ 2+ 2+  Hip abduction 3+ 3+ 3 3 3 3   Hip adduction 3 3      Hip internal rotation        Hip external rotation        Knee flexion 4 (129) 4- (120) 4 3+ 4 4-  Knee extension 4 (8 degrees from neutral) 4 (13 degrees from neutral) 4+ 4+ 4  extension lag 4  extension lag  Ankle dorsiflexion 4+ 4 4 4  4+ 4+  Ankle plantarflexion        Ankle inversion        Ankle eversion         (Blank rows = not tested)  FUNCTIONAL TESTS:  5 times sit to stand: 12.19s  2 minute walk test: 384 feet, abnormal pattern  02/26/24: 416ft with SPC, abnormal pattern 03/16/24: 466ft with SPC, abnormal pattern  5STS: 18.90; 10.36  GAIT: Findings: Gait Characteristics: decreased arm swing- Left, decreased stance time- Left, decreased stride length, ataxic, decreased trunk rotation, trunk flexed, and wide BOS, Distance walked: 400, Assistive device utilized:Single point cane, and Comments: pt demonstrates neurologic type gait pattern with spastic movements with LLE, decreased knee extension bilaterally, worse on left  TREATMENT DATE:  04/13/2024  Therapeutic  Exercise: -Nustep, 5 minutes, level 7 resistance, pt cued for above 90 spm Neuromuscular Re-education: -Forward lunges, 1 set of 8 reps bilaterally, pt cued for decreased UE support in parallel bars and decreased knee valgus bilaterally -Lateral towel slide lunges, 1 set of 5 reps bilaterally, pt cued for BUE support for safety -Trampoline toss, 2 bouts of 10 throws, second set R foot on 8 inch step, blue weighted ball, pt cued for upright posture Therapeutic Activity: -Lateral step up and overs, 1 set of 8 reps bilaterally, 8 inch step, pt cued for upright posture and decreased UE support -Tidal tank walks, marching, lateral stepping, 2 laps marching, 1 lap lateral stepping, 20 foot lines, pt cued for increased LE ROM                                     03/30/24 EXERCISE LOG  Exercise Repetitions and Resistance Comments  Nustep  L7 x 5 minutes    Squatting   25 reps    Stepping over hurdles  3 laps  2 low hurdles and 1 high hurdle  Side stepping over hurdles  3 laps  2 low hurdles and 1 high hurdle   Seated HS curls  GTB x 25 reps each    Standing heel raise  20 reps  For lower extremity power     Blank cell = exercise not performed today   03/16/24: 453ft with SPC, abnormal pattern 5STS: 18.90; 10.36 MMT see above LEFS: 55/80= 68%  Supine:  Bridge with BTB around thigh 15x 5 Standing: Squats with cueing for mechanics 2x 10  Gait training to improve extension lag with heel strike x 25ft with improved mechanics, encouraged to slow down cadence to improve mechanics                                  PATIENT EDUCATION:  Education details: Pt was educated on findings of PT evaluation, prognosis, frequency of therapy visits and rationale, attendance policy, and HEP if given.   Person educated: Patient Education method: Explanation, Actor cues, and Handouts Education comprehension: verbalized understanding, verbal cues required, and needs further education  HOME EXERCISE  PROGRAM: Access Code: [email or web address] URL: [email or web address] Date: 03/02/2024 Prepared by: Lang Ada  Exercises - Supine Bridge  - 1 x daily - 7 x weekly - 3 sets - 10 reps - Supine Active Straight Leg Raise  - 1 x daily - 7 x weekly - 3 sets - 10 reps - Sit to Stand with Arms Crossed  - 1 x daily - 7 x weekly - 3 sets - 10 reps - Tricep Dip from Chair  - 1 x daily - 7 x weekly - 3 sets - 10 reps - Standard Plank  - 1 x daily - 7 x weekly - 1 sets - 3 reps - 30 hold - Side Stepping with Resistance at Thighs  - 1 x daily - 7 x weekly - 3 sets - 10 reps - Forward Monster Walks  - 1 x daily - 7 x weekly - 3 sets - 10 reps  03/16/24:  Add GTB around thighs with bridge  ASSESSMENT:  CLINICAL IMPRESSION: Patient continues to demonstrate increased pain in left knee, decreased LE strength, decreased gait quality and balance. Patient also demonstrates increased endurance with aerobic  based exercise during today's session with increased resistance on nustep. Patient able to progress dynamic balance and core activation exercises today with lunge variations and weighted ball toss, good performance with verbal cueing. Patient educated on importance of consistent HEP compliance and increase overall physical activity. Patient would continue to benefit from skilled physical therapy for decreased LE pain, increased endurance with ambulation, increased LE strength/ROM, and improved balance for improved quality of life, improved independence with community ambulation and continued progress towards therapy goals.    Eval: Patient is a 44 y.o. 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 Patient demonstrates low back pain, decreased LE strength, abnormal gait pattern, and impaired balance. Patient also demonstrates difficulty with  ambulation during today's session with decreased stride length and velocity noted, see above for further details on gait pattern. Patient also demonstrates inability to hold SLS for more than one second on BLE, possibly due to effects of stroke but will still set balance goal. Patient requires education on role of PT, importance of physical activity and HEP compliance. Patient would benefit from skilled physical therapy for increased endurance with ambulation, increased LE strength, and balance for improved gait quality, return to higher level of function with ADLs, and progress towards therapy goals.   OBJECTIVE IMPAIRMENTS: Abnormal gait, decreased activity tolerance, decreased balance, decreased coordination, decreased endurance, decreased mobility, difficulty walking, decreased ROM, decreased strength, hypomobility, impaired flexibility, and pain.   ACTIVITY LIMITATIONS: carrying, lifting, bending, standing, squatting, sleeping, stairs, transfers, and bed mobility  PARTICIPATION LIMITATIONS: meal prep, cleaning, laundry, driving, community activity, and yard work  PERSONAL FACTORS: Age, Fitness, Past/current experiences, Time since onset of injury/illness/exacerbation, and 1 comorbidity: hx of stroke/coma are also affecting patient's functional outcome.   REHAB POTENTIAL: Fair chronic in nature  CLINICAL DECISION MAKING: Stable/uncomplicated  EVALUATION COMPLEXITY: Low   GOALS: Goals reviewed with patient? No  SHORT TERM GOALS: Target date: 02/06/24  Patient will demonstrate evidence of independence with individualized HEP and will report compliance for at least 3 days per week for optimized progression towards remaining therapy goals. Baseline: 02/26/24:  Reports compliance with HEP daily.; 03/16/24:  Reports compliance with HEP 4 days per week Goal status: MET  2.  Patient will report no more than one fall for next three weeks at home for increased safety and improved quality of  life. Baseline: falls everyday; 02/26/24:  Reports 2 falls in the last 3 weeks due to knee buckling, able to get up on his own; 03/16/24:  No reports of falls since last assessed Goal status: MET     LONG TERM GOALS: Target date: 02/27/24  Pt will demonstrate a an increase of at least 9 points on the LEFS for improved performance of community ambulation and ADL. Baseline: see objective; 02/26/24:  Improved 12 points Goal status: MET  2.  Pt will improve 2 MWT by 50 feet in order to demonstrate improved functional ambulatory capacity in community setting.  Baseline: see objective; 02/26/24:  Increased by 61ft with Goal status: MET  3.  Pt will demonstrate increased bilateral knee extension during gait cycle for increased mobility and maximal efficiency of gait cycle during ambulation. Baseline: TBA, 02/26/24:  Improved ROM though continues to present with extension lag during MMT and gait; 03/16/24:  Continues to present with extension lag during gait Goal status: IN PROGRESS  4.  Pt will demonstrate at least 4/5 MMT for bilateral lower extremity for increased strength during ADL and community ambulation. Baseline: see objective Goal status: IN PROGRESS  5.  Pt will improve SLS by 3 seconds on BLE in order to improve balance during functional activities such as walking. Baseline: unable to hold more than 1 second; 02/26/24:  unable to hold more than 1 second; 03/16/24: unable to hold more than 1 second Goal status: IN PROGRESS    PLAN:  PT FREQUENCY: 2x/week  PT DURATION: 6 weeks  PLANNED INTERVENTIONS: 97110-Therapeutic exercises, 97530- Therapeutic activity, 97112- Neuromuscular re-education, 97535- Self Care, [id number]- Manual therapy, [phone]- Gait training, Patient/Family education, Balance training, Stair training, Joint mobilization, Joint manipulation, DME instructions, Cryotherapy, and Moist heat  PLAN FOR NEXT SESSION: Assess bilaterally knee flexion/extension ROM,  progress LE strengthening and balance training, gait training.  Continue 4 more weeks to address goals unmet.  Focus with knee extension to improve gait mechanics, gluteal strengthening and balance training. Extending cert to match insurance auth  Lacinda Fass, PT, DPT  [facility] Office: [phone] 12:03 PM, 04/13/24   "

## 2024-04-15 ENCOUNTER — Ambulatory Visit (HOSPITAL_COMMUNITY): Payer: MEDICAID

## 2024-04-15 ENCOUNTER — Encounter (HOSPITAL_COMMUNITY): Payer: Self-pay

## 2024-04-15 DIAGNOSIS — R269 Unspecified abnormalities of gait and mobility: Secondary | ICD-10-CM

## 2024-04-15 DIAGNOSIS — Z7409 Other reduced mobility: Secondary | ICD-10-CM

## 2024-04-15 DIAGNOSIS — R29898 Other symptoms and signs involving the musculoskeletal system: Secondary | ICD-10-CM | POA: Diagnosis not present
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
## 2024-04-20 ENCOUNTER — Ambulatory Visit: Payer: MEDICAID | Admitting: Orthopedic Surgery

## 2024-04-22 ENCOUNTER — Ambulatory Visit (HOSPITAL_COMMUNITY)
Admission: EM | Admit: 2024-04-22 | Discharge: 2024-04-22 | Disposition: A | Discharge status: Home / Self Care | Payer: MEDICAID | Source: Home / Self Care

## 2024-04-22 ENCOUNTER — Encounter (HOSPITAL_COMMUNITY): Payer: Self-pay

## 2024-04-22 DIAGNOSIS — S39012A Strain of muscle, fascia and tendon of lower back, initial encounter: Secondary | ICD-10-CM | POA: Diagnosis not present

## 2024-04-22 MED ORDER — ACETAMINOPHEN 500 MG PO TABS: 500.0000 mg | ORAL_TABLET | Freq: Four times a day (QID) | ORAL | 0 refills | Status: AC | PRN

## 2024-04-22 MED ORDER — KETOROLAC TROMETHAMINE 30 MG/ML IJ SOLN
INTRAMUSCULAR | Status: AC
  Filled 2024-04-22: qty 1

## 2024-04-22 MED ORDER — IBUPROFEN 600 MG PO TABS: 600.0000 mg | ORAL_TABLET | Freq: Four times a day (QID) | ORAL | 0 refills | Status: AC | PRN

## 2024-04-22 MED ORDER — KETOROLAC TROMETHAMINE 30 MG/ML IJ SOLN
30.0000 mg | Freq: Once | INTRAMUSCULAR | Status: AC
  Administered 2024-04-22: 30 mg via INTRAMUSCULAR
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
## 2024-04-22 NOTE — Discharge Instructions (Addendum)
 You were seen today for back pain.  You were given a medication in clinic to help relieve your pain and help with swelling. Do not take any ibuprofen , motrin , or aleve  for 24 hours. I have sent prescription strength ibuprofen  to your pharmacy that you may start in 24 hours. In the mean time you may use acetaminophen /tylenol .  If your symptoms do not improve over the next couple of days I recommend following up with your primary care provider as he may need some physical therapy to help with your back pain/strain
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
## 2024-04-27 ENCOUNTER — Ambulatory Visit: Payer: MEDICAID | Admitting: Orthopedic Surgery

## 2024-04-28 ENCOUNTER — Ambulatory Visit (HOSPITAL_COMMUNITY): Payer: MEDICAID | Attending: Orthopedic Surgery | Admitting: Physical Therapy

## 2024-04-30 ENCOUNTER — Ambulatory Visit (HOSPITAL_COMMUNITY): Payer: MEDICAID
